# Patient Record
Sex: Female | Born: 1975 | Race: White | Hispanic: No | Marital: Married | Smoking: Former smoker
Health system: Southern US, Community
[De-identification: ages and names within clinical notes are randomized; demographics above are authoritative.]

## PROBLEM LIST (undated history)

## (undated) DIAGNOSIS — M67919 Unspecified disorder of synovium and tendon, unspecified shoulder: Secondary | ICD-10-CM

## (undated) DIAGNOSIS — F1010 Alcohol abuse, uncomplicated: Secondary | ICD-10-CM

## (undated) HISTORY — PX: ANKLE SURGERY: SHX546

## (undated) HISTORY — DX: Unspecified disorder of synovium and tendon, unspecified shoulder: M67.919

## (undated) HISTORY — PX: PARACENTESIS: SHX844

---

## 2019-09-13 ENCOUNTER — Ambulatory Visit: Payer: BLUE CROSS/BLUE SHIELD

## 2019-09-18 ENCOUNTER — Ambulatory Visit: Payer: BLUE CROSS/BLUE SHIELD

## 2020-08-09 DIAGNOSIS — L409 Psoriasis, unspecified: Secondary | ICD-10-CM | POA: Insufficient documentation

## 2020-08-14 DIAGNOSIS — K7460 Unspecified cirrhosis of liver: Secondary | ICD-10-CM

## 2020-08-14 DIAGNOSIS — R188 Other ascites: Secondary | ICD-10-CM | POA: Diagnosis present

## 2020-08-14 DIAGNOSIS — K7031 Alcoholic cirrhosis of liver with ascites: Secondary | ICD-10-CM | POA: Insufficient documentation

## 2020-08-14 DIAGNOSIS — K766 Portal hypertension: Secondary | ICD-10-CM

## 2020-08-14 DIAGNOSIS — K828 Other specified diseases of gallbladder: Secondary | ICD-10-CM | POA: Insufficient documentation

## 2020-08-14 HISTORY — DX: Unspecified cirrhosis of liver: K74.60

## 2020-08-14 HISTORY — DX: Portal hypertension: K76.6

## 2020-08-14 HISTORY — DX: Other ascites: R18.8

## 2020-08-15 DIAGNOSIS — E871 Hypo-osmolality and hyponatremia: Secondary | ICD-10-CM | POA: Diagnosis present

## 2020-08-15 HISTORY — DX: Hypo-osmolality and hyponatremia: E87.1

## 2020-08-15 LAB — HM HEPATITIS C SCREENING LAB: HM Hepatitis Screen: NEGATIVE

## 2020-08-25 DIAGNOSIS — F1020 Alcohol dependence, uncomplicated: Secondary | ICD-10-CM | POA: Insufficient documentation

## 2020-09-20 DIAGNOSIS — D638 Anemia in other chronic diseases classified elsewhere: Secondary | ICD-10-CM | POA: Insufficient documentation

## 2020-10-12 DIAGNOSIS — D696 Thrombocytopenia, unspecified: Secondary | ICD-10-CM | POA: Insufficient documentation

## 2020-10-12 DIAGNOSIS — D72829 Elevated white blood cell count, unspecified: Secondary | ICD-10-CM | POA: Diagnosis present

## 2020-10-29 ENCOUNTER — Observation Stay (HOSPITAL_COMMUNITY)
Admission: EM | Admit: 2020-10-29 | Discharge: 2020-10-30 | Disposition: A | Discharge status: Home / Self Care | Payer: Managed Care, Other (non HMO) | Attending: Internal Medicine | Admitting: Internal Medicine

## 2020-10-29 ENCOUNTER — Emergency Department (HOSPITAL_COMMUNITY): Payer: Managed Care, Other (non HMO)

## 2020-10-29 ENCOUNTER — Encounter (HOSPITAL_COMMUNITY): Payer: Self-pay | Admitting: Emergency Medicine

## 2020-10-29 ENCOUNTER — Other Ambulatory Visit: Payer: Self-pay

## 2020-10-29 DIAGNOSIS — Z9889 Other specified postprocedural states: Secondary | ICD-10-CM

## 2020-10-29 DIAGNOSIS — E876 Hypokalemia: Secondary | ICD-10-CM | POA: Diagnosis not present

## 2020-10-29 DIAGNOSIS — R0789 Other chest pain: Principal | ICD-10-CM | POA: Diagnosis present

## 2020-10-29 DIAGNOSIS — J90 Pleural effusion, not elsewhere classified: Secondary | ICD-10-CM | POA: Diagnosis not present

## 2020-10-29 DIAGNOSIS — K7460 Unspecified cirrhosis of liver: Secondary | ICD-10-CM | POA: Diagnosis not present

## 2020-10-29 DIAGNOSIS — I10 Essential (primary) hypertension: Secondary | ICD-10-CM | POA: Diagnosis not present

## 2020-10-29 DIAGNOSIS — K766 Portal hypertension: Secondary | ICD-10-CM | POA: Diagnosis present

## 2020-10-29 DIAGNOSIS — Z885 Allergy status to narcotic agent status: Secondary | ICD-10-CM | POA: Diagnosis not present

## 2020-10-29 DIAGNOSIS — D7589 Other specified diseases of blood and blood-forming organs: Secondary | ICD-10-CM | POA: Diagnosis present

## 2020-10-29 DIAGNOSIS — Z79899 Other long term (current) drug therapy: Secondary | ICD-10-CM | POA: Diagnosis not present

## 2020-10-29 DIAGNOSIS — R188 Other ascites: Secondary | ICD-10-CM | POA: Diagnosis present

## 2020-10-29 DIAGNOSIS — Z20822 Contact with and (suspected) exposure to covid-19: Secondary | ICD-10-CM | POA: Insufficient documentation

## 2020-10-29 DIAGNOSIS — D72829 Elevated white blood cell count, unspecified: Secondary | ICD-10-CM | POA: Diagnosis present

## 2020-10-29 DIAGNOSIS — E871 Hypo-osmolality and hyponatremia: Secondary | ICD-10-CM | POA: Diagnosis not present

## 2020-10-29 DIAGNOSIS — R0600 Dyspnea, unspecified: Secondary | ICD-10-CM

## 2020-10-29 DIAGNOSIS — R0602 Shortness of breath: Secondary | ICD-10-CM | POA: Diagnosis present

## 2020-10-29 HISTORY — DX: Other specified diseases of blood and blood-forming organs: D75.89

## 2020-10-29 HISTORY — DX: Alcohol abuse, uncomplicated: F10.10

## 2020-10-29 LAB — PHOSPHORUS: Phosphorus: 2.2 mg/dL — ABNORMAL LOW (ref 2.5–4.6)

## 2020-10-29 LAB — PROTIME-INR
INR: 1.5 — ABNORMAL HIGH (ref 0.8–1.2)
Prothrombin Time: 18 seconds — ABNORMAL HIGH (ref 11.4–15.2)

## 2020-10-29 LAB — COMPREHENSIVE METABOLIC PANEL
ALT: 20 U/L (ref 0–44)
AST: 60 U/L — ABNORMAL HIGH (ref 15–41)
Albumin: 3.2 g/dL — ABNORMAL LOW (ref 3.5–5.0)
Alkaline Phosphatase: 104 U/L (ref 38–126)
Anion gap: 12 (ref 5–15)
BUN: 8 mg/dL (ref 6–20)
CO2: 25 mmol/L (ref 22–32)
Calcium: 9.4 mg/dL (ref 8.9–10.3)
Chloride: 92 mmol/L — ABNORMAL LOW (ref 98–111)
Creatinine, Ser: 0.68 mg/dL (ref 0.44–1.00)
GFR, Estimated: >60 mL/min (ref >60)
Glucose, Bld: 126 mg/dL — ABNORMAL HIGH (ref 70–99)
Potassium: 3.3 mmol/L — ABNORMAL LOW (ref 3.5–5.1)
Sodium: 129 mmol/L — ABNORMAL LOW (ref 135–145)
Total Bilirubin: 10.9 mg/dL — ABNORMAL HIGH (ref 0.3–1.2)
Total Protein: 8.1 g/dL (ref 6.5–8.1)

## 2020-10-29 LAB — TROPONIN I (HIGH SENSITIVITY)
Troponin I (High Sensitivity): 2 ng/L (ref <18)
Troponin I (High Sensitivity): <2 ng/L (ref <18)

## 2020-10-29 LAB — CBC
HCT: 37 % (ref 36.0–46.0)
Hemoglobin: 12.8 g/dL (ref 12.0–15.0)
MCH: 37.3 pg — ABNORMAL HIGH (ref 26.0–34.0)
MCHC: 34.6 g/dL (ref 30.0–36.0)
MCV: 107.9 fL — ABNORMAL HIGH (ref 80.0–100.0)
Platelets: 150 10*3/uL (ref 150–400)
RBC: 3.43 MIL/uL — ABNORMAL LOW (ref 3.87–5.11)
RDW: 13.6 % (ref 11.5–15.5)
WBC: 14.6 10*3/uL — ABNORMAL HIGH (ref 4.0–10.5)
nRBC: 0 % (ref 0.0–0.2)

## 2020-10-29 LAB — I-STAT BETA HCG BLOOD, ED (MC, WL, AP ONLY): I-stat hCG, quantitative: <5 m[IU]/mL (ref <5)

## 2020-10-29 LAB — LIPASE, BLOOD: Lipase: 32 U/L (ref 11–51)

## 2020-10-29 LAB — MAGNESIUM: Magnesium: 1.1 mg/dL — ABNORMAL LOW (ref 1.7–2.4)

## 2020-10-29 MED ORDER — POTASSIUM CHLORIDE CRYS ER 10 MEQ PO TBCR: 10.0000 meq | EXTENDED_RELEASE_TABLET | Freq: Every day | ORAL | Status: DC

## 2020-10-29 MED ORDER — FUROSEMIDE 20 MG PO TABS
20.0000 mg | ORAL_TABLET | Freq: Every day | ORAL | Status: DC
  Administered 2020-10-29: 20 mg via ORAL
  Filled 2020-10-29: qty 1

## 2020-10-29 MED ORDER — MAGNESIUM SULFATE 4 GM/100ML IV SOLN
4.0000 g | Freq: Once | INTRAVENOUS | Status: AC
  Administered 2020-10-29: 4 g via INTRAVENOUS
  Filled 2020-10-29: qty 100

## 2020-10-29 MED ORDER — SPIRONOLACTONE 25 MG PO TABS
100.0000 mg | ORAL_TABLET | Freq: Every day | ORAL | Status: DC
  Administered 2020-10-29: 100 mg via ORAL
  Filled 2020-10-29: qty 4

## 2020-10-29 MED ORDER — HYDROXYZINE HCL 25 MG PO TABS: 25.0000 mg | ORAL_TABLET | Freq: Four times a day (QID) | ORAL | Status: DC | PRN

## 2020-10-29 MED ORDER — POTASSIUM CHLORIDE CRYS ER 20 MEQ PO TBCR
40.0000 meq | EXTENDED_RELEASE_TABLET | Freq: Once | ORAL | Status: AC
  Administered 2020-10-29: 40 meq via ORAL
  Filled 2020-10-29: qty 2

## 2020-10-29 MED ORDER — IOHEXOL 350 MG/ML SOLN
100.0000 mL | Freq: Once | INTRAVENOUS | Status: AC | PRN
  Administered 2020-10-29: 100 mL via INTRAVENOUS

## 2020-10-29 MED ORDER — ONDANSETRON HCL 4 MG/2ML IJ SOLN: 4.0000 mg | Freq: Four times a day (QID) | INTRAMUSCULAR | Status: DC | PRN

## 2020-10-29 MED ORDER — FENTANYL CITRATE (PF) 100 MCG/2ML IJ SOLN: 25.0000 ug | INTRAMUSCULAR | Status: DC | PRN

## 2020-10-29 MED ORDER — K PHOS MONO-SOD PHOS DI & MONO 155-852-130 MG PO TABS
500.0000 mg | ORAL_TABLET | Freq: Once | ORAL | Status: AC
  Administered 2020-10-29: 500 mg via ORAL
  Filled 2020-10-29: qty 2

## 2020-10-29 NOTE — H&P (Signed)
History and Physical    Rebecca Bonilla UXN:[id number] DOB: 1976/05/16 DOA: 10/29/2020  PCP: Lilian Coma., MD   Patient coming from: Home.   I have personally briefly reviewed patient's old medical records in [place]  Chief Complaint: Chest pain.  HPI: Rebecca Bonilla is a 45 y.o. female with medical history significant of liver cirrhosis diagnosed 2 months ago due to alcohol abuse, hyponatremia, macrocytosis, portal hypertension who is coming to the emergency department referred from the urgent care center where she went due to pleuritic, no radiated chest pain associated with nonproductive cough and dyspnea on exertion since Wednesday.  The patient denies fever or chills, but states she had night sweats last night (not sure if she was just warm).  Her appetite has not changed and is actually improved from 2 months ago.  She denies palpitations, dizziness, PND, orthopnea or lower extremities pitting edema.  She has chronic RUQ pain, occasional nausea, but denies emesis, diarrhea, constipation, melena or hematochezia.  Her urine is amber looking, but no gross hematuria, dysuria, frequency or oliguria.  Denies polyuria, polydipsia, polyphagia or blurred vision.  Since she was diagnosed with cirrhosis, the patient states that she has had 2 therapeutic paracentesis.  ED Course: Initial vital signs were temperature 98.2 F, pulse 120, respiration 18, BP 115/80 mmHg and O2 sat 99% on room air.  Lab work: CBC showed a white count of 14.6, hemoglobin 12.8 g/dL with a MCV of 107.9 fL and platelets of 150.  PT 1.5 and INR 18.Troponin x2 normal.  Sodium 129, potassium 3.3, chloride 92 and CO2 25 mmol/L.  Glucose 126 mg/dL.  Normal BUN and creatinine total protein is 8.1 and albumin 3.2 g/dL.  AST was 60 units/L.  Normal ALT and alk phos.  Total bilirubin increased from 8.8 to 8.9 mg/dL to 10.9 mg/dL.  Lipase was 32 units/L.  Calcium was 9.4, magnesium was 1.1 and phosphorus 2.2  mg/dL.  Imaging: CTA chest showed no PE, but there was a large left pleural effusion with associated atelectasis.  CT abdomen/pelvis with contrast showed cirrhosis with sequela of portal hypertension including moderate volume ascites.  Please see images and full radiology report for further detail.  Review of Systems: As per HPI otherwise all other systems reviewed and are negative.   Past Medical History:  Diagnosis Date  . Alcohol abuse   . Cirrhosis of liver with ascites ([place]) 08/14/2020  . Hyponatremia 08/15/2020  . Macrocytosis 10/29/2020  . Portal hypertension ([place]) 08/14/2020    Past Surgical History:  Procedure Laterality Date  . ANKLE SURGERY     x 2    Social History  reports that she has never smoked. She has never used smokeless tobacco. She reports previous alcohol use. She reports that she does not use drugs.  Allergies  Allergen Reactions  . Hydrocodone Nausea And Vomiting    Family History  Problem Relation Age of Onset  . Huntington's disease Mother    Prior to Admission medications   Medication Sig Start Date End Date Taking? Authorizing Provider  ergocalciferol (VITAMIN D2) 1.25 MG (50000 UT) capsule Take 50,000 Units by mouth once a week.   Yes [provider]  furosemide (LASIX) 20 MG tablet Take 20 mg by mouth.   Yes [provider]  potassium chloride (KLOR-CON) 10 MEQ tablet Take 10 mEq by mouth daily.   Yes [provider]  spironolactone (ALDACTONE) 100 MG tablet Take 100 mg by mouth daily.   Yes  [provider]   Physical Exam: Vitals:   10/29/20 1730 10/29/20 1745 10/29/20 1800 10/29/20 1815  BP:  121/83 110/71 114/77  Pulse: (!) 110 (!) 112 (!) 108 (!) 107  Resp: 16 (!) 23 (!) 21 20  Temp:      TempSrc:      SpO2: 96% 98% 98% 98%  Weight:      Height:       Constitutional: Looks chronically ill, but in NAD, calm, comfortable. Eyes: PERRL, mildly injected lids and conjunctivae normal.  Icteric  sclerae. ENMT: Mucous membranes are moist. Posterior pharynx clear of any exudate or lesions. Neck: normal, supple, no masses, no thyromegaly Respiratory: Decreased breath sounds on left middle and lower lung fields, no wheezing, no crackles. Normal respiratory effort. No accessory muscle use.  Cardiovascular: Tachycardic with a regular rhythm 104 bpm, no murmurs / rubs / gallops. No extremity edema. 2+ pedal pulses. No carotid bruits.  Abdomen: Moderate ascites.  Bowel sounds positive.  Positive RUQ tenderness, no guarding or rebound, no masses palpated. No hepatosplenomegaly. Musculoskeletal: Mild generalized weakness.  No clubbing / cyanosis. Good ROM, no contractures. Normal muscle tone.  Skin: Positive icterus.  Some excoriations on her neck and extremities. Neurologic: CN 2-12 grossly intact. Sensation intact, DTR normal. Strength 5/5 in all 4.  Psychiatric: Normal judgment and insight. Alert and oriented x 3. Normal mood.   Labs on Admission: I have personally reviewed following labs and imaging studies  CBC: Recent Labs  Lab 10/29/20 1620  WBC 14.6*  HGB 12.8  HCT 37.0  MCV 107.9*  PLT 098    Basic Metabolic Panel: Recent Labs  Lab 10/29/20 1642  NA 129*  K 3.3*  CL 92*  CO2 25  GLUCOSE 126*  BUN 8  CREATININE 0.68  CALCIUM 9.4    GFR: Estimated Creatinine Clearance: 88.6 mL/min (by C-G formula based on SCr of 0.68 mg/dL).  Liver Function Tests: Recent Labs  Lab 10/29/20 1642  AST 60*  ALT 20  ALKPHOS 104  BILITOT 10.9*  PROT 8.1  ALBUMIN 3.2*    Urine analysis: No results found for: COLORURINE, APPEARANCEUR, LABSPEC, PHURINE, GLUCOSEU, HGBUR, BILIRUBINUR, KETONESUR, PROTEINUR, UROBILINOGEN, NITRITE, LEUKOCYTESUR  Radiological Exams on Admission: CT Angio Chest PE W/Cm &/Or Wo Cm  Result Date: 10/29/2020 CLINICAL DATA:  Cough, abdominal distension, jaundice. EXAM: CT ANGIOGRAPHY CHEST CT ABDOMEN AND PELVIS WITH CONTRAST TECHNIQUE: Multidetector CT  imaging of the chest was performed using the standard protocol during bolus administration of intravenous contrast. Multiplanar CT image reconstructions and MIPs were obtained to evaluate the vascular anatomy. Multidetector CT imaging of the abdomen and pelvis was performed using the standard protocol during bolus administration of intravenous contrast. CONTRAST:  162m OMNIPAQUE IOHEXOL 350 MG/ML SOLN COMPARISON:  None. FINDINGS: CTA CHEST FINDINGS Cardiovascular: Satisfactory opacification of the pulmonary arteries to the segmental level. No evidence of pulmonary embolism. Normal heart size. No pericardial effusion. Mediastinum/Nodes: No enlarged mediastinal, hilar, or axillary lymph nodes. Thyroid gland, trachea, and esophagus demonstrate no significant findings. Lungs/Pleura: There is are large left pleural effusion with associated atelectasis. The right lung is clear with no pleural effusion. There is no pneumothorax. Musculoskeletal: No chest wall abnormality. No acute or significant osseous findings. Review of the MIP images confirms the above findings. CT ABDOMEN and PELVIS FINDINGS Hepatobiliary: The liver has a nodular surface contour. No focal liver abnormality is seen. No gallstones, gallbladder wall thickening, or biliary dilatation. Pancreas: Unremarkable. No pancreatic ductal dilatation or surrounding inflammatory changes.  Spleen: Borderline enlarged size without focal abnormality. Adrenals/Urinary Tract: Adrenal glands are unremarkable. Kidneys are normal, without renal calculi, focal lesion, or hydronephrosis. Bladder is empty. Stomach/Bowel: Stomach is within normal limits. No pericecal inflammatory changes are noted to suggest acute appendicitis. No evidence of bowel wall thickening, distention, or inflammatory changes. Vascular/Lymphatic: Portosystemic shunts are noted, including perisplenic and periumbilical collaterals. No enlarged abdominal or pelvic lymph nodes. Reproductive: Uterus and  bilateral adnexa are unremarkable. Other: There is moderate volume ascites. No abdominal wall hernia is identified. Musculoskeletal: No acute or significant osseous findings. Review of the MIP images confirms the above findings. IMPRESSION: 1. No evidence of pulmonary embolism. 2. Large left pleural effusion with associated atelectasis. 3. Cirrhosis and sequela of portal hypertension including moderate volume ascites. Electronically Signed   By: Zerita Boers M.D.   On: 10/29/2020 18:13   CT Abdomen Pelvis W Contrast  Result Date: 10/29/2020 CLINICAL DATA:  Cough, abdominal distension, jaundice. EXAM: CT ANGIOGRAPHY CHEST CT ABDOMEN AND PELVIS WITH CONTRAST TECHNIQUE: Multidetector CT imaging of the chest was performed using the standard protocol during bolus administration of intravenous contrast. Multiplanar CT image reconstructions and MIPs were obtained to evaluate the vascular anatomy. Multidetector CT imaging of the abdomen and pelvis was performed using the standard protocol during bolus administration of intravenous contrast. CONTRAST:  155m OMNIPAQUE IOHEXOL 350 MG/ML SOLN COMPARISON:  None. FINDINGS: CTA CHEST FINDINGS Cardiovascular: Satisfactory opacification of the pulmonary arteries to the segmental level. No evidence of pulmonary embolism. Normal heart size. No pericardial effusion. Mediastinum/Nodes: No enlarged mediastinal, hilar, or axillary lymph nodes. Thyroid gland, trachea, and esophagus demonstrate no significant findings. Lungs/Pleura: There is are large left pleural effusion with associated atelectasis. The right lung is clear with no pleural effusion. There is no pneumothorax. Musculoskeletal: No chest wall abnormality. No acute or significant osseous findings. Review of the MIP images confirms the above findings. CT ABDOMEN and PELVIS FINDINGS Hepatobiliary: The liver has a nodular surface contour. No focal liver abnormality is seen. No gallstones, gallbladder wall thickening, or  biliary dilatation. Pancreas: Unremarkable. No pancreatic ductal dilatation or surrounding inflammatory changes. Spleen: Borderline enlarged size without focal abnormality. Adrenals/Urinary Tract: Adrenal glands are unremarkable. Kidneys are normal, without renal calculi, focal lesion, or hydronephrosis. Bladder is empty. Stomach/Bowel: Stomach is within normal limits. No pericecal inflammatory changes are noted to suggest acute appendicitis. No evidence of bowel wall thickening, distention, or inflammatory changes. Vascular/Lymphatic: Portosystemic shunts are noted, including perisplenic and periumbilical collaterals. No enlarged abdominal or pelvic lymph nodes. Reproductive: Uterus and bilateral adnexa are unremarkable. Other: There is moderate volume ascites. No abdominal wall hernia is identified. Musculoskeletal: No acute or significant osseous findings. Review of the MIP images confirms the above findings. IMPRESSION: 1. No evidence of pulmonary embolism. 2. Large left pleural effusion with associated atelectasis. 3. Cirrhosis and sequela of portal hypertension including moderate volume ascites. Electronically Signed   By: TZerita BoersM.D.   On: 10/29/2020 18:13    EKG: Independently reviewed.  Vent. rate 116 BPM PR interval 125 ms QRS duration 83 ms QT/QTcB 340/473 ms P-R-T axes 65 43 29 Sinus tachycardia Paired ventricular premature complexes Consider right atrial enlargement Low voltage, precordial leads 12 Lead; [place]  Assessment/Plan Principal Problem:   Atypical chest pain In the setting of   Pleural effusion on left Observation/telemetry. Analgesics as needed (fentanyl 25 mcg IVP every 2 hours PRN) Zofran 4 mg IVP every 6 hours PRN. Incentive spirometry as tolerated. Consult IR in a.m. for imaging guided  thoracentesis. May need IR guided paracentesis as well.  Active Problems:   Cirrhosis of liver with ascites ([facility]) Continue furosemide 20 mg p.o. nightly. Continue  regular KCl supplementation. Continue spironolactone 100 mg p.o. nightly. Added hydroxyzine 25 mg PO q6 hr PRN for pruritus.    Portal hypertension ([place]) As above. Follow-up with hepatology.    Hyponatremia Due to liver cirrhosis with ascites/chronic diuretic use.    Hypokalemia  KCl 40 mEq p.o. x1 dose. Continue KCl 10 mEq p.o. nightly tomorrow. Supplement magnesium and potassium.    Hypomagnesemia Magnesium sulfate 2 g IVPB x1 dose.    Hypophosphatemia K-Phos 500 mg p.o. x1 dose. Follow-up potassium level as needed.    Leukocytosis Around baseline. No fever, chills or other active infection signs.    Macrocytosis Had normal R91 and folic acid level a month ago.   DVT prophylaxis: SCDs. Code Status:   Full code. Family Communication:   Disposition Plan:   Patient is from:  Home.  Anticipated DC to:  Home.  Anticipated DC date:  10/30/2020.  Anticipated DC barriers: Clinical status/consultant sign off. Consults called: Admission status:  Observation/telemetry.  Severity of Illness:  High severity due to new left-sided pleural effusion producing atelectasis, pleuritic chest pain, tachycardia and dyspnea on exertion.  The patient will need to remain in the hospital for symptoms management and therapeutic thoracentesis.    Reubin Milan MD [facility]  How to contact the [facility] Attending or Consulting provider [place] or covering provider during after hours [place], for this patient?   1. Check the care team in [facility] and look for a) attending/consulting [facility] provider listed and b) the [facility] team listed 2. Log into [email or web address] and use [facility]'s universal password to access. If you do not have the password, please contact the hospital operator. 3. Locate the [facility] provider you are looking for under [facility] and page to a number that you can be directly reached. 4. If you still have difficulty reaching the provider, please page the [facility] (Director on Call)  for the Hospitalists listed on amion for assistance.  10/29/2020, 7:45 PM   This document was prepared using Dragon voice recognition software and may contain some unintended transcription errors.

## 2020-10-29 NOTE — ED Provider Notes (Signed)
[facility]-EMERGENCY DEPT Provider Note   CSN: [id number] Arrival date & time: 10/29/20  1612     History Chief Complaint  Patient presents with  . Chest Pain    Rebecca Bonilla is a 45 y.o. female.  The history is provided by the patient and medical records.  Chest Pain  Rebecca Bonilla is a 45 y.o. female who presents to the Emergency Department complaining of chest pain. She presents the emergency department complaining of left sided chest pain and coughing that is been present since Wednesday. She went to urgent care earlier today and had a chest x-ray performed that demonstrated a left pleural effusion and she was referred to the emergency department. She does have shortness of breath. She had one episode of night sweats last night. She was diagnosed with cirrhosis in February of this year. She quit drinking alcohol in January. She has no additional medical problems. She takes spironolactone and Lasix. No fevers, nausea, vomiting, diarrhea, leg swelling or pain.    Past Medical History:  Diagnosis Date  . Alcohol abuse   . 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  . Hyponatremia 08/15/2020  . Macrocytosis 10/29/2020  . Portal hypertension (HCC) 08/14/2020    Patient Active Problem List   Diagnosis Date Noted  . Atypical chest pain 10/29/2020  . Pleural effusion on left 10/29/2020  . Macrocytosis 10/29/2020  . Hypokalemia 10/29/2020  . Leukocytosis 10/12/2020  . Hyponatremia 08/15/2020  . 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  . Portal hypertension (HCC) 08/14/2020  . Psoriasis 08/09/2020    History reviewed. No pertinent surgical history.   OB History   No obstetric history on file.     History reviewed. No pertinent family history.  Social History   Tobacco Use  . Smoking status: Never Smoker  . Smokeless tobacco: Never Used  Substance Use Topics  . Alcohol use: Never  . Drug use: Never    Home  Medications Prior to Admission medications   Not on File    Allergies    Patient has no allergy information on record.  Review of Systems   Review of Systems  Cardiovascular: Positive for chest pain.  All other systems reviewed and are negative.   Physical Exam Updated Vital Signs BP 114/77   Pulse (!) 107   Temp 98.2 F (36.8 C) (Oral)   Resp 20   Ht 5\' 8"  (1.727 m)   Wt 69.4 kg   SpO2 98%   BMI 23.26 kg/m   Physical Exam Vitals and nursing note reviewed.  Constitutional:      Appearance: She is well-developed.  HENT:     Head: Normocephalic and atraumatic.  Eyes:     General: Scleral icterus present.  Cardiovascular:     Rate and Rhythm: Regular rhythm. Tachycardia present.     Heart sounds: No murmur heard.   Pulmonary:     Effort: Pulmonary effort is normal. No respiratory distress.     Comments: Decreased air movement in the left lung base Abdominal:     Palpations: Abdomen is soft.     Tenderness: There is no abdominal tenderness. There is no guarding or rebound.  Musculoskeletal:        General: No swelling or tenderness.  Skin:    General: Skin is warm and dry.  Neurological:     Mental Status: She is alert and oriented to person, place, and time.  Psychiatric:  Behavior: Behavior normal.     ED Results / Procedures / Treatments   Labs (all labs ordered are listed, but only abnormal results are displayed) Labs Reviewed  CBC - Abnormal; Notable for the following components:      Result Value   WBC 14.6 (*)    RBC 3.43 (*)    MCV 107.9 (*)    MCH 37.3 (*)    All other components within normal limits  COMPREHENSIVE METABOLIC PANEL - Abnormal; Notable for the following components:   Sodium 129 (*)    Potassium 3.3 (*)    Chloride 92 (*)    Glucose, Bld 126 (*)    Albumin 3.2 (*)    AST 60 (*)    Total Bilirubin 10.9 (*)    All other components within normal limits  PROTIME-INR - Abnormal; Notable for the following components:    Prothrombin Time 18.0 (*)    INR 1.5 (*)    All other components within normal limits  SARS CORONAVIRUS 2 (TAT 6-24 HRS)  LIPASE, BLOOD  HIV ANTIBODY (ROUTINE TESTING W REFLEX)  COMPREHENSIVE METABOLIC PANEL  CBC  MAGNESIUM  PHOSPHORUS  I-STAT BETA HCG BLOOD, ED ([facility], [facility], [facility] ONLY)  TROPONIN I (HIGH SENSITIVITY)  TROPONIN I (HIGH SENSITIVITY)    EKG EKG Interpretation  Date/Time:  Sunday October 29 2020 16:21:02 EDT Ventricular Rate:  116 PR Interval:  125 QRS Duration: 83 QT Interval:  340 QTC Calculation: 473 R Axis:   43 Text Interpretation: Sinus tachycardia Paired ventricular premature complexes Consider right atrial enlargement Low voltage, precordial leads 12 Lead; Mason-Likar Confirmed by Tilden Fossa [phone]) on 10/29/2020 4:25:48 PM   Radiology CT Angio Chest PE W/Cm &/Or Wo Cm  Result Date: 10/29/2020 CLINICAL DATA:  Cough, abdominal distension, jaundice. EXAM: CT ANGIOGRAPHY CHEST CT ABDOMEN AND PELVIS WITH CONTRAST TECHNIQUE: Multidetector CT imaging of the chest was performed using the standard protocol during bolus administration of intravenous contrast. Multiplanar CT image reconstructions and MIPs were obtained to evaluate the vascular anatomy. Multidetector CT imaging of the abdomen and pelvis was performed using the standard protocol during bolus administration of intravenous contrast. CONTRAST:  OMNIPAQUE IOHEXOL 350 MG/ML SOLN COMPARISON:  None. FINDINGS: CTA CHEST FINDINGS Cardiovascular: Satisfactory opacification of the pulmonary arteries to the segmental level. No evidence of pulmonary embolism. Normal heart size. No pericardial effusion. Mediastinum/Nodes: No enlarged mediastinal, hilar, or axillary lymph nodes. Thyroid gland, trachea, and esophagus demonstrate no significant findings. Lungs/Pleura: There is are large left pleural effusion with associated atelectasis. The right lung is clear with no pleural effusion. There is no pneumothorax.  Musculoskeletal: No chest wall abnormality. No acute or significant osseous findings. Review of the MIP images confirms the above findings. CT ABDOMEN and PELVIS FINDINGS Hepatobiliary: The liver has a nodular surface contour. No focal liver abnormality is seen. No gallstones, gallbladder wall thickening, or biliary dilatation. Pancreas: Unremarkable. No pancreatic ductal dilatation or surrounding inflammatory changes. Spleen: Borderline enlarged size without focal abnormality. Adrenals/Urinary Tract: Adrenal glands are unremarkable. Kidneys are normal, without renal calculi, focal lesion, or hydronephrosis. Bladder is empty. Stomach/Bowel: Stomach is within normal limits. No pericecal inflammatory changes are noted to suggest acute appendicitis. No evidence of bowel wall thickening, distention, or inflammatory changes. Vascular/Lymphatic: Portosystemic shunts are noted, including perisplenic and periumbilical collaterals. No enlarged abdominal or pelvic lymph nodes. Reproductive: Uterus and bilateral adnexa are unremarkable. Other: There is moderate volume ascites. No abdominal wall hernia is identified. Musculoskeletal: No acute or significant osseous findings.  Review of the MIP images confirms the above findings. IMPRESSION: 1. No evidence of pulmonary embolism. 2. Large left pleural effusion with associated atelectasis. 3. Cirrhosis and sequela of portal hypertension including moderate volume ascites. Electronically Signed   By: Romona Curlsyler  Litton M.D.   On: 10/29/2020 18:13   CT Abdomen Pelvis W Contrast  Result Date: 10/29/2020 CLINICAL DATA:  Cough, abdominal distension, jaundice. EXAM: CT ANGIOGRAPHY CHEST CT ABDOMEN AND PELVIS WITH CONTRAST TECHNIQUE: Multidetector CT imaging of the chest was performed using the standard protocol during bolus administration of intravenous contrast. Multiplanar CT image reconstructions and MIPs were obtained to evaluate the vascular anatomy. Multidetector CT imaging of the  abdomen and pelvis was performed using the standard protocol during bolus administration of intravenous contrast. CONTRAST:  100mL OMNIPAQUE IOHEXOL 350 MG/ML SOLN COMPARISON:  None. FINDINGS: CTA CHEST FINDINGS Cardiovascular: Satisfactory opacification of the pulmonary arteries to the segmental level. No evidence of pulmonary embolism. Normal heart size. No pericardial effusion. Mediastinum/Nodes: No enlarged mediastinal, hilar, or axillary lymph nodes. Thyroid gland, trachea, and esophagus demonstrate no significant findings. Lungs/Pleura: There is are large left pleural effusion with associated atelectasis. The right lung is clear with no pleural effusion. There is no pneumothorax. Musculoskeletal: No chest wall abnormality. No acute or significant osseous findings. Review of the MIP images confirms the above findings. CT ABDOMEN and PELVIS FINDINGS Hepatobiliary: The liver has a nodular surface contour. No focal liver abnormality is seen. No gallstones, gallbladder wall thickening, or biliary dilatation. Pancreas: Unremarkable. No pancreatic ductal dilatation or surrounding inflammatory changes. Spleen: Borderline enlarged size without focal abnormality. Adrenals/Urinary Tract: Adrenal glands are unremarkable. Kidneys are normal, without renal calculi, focal lesion, or hydronephrosis. Bladder is empty. Stomach/Bowel: Stomach is within normal limits. No pericecal inflammatory changes are noted to suggest acute appendicitis. No evidence of bowel wall thickening, distention, or inflammatory changes. Vascular/Lymphatic: Portosystemic shunts are noted, including perisplenic and periumbilical collaterals. No enlarged abdominal or pelvic lymph nodes. Reproductive: Uterus and bilateral adnexa are unremarkable. Other: There is moderate volume ascites. No abdominal wall hernia is identified. Musculoskeletal: No acute or significant osseous findings. Review of the MIP images confirms the above findings. IMPRESSION: 1. No  evidence of pulmonary embolism. 2. Large left pleural effusion with associated atelectasis. 3. Cirrhosis and sequela of portal hypertension including moderate volume ascites. Electronically Signed   By: Romona Curlsyler  Litton M.D.   On: 10/29/2020 18:13    Procedures Procedures   Medications Ordered in ED Medications  potassium chloride SA (KLOR-CON) CR tablet 40 mEq (has no administration in time range)  iohexol (OMNIPAQUE) 350 MG/ML injection 100 mL (100 mLs Intravenous Contrast Given 10/29/20 1730)    ED Course  I have reviewed the triage vital signs and the nursing notes.  Pertinent labs & imaging results that were available during my care of the patient were reviewed by me and considered in my medical decision making (see chart for details).    MDM Rules/Calculators/A&P                         patient here for evaluation of left sided chest pain, had pleural effusion on outpatient chest x-ray. She is tachycardic on evaluation. Bilirubin is elevated, slightly up trending from prior. CBC with mild leukocytosis. Initial concern for possible PE as well and a CTA was obtained. CT is negative for PE but does demonstrate a large left-sided pleural effusion as well as moderate ascites. Given persistent tachycardia, new onset pleural effusion recommend  observation admission for further workup and management. Hospitalist consulted for admission.  Final Clinical Impression(s) / ED Diagnoses Final diagnoses:  Pleural effusion  Other chest pain    Rx / DC Orders ED Discharge Orders    None       Tilden Fossa, MD 10/29/20 [phone]

## 2020-10-29 NOTE — ED Triage Notes (Signed)
Patient here from home reporting left sided chest pain radiating up into left shoulder since Wednesday. Reports that she was seen at the Urgent Care for chest x-ray and advised to come here for CT.

## 2020-10-30 ENCOUNTER — Other Ambulatory Visit (HOSPITAL_COMMUNITY): Payer: Managed Care, Other (non HMO)

## 2020-10-30 ENCOUNTER — Observation Stay (HOSPITAL_COMMUNITY): Payer: Managed Care, Other (non HMO)

## 2020-10-30 DIAGNOSIS — R0789 Other chest pain: Secondary | ICD-10-CM | POA: Diagnosis not present

## 2020-10-30 DIAGNOSIS — K7031 Alcoholic cirrhosis of liver with ascites: Secondary | ICD-10-CM | POA: Diagnosis not present

## 2020-10-30 DIAGNOSIS — E876 Hypokalemia: Secondary | ICD-10-CM | POA: Diagnosis not present

## 2020-10-30 DIAGNOSIS — E871 Hypo-osmolality and hyponatremia: Secondary | ICD-10-CM

## 2020-10-30 DIAGNOSIS — J90 Pleural effusion, not elsewhere classified: Secondary | ICD-10-CM

## 2020-10-30 DIAGNOSIS — D7589 Other specified diseases of blood and blood-forming organs: Secondary | ICD-10-CM

## 2020-10-30 DIAGNOSIS — D72829 Elevated white blood cell count, unspecified: Secondary | ICD-10-CM

## 2020-10-30 DIAGNOSIS — K766 Portal hypertension: Secondary | ICD-10-CM

## 2020-10-30 DIAGNOSIS — R0602 Shortness of breath: Secondary | ICD-10-CM | POA: Diagnosis present

## 2020-10-30 LAB — COMPREHENSIVE METABOLIC PANEL
ALT: 18 U/L (ref 0–44)
AST: 50 U/L — ABNORMAL HIGH (ref 15–41)
Albumin: 2.7 g/dL — ABNORMAL LOW (ref 3.5–5.0)
Alkaline Phosphatase: 95 U/L (ref 38–126)
Anion gap: 9 (ref 5–15)
BUN: 7 mg/dL (ref 6–20)
CO2: 26 mmol/L (ref 22–32)
Calcium: 9 mg/dL (ref 8.9–10.3)
Chloride: 97 mmol/L — ABNORMAL LOW (ref 98–111)
Creatinine, Ser: 0.76 mg/dL (ref 0.44–1.00)
GFR, Estimated: >60 mL/min (ref >60)
Glucose, Bld: 105 mg/dL — ABNORMAL HIGH (ref 70–99)
Potassium: 4.1 mmol/L (ref 3.5–5.1)
Sodium: 132 mmol/L — ABNORMAL LOW (ref 135–145)
Total Bilirubin: 8.8 mg/dL — ABNORMAL HIGH (ref 0.3–1.2)
Total Protein: 6.6 g/dL (ref 6.5–8.1)

## 2020-10-30 LAB — LACTATE DEHYDROGENASE, PLEURAL OR PERITONEAL FLUID: LD, Fluid: 174 U/L — ABNORMAL HIGH (ref 3–23)

## 2020-10-30 LAB — CBC
HCT: 31.8 % — ABNORMAL LOW (ref 36.0–46.0)
Hemoglobin: 11 g/dL — ABNORMAL LOW (ref 12.0–15.0)
MCH: 37 pg — ABNORMAL HIGH (ref 26.0–34.0)
MCHC: 34.6 g/dL (ref 30.0–36.0)
MCV: 107.1 fL — ABNORMAL HIGH (ref 80.0–100.0)
Platelets: 128 10*3/uL — ABNORMAL LOW (ref 150–400)
RBC: 2.97 MIL/uL — ABNORMAL LOW (ref 3.87–5.11)
RDW: 13.4 % (ref 11.5–15.5)
WBC: 12.8 10*3/uL — ABNORMAL HIGH (ref 4.0–10.5)
nRBC: 0 % (ref 0.0–0.2)

## 2020-10-30 LAB — BODY FLUID CELL COUNT WITH DIFFERENTIAL
Eos, Fluid: 2 %
Lymphs, Fluid: 19 %
Monocyte-Macrophage-Serous Fluid: 42 % — ABNORMAL LOW (ref 50–90)
Neutrophil Count, Fluid: 37 % — ABNORMAL HIGH (ref 0–25)
Total Nucleated Cell Count, Fluid: 990 cu mm (ref 0–1000)

## 2020-10-30 LAB — PROTEIN, PLEURAL OR PERITONEAL FLUID: Total protein, fluid: 4.3 g/dL

## 2020-10-30 LAB — GLUCOSE, PLEURAL OR PERITONEAL FLUID: Glucose, Fluid: 89 mg/dL

## 2020-10-30 LAB — MAGNESIUM: Magnesium: 2.5 mg/dL — ABNORMAL HIGH (ref 1.7–2.4)

## 2020-10-30 LAB — PHOSPHORUS: Phosphorus: 5.3 mg/dL — ABNORMAL HIGH (ref 2.5–4.6)

## 2020-10-30 LAB — SARS CORONAVIRUS 2 (TAT 6-24 HRS): SARS Coronavirus 2: NEGATIVE

## 2020-10-30 MED ORDER — LIDOCAINE HCL 1 % IJ SOLN
INTRAMUSCULAR | Status: AC
  Filled 2020-10-30: qty 20

## 2020-10-30 NOTE — Progress Notes (Incomplete)
PROGRESS NOTE    Rebecca Bonilla  ZOX:[id number] DOB: 02-Jul-1976 DOA: 10/29/2020 PCP: Malka So., MD   Brief Narrative:   Assessment & Plan:   Principal Problem:   Atypical chest pain Active Problems:   Cirrhosis of liver with ascites (HCC)   Hyponatremia   Leukocytosis   Portal hypertension (HCC)   Pleural effusion on left   Macrocytosis   Hypokalemia   Hypophosphatemia   Hypomagnesemia    Atypical chest pain In the setting of   Pleural effusion on left Observation/telemetry. Analgesics as needed (fentanyl 25 mcg IVP every 2 hours PRN) Zofran 4 mg IVP every 6 hours PRN. Incentive spirometry as tolerated. Consult IR in a.m. for imaging guided thoracentesis. May need IR guided paracentesis as well.  Active Problems:   Cirrhosis of liver with ascites (HCC) Continue furosemide 20 mg p.o. nightly. Continue regular KCl supplementation. Continue spironolactone 100 mg p.o. nightly. Added hydroxyzine 25 mg PO q6 hr PRN for pruritus.    Portal hypertension (HCC) As above. Follow-up with hepatology.    Hyponatremia Due to liver cirrhosis with ascites/chronic diuretic use.    Hypokalemia  KCl 40 mEq p.o. x1 dose. Continue KCl 10 mEq p.o. nightly tomorrow. Supplement magnesium and potassium.    Hypomagnesemia Magnesium sulfate 2 g IVPB x1 dose.    Hypophosphatemia K-Phos 500 mg p.o. x1 dose. Follow-up potassium level as needed.    Leukocytosis Around baseline. No fever, chills or other active infection signs.    Macrocytosis Had normal B12 and folic acid level a month ago.  ***   DVT prophylaxis: (Lovenox/Heparin/SCD's/anticoagulated/None (if comfort care) Code Status: (Full/Partial - specify details) Family Communication: (Specify name, relationship & date discussed. NO "discussed with patient") Disposition Plan: (specify when and where you expect patient to be discharged). Include barriers to DC in this tab.  Status is:  Observation  {Observation:23811}  Dispo: The patient is from: {From:23814}              Anticipated d/c is to: {To:23815}              Patient currently {Medically stable:23817}   Difficult to place patient {Yes/No:25151}        Consultants:   ***  Procedures: (Don't include imaging studies which can be auto populated. Include things that cannot be auto populated i.e. Echo, Carotid and venous dopplers, Foley, Bipap, HD, tubes/drains, wound vac, central lines etc)  ***  Antimicrobials: (specify start and planned stop date. Auto populated tables are space occupying and do not give end dates) Anti-infectives (From admission, onward)   None        Subjective: ***  Objective: Vitals:   10/29/20 2015 10/29/20 2059 10/30/20 0202 10/30/20 0544  BP: 113/78 125/87 113/72 104/69  Pulse: (!) 108 (!) 103 96 96  Resp: 20 18 13 18   Temp:  98.3 F (36.8 C) 98.3 F (36.8 C) 98.6 F (37 C)  TempSrc:   Oral   SpO2: 96% 96% 97% 96%  Weight:      Height:        Intake/Output Summary (Last 24 hours) at 10/30/2020 0900 Last data filed at 10/30/2020 0200 Gross per 24 hour  Intake 100 ml  Output -  Net 100 ml   Filed Weights   10/29/20 1621  Weight: 69.4 kg    Examination: Physical Exam:  Constitutional: WN/WD, NAD and appears calm and comfortable Eyes: PERRL, lids and conjunctivae normal, sclerae anicteric  ENMT: External Ears, Nose appear normal. Grossly normal  hearing. Mucous membranes are moist. Posterior pharynx clear of any exudate or lesions. Normal dentition.  Neck: Appears normal, supple, no cervical masses, normal ROM, no appreciable thyromegaly Respiratory: Clear to auscultation bilaterally, no wheezing, rales, rhonchi or crackles. Normal respiratory effort and patient is not tachypenic. No accessory muscle use.  Cardiovascular: RRR, no murmurs / rubs / gallops. S1 and S2 auscultated. No extremity edema. 2+ pedal pulses. No carotid bruits.  Abdomen: Soft,  non-tender, non-distended. No masses palpated. No appreciable hepatosplenomegaly. Bowel sounds positive.  GU: Deferred. Musculoskeletal: No clubbing / cyanosis of digits/nails. No joint deformity upper and lower extremities. Good ROM, no contractures. Normal strength and muscle tone.  Skin: No rashes, lesions, ulcers. No induration; Warm and dry.  Neurologic: CN 2-12 grossly intact with no focal deficits. Sensation intact in all 4 Extremities, DTR normal. Strength 5/5 in all 4. Romberg sign cerebellar reflexes not assessed.  Psychiatric: Normal judgment and insight. Alert and oriented x 3. Normal mood and appropriate affect.   Data Reviewed: I have personally reviewed following labs and imaging studies  CBC: Recent Labs  Lab 10/29/20 1620 10/30/20 0508  WBC 14.6* 12.8*  HGB 12.8 11.0*  HCT 37.0 31.8*  MCV 107.9* 107.1*  PLT 150 128*   Basic Metabolic Panel: Recent Labs  Lab 10/29/20 1642 10/29/20 1848 10/30/20 0508  NA 129*  --  132*  K 3.3*  --  4.1  CL 92*  --  97*  CO2 25  --  26  GLUCOSE 126*  --  105*  BUN 8  --  7  CREATININE 0.68  --  0.76  CALCIUM 9.4  --  9.0  MG  --  1.1* 2.5*  PHOS  --  2.2* 5.3*   GFR: Estimated Creatinine Clearance: 88.6 mL/min (by C-G formula based on SCr of 0.76 mg/dL). Liver Function Tests: Recent Labs  Lab 10/29/20 1642 10/30/20 0508  AST 60* 50*  ALT 20 18  ALKPHOS 104 95  BILITOT 10.9* 8.8*  PROT 8.1 6.6  ALBUMIN 3.2* 2.7*   Recent Labs  Lab 10/29/20 1642  LIPASE 32   No results for input(s): AMMONIA in the last 168 hours. Coagulation Profile: Recent Labs  Lab 10/29/20 1642  INR 1.5*   Cardiac Enzymes: No results for input(s): CKTOTAL, CKMB, CKMBINDEX, TROPONINI in the last 168 hours. BNP (last 3 results) No results for input(s): PROBNP in the last 8760 hours. HbA1C: No results for input(s): HGBA1C in the last 72 hours. CBG: No results for input(s): GLUCAP in the last 168 hours. Lipid Profile: No results for  input(s): CHOL, HDL, LDLCALC, TRIG, CHOLHDL, LDLDIRECT in the last 72 hours. Thyroid Function Tests: No results for input(s): TSH, T4TOTAL, FREET4, T3FREE, THYROIDAB in the last 72 hours. Anemia Panel: No results for input(s): VITAMINB12, FOLATE, FERRITIN, TIBC, IRON, RETICCTPCT in the last 72 hours. Sepsis Labs: No results for input(s): PROCALCITON, LATICACIDVEN in the last 168 hours.  No results found for this or any previous visit (from the past 240 hour(s)).   RN Pressure Injury Documentation:     Estimated body mass index is 23.26 kg/m as calculated from the following:   Height as of this encounter: 5\' 8"  (1.727 m).   Weight as of this encounter: 69.4 kg.  Malnutrition Type:   Malnutrition Characteristics:   Nutrition Interventions:     Radiology Studies: CT Angio Chest PE W/Cm &/Or Wo Cm  Result Date: 10/29/2020 CLINICAL DATA:  Cough, abdominal distension, jaundice. EXAM: CT ANGIOGRAPHY CHEST CT  ABDOMEN AND PELVIS WITH CONTRAST TECHNIQUE: Multidetector CT imaging of the chest was performed using the standard protocol during bolus administration of intravenous contrast. Multiplanar CT image reconstructions and MIPs were obtained to evaluate the vascular anatomy. Multidetector CT imaging of the abdomen and pelvis was performed using the standard protocol during bolus administration of intravenous contrast. CONTRAST:  OMNIPAQUE IOHEXOL 350 MG/ML SOLN COMPARISON:  None. FINDINGS: CTA CHEST FINDINGS Cardiovascular: Satisfactory opacification of the pulmonary arteries to the segmental level. No evidence of pulmonary embolism. Normal heart size. No pericardial effusion. Mediastinum/Nodes: No enlarged mediastinal, hilar, or axillary lymph nodes. Thyroid gland, trachea, and esophagus demonstrate no significant findings. Lungs/Pleura: There is are large left pleural effusion with associated atelectasis. The right lung is clear with no pleural effusion. There is no pneumothorax.  Musculoskeletal: No chest wall abnormality. No acute or significant osseous findings. Review of the MIP images confirms the above findings. CT ABDOMEN and PELVIS FINDINGS Hepatobiliary: The liver has a nodular surface contour. No focal liver abnormality is seen. No gallstones, gallbladder wall thickening, or biliary dilatation. Pancreas: Unremarkable. No pancreatic ductal dilatation or surrounding inflammatory changes. Spleen: Borderline enlarged size without focal abnormality. Adrenals/Urinary Tract: Adrenal glands are unremarkable. Kidneys are normal, without renal calculi, focal lesion, or hydronephrosis. Bladder is empty. Stomach/Bowel: Stomach is within normal limits. No pericecal inflammatory changes are noted to suggest acute appendicitis. No evidence of bowel wall thickening, distention, or inflammatory changes. Vascular/Lymphatic: Portosystemic shunts are noted, including perisplenic and periumbilical collaterals. No enlarged abdominal or pelvic lymph nodes. Reproductive: Uterus and bilateral adnexa are unremarkable. Other: There is moderate volume ascites. No abdominal wall hernia is identified. Musculoskeletal: No acute or significant osseous findings. Review of the MIP images confirms the above findings. IMPRESSION: 1. No evidence of pulmonary embolism. 2. Large left pleural effusion with associated atelectasis. 3. Cirrhosis and sequela of portal hypertension including moderate volume ascites. Electronically Signed   By: Romona Curls M.D.   On: 10/29/2020 18:13   CT Abdomen Pelvis W Contrast  Result Date: 10/29/2020 CLINICAL DATA:  Cough, abdominal distension, jaundice. EXAM: CT ANGIOGRAPHY CHEST CT ABDOMEN AND PELVIS WITH CONTRAST TECHNIQUE: Multidetector CT imaging of the chest was performed using the standard protocol during bolus administration of intravenous contrast. Multiplanar CT image reconstructions and MIPs were obtained to evaluate the vascular anatomy. Multidetector CT imaging of the  abdomen and pelvis was performed using the standard protocol during bolus administration of intravenous contrast. CONTRAST:  OMNIPAQUE IOHEXOL 350 MG/ML SOLN COMPARISON:  None. FINDINGS: CTA CHEST FINDINGS Cardiovascular: Satisfactory opacification of the pulmonary arteries to the segmental level. No evidence of pulmonary embolism. Normal heart size. No pericardial effusion. Mediastinum/Nodes: No enlarged mediastinal, hilar, or axillary lymph nodes. Thyroid gland, trachea, and esophagus demonstrate no significant findings. Lungs/Pleura: There is are large left pleural effusion with associated atelectasis. The right lung is clear with no pleural effusion. There is no pneumothorax. Musculoskeletal: No chest wall abnormality. No acute or significant osseous findings. Review of the MIP images confirms the above findings. CT ABDOMEN and PELVIS FINDINGS Hepatobiliary: The liver has a nodular surface contour. No focal liver abnormality is seen. No gallstones, gallbladder wall thickening, or biliary dilatation. Pancreas: Unremarkable. No pancreatic ductal dilatation or surrounding inflammatory changes. Spleen: Borderline enlarged size without focal abnormality. Adrenals/Urinary Tract: Adrenal glands are unremarkable. Kidneys are normal, without renal calculi, focal lesion, or hydronephrosis. Bladder is empty. Stomach/Bowel: Stomach is within normal limits. No pericecal inflammatory changes are noted to suggest acute appendicitis. No evidence of  bowel wall thickening, distention, or inflammatory changes. Vascular/Lymphatic: Portosystemic shunts are noted, including perisplenic and periumbilical collaterals. No enlarged abdominal or pelvic lymph nodes. Reproductive: Uterus and bilateral adnexa are unremarkable. Other: There is moderate volume ascites. No abdominal wall hernia is identified. Musculoskeletal: No acute or significant osseous findings. Review of the MIP images confirms the above findings. IMPRESSION: 1. No  evidence of pulmonary embolism. 2. Large left pleural effusion with associated atelectasis. 3. Cirrhosis and sequela of portal hypertension including moderate volume ascites. Electronically Signed   By: Romona Curlsyler  Litton M.D.   On: 10/29/2020 18:13   Scheduled Meds: . furosemide  20 mg Oral QHS  . potassium chloride  10 mEq Oral QHS  . spironolactone  100 mg Oral QHS   Continuous Infusions:   LOS: 0 days   Merlene Laughtermair Latif Sheikh, DO [facility] PAGER is on AMION  If 7PM-7AM, please contact night-coverage [email or web address]

## 2020-10-30 NOTE — Progress Notes (Incomplete)
Initial Nutrition Assessment  DOCUMENTATION CODES:      INTERVENTION:  *** strawberry NUTRITION DIAGNOSIS:     related to   as evidenced by  .  ***  GOAL:      ***  MONITOR:      REASON FOR ASSESSMENT:        ASSESSMENT:      Was diubg 1/2 snmoothi now yyou 3/4  Weight history: put on weight during covid 225 then lost a lo0t really quickly dt sick 190-195, now at 150-160 since feb Wt loss occurred nausea vomiting since may-dec 2021 last year at first slowly then rapid beginnning jan more difficult keeping things down Difficult getting in with doctos  faile prep for colonoscopy, will try again Blood in stool outpt colonoscopy   gatorade for electrolye discussion  NUTRITION - FOCUSED PHYSICAL EXAM:  {RD Focused Exam List:21252}  Diet Order:   Diet Order            Diet NPO time specified  Diet effective midnight                 EDUCATION NEEDS:      Skin:     Last BM:     Height:   Ht Readings from Last 1 Encounters:  10/29/20 5\' 8"  (1.727 m)    Weight:   Wt Readings from Last 1 Encounters:  10/29/20 69.4 kg    Ideal Body Weight:     BMI:  Body mass index is 23.26 kg/m.  Estimated Nutritional Needs:   Kcal:     Protein:     Fluid:       ***

## 2020-10-30 NOTE — Discharge Summary (Signed)
Physician Discharge Summary  Anaelle Dunton KVQ:[id number] DOB: [birth year] DOA: 10/29/2020  PCP: Lilian Coma., MD  Admit date: 10/29/2020 Discharge date: 10/30/2020  Admitted From: Home Disposition: Left AMA   Recommendations for Outpatient Follow-up:  1. Follow up with PCP in 1-2 weeks; Have PCP do Ambulatory Home O2 Screen  2. Follow up with Gastroenterology within 1-2 weeks 3. Please obtain CMP/CBC, Mag, Phos in one week 4. Repeat CXR within 1-2 weeks 5. Please follow up on the following pending results: Fluid Studies from Thoracentesis   Home Health: No Equipment/Devices: None    Discharge Condition: Guarded CODE STATUS: FULL CODE Diet recommendation: 2 Gram Sodium Diet  Brief/Interim Summary: HPI per Dr. Tennis Must on 10/29/20 Tangela Dolliver is a 45 y.o. female with medical history significant of liver cirrhosis diagnosed 2 months ago due to alcohol abuse, hyponatremia, macrocytosis, portal hypertension who is coming to the emergency department referred from the urgent care center where she went due to pleuritic, no radiated chest pain associated with nonproductive cough and dyspnea on exertion since Wednesday.  The patient denies fever or chills, but states she had night sweats last night (not sure if she was just warm).  Her appetite has not changed and is actually improved from 2 months ago.  She denies palpitations, dizziness, PND, orthopnea or lower extremities pitting edema.  She has chronic RUQ pain, occasional nausea, but denies emesis, diarrhea, constipation, melena or hematochezia.  Her urine is amber looking, but no gross hematuria, dysuria, frequency or oliguria.  Denies polyuria, polydipsia, polyphagia or blurred vision.  Since she was diagnosed with cirrhosis, the patient states that she has had 2 therapeutic paracentesis.  ED Course: Initial vital signs were temperature 98.2 F, pulse 120, respiration 18, BP 115/80 mmHg and O2 sat 99% on room  air.  Lab work: CBC showed a white count of 14.6, hemoglobin 12.8 g/dL with a MCV of 107.9 fL and platelets of 150.  PT 1.5 and INR 18.Troponin x2 normal.  Sodium 129, potassium 3.3, chloride 92 and CO2 25 mmol/L.  Glucose 126 mg/dL.  Normal BUN and creatinine total protein is 8.1 and albumin 3.2 g/dL.  AST was 60 units/L.  Normal ALT and alk phos.  Total bilirubin increased from 8.8 to 8.9 mg/dL to 10.9 mg/dL.  Lipase was 32 units/L.  Calcium was 9.4, magnesium was 1.1 and phosphorus 2.2 mg/dL.  Imaging: CTA chest showed no PE, but there was a large left pleural effusion with associated atelectasis.  CT abdomen/pelvis with contrast showed cirrhosis with sequela of portal hypertension including moderate volume ascites.  Please see images and full radiology report for further detail.  **Interim History Interventional radiology was consulted for further evaluation and management and she underwent a ultrasound-guided diagnostic and therapeutic left-sided centesis that yielded 760 cc of bloody fluid.  She had no immediate complications and a follow-up chest x-ray still show that she still had a moderate left-sided pleural effusion.  She did experience a brief episode of nausea with lightheadedness during the procedure transient drop in blood pressure which was a vasovagal response.  The procedure was stopped given her vasovagal response and she is given oral fluids and positioned supine on stretcher and a cool compress to her head with resolution of her symptoms.  Post procedure blood pressure is 123/97.  Ultrasound of the abdomen which showed small amount of ascites present still no paracentesis was going to be for performed.  Subsequently after her thoracentesis patient became agitated and demanded  to be discharged.  I did not feel that she is medically stable to be discharged at this time given her recent events and she did not have an ambulatory home O2 screen done but subsequently she is being of sound  mind signed out [place].  She is advised to follow-up with her PCP and call 911 if any emergencies arise.  Discharge Diagnoses:  Principal Problem:   Atypical chest pain Active Problems:   Cirrhosis of liver with ascites (HCC)   Hyponatremia   Leukocytosis   Portal hypertension (HCC)   Pleural effusion on left   Macrocytosis   Hypokalemia   Hypophosphatemia   Hypomagnesemia   SOB (shortness of breath)  Atypical chest pain  Pleural effusion on left -Placed on the observation/telemetry. -Analgesics as needed and had Fentanyl 25 mcg IVP every 2 hours PRN) -Antiemetics with Zofran 4 mg IVP every 6 hours PRN. -Incentive spirometry as tolerated. -Consult IR in a.m. for imaging guided thoracentesis; Interventional radiology was consulted for further evaluation and management and she underwent a ultrasound-guided diagnostic and therapeutic left-sided centesis that yielded 760 cc of bloody fluid.  She had no immediate complications and a follow-up chest x-ray still show that she still had a moderate left-sided pleural effusion.  She did experience a brief episode of nausea with lightheadedness during the procedure transient drop in blood pressure which was a vasovagal response.  The procedure was stopped given her vasovagal response and she is given oral fluids and positioned supine on stretcher and a cool compress to her head with resolution of her symptoms.  Post procedure blood pressure is 123/97.  Ultrasound of the abdomen which showed small amount of ascites present still no paracentesis was going to be for performed. -Subsequently she became agitated and did not want further treatment or her labs repeated and she signed out AGAINST MEDICAL ADVICE    Cirrhosis of liver with ascites ([place]) Continue furosemide 20 mg p.o. nightly and spironolactone 50 mg p.o. daily. Continue regular KCl supplementation. Continue spironolactone 100 mg p.o. nightly. Added hydroxyzine 25 mg PO q6  hr PRN for pruritus. -Unfortunately ultrasound of the abdomen showed only a small amount of abdominal pelvic ascites so paracentesis was not done to be pursued but we are considering giving her a dose of IV Lasix but she signed out [place] -Patient's AST was elevated at 60 and trended down to 50 -Wanted to check PT and INR in the morning again but she signed out [place]    Portal hypertension ([place]) As above. Follow-up with hepatology and was been to consider inpatient gastroenterology consultation but she signed out against medical vice prior to this happening    Hyponatremia Due to liver cirrhosis with ascites/chronic diuretic use. -Mildly improved as patient's sodium went from 120-132 now    Hypokalemia  KCl 40 mEq p.o. x1 dose. Continue KCl 10 mEq p.o. nightly tomorrow. Supplement magnesium and potassium. -Patient's potassium went from 3.3 is now 4.1 Negative monitor and trend in outpatient setting given that she signed out against medical vice    Hypomagnesemia Magnesium sulfate 2 g IVPB x1 dose. -Patient's magnesium level went from 1.1-2.5 -Follow-up in the outpatient setting given that she signed out AGAINST MEDICAL ADVICE    Hypophosphatemia K-Phos 500 mg p.o. x1 dose. -Phosphorus level improved from 2.2 and now 5.3 -Continue monitor and trend in the outpatient setting given that she signed out AGAINST MEDICAL ADVICE    Leukocytosis -Around baseline. -No fever, chills  or other active infection signs currently but was going to check her thoracentesis for culture -WBC trended down from 14.6 and now 12.8 next-continue monitor for signs and symptoms infection but she signed out AGAINST MEDICAL ADVICE    Macrocytosis and now macrocytic anemia -Had normal H96 and folic acid level a month ago. -Patient's MCV today was 107.1 -Patient's hemoglobin/hematocrit went from 12.8/37.0 and is now 11.0/31.8 -Was going to repeat an anemia panel and  continue to monitor for signs and symptoms of bleeding given her recent procedure however she signed out AGAINST MEDICAL ADVICE    Thrombocytopenia -Patient's platelet count went from 150 is now 120 Continue to monitor for signs and symptoms bleeding; currently no overt bleeding noted next-was wanting to repeat CBC in the morning but patient signed out [place]  Discharge Instructions  Allergies as of 10/30/2020      Reactions   Clemastine Rash      Hydrocodone Nausea And Vomiting   Other Other (See Comments)   Albertsons dayhist - d  - unknown   Oxycodone Nausea Only   Nausea to opioids      Medication List    TAKE these medications   ergocalciferol 1.25 MG (50000 UT) capsule Commonly known as: VITAMIN D2 Take 50,000 Units by mouth once a week.   furosemide 20 MG tablet Commonly known as: LASIX Take 20 mg by mouth daily.   potassium chloride 10 MEQ tablet Commonly known as: KLOR-CON Take 10 mEq by mouth daily.   spironolactone 50 MG tablet Commonly known as: ALDACTONE Take 100 mg by mouth daily.       Allergies  Allergen Reactions  . Clemastine Rash        . Hydrocodone Nausea And Vomiting  . Other Other (See Comments)    Albertsons dayhist - d  - unknown  . Oxycodone Nausea Only    Nausea to opioids     Consultations:  IR   Procedures/Studies: DG Chest 1 View  Result Date: 10/30/2020 CLINICAL DATA:  Post left thoracentesis EXAM: CHEST  1 VIEW COMPARISON:  None. FINDINGS: Moderate left pleural effusion with left lower lobe atelectasis. No pneumothorax Right lung clear.  Pulmonary vascularity normal. IMPRESSION: No complication post left thoracentesis. Moderate residual left pleural effusion. Electronically Signed   By: Franchot Gallo M.D.   On: 10/30/2020 15:08   CT Angio Chest PE W/Cm &/Or Wo Cm  Result Date: 10/29/2020 CLINICAL DATA:  Cough, abdominal distension, jaundice. EXAM: CT ANGIOGRAPHY CHEST CT ABDOMEN AND PELVIS WITH CONTRAST  TECHNIQUE: Multidetector CT imaging of the chest was performed using the standard protocol during bolus administration of intravenous contrast. Multiplanar CT image reconstructions and MIPs were obtained to evaluate the vascular anatomy. Multidetector CT imaging of the abdomen and pelvis was performed using the standard protocol during bolus administration of intravenous contrast. CONTRAST:  141m OMNIPAQUE IOHEXOL 350 MG/ML SOLN COMPARISON:  None. FINDINGS: CTA CHEST FINDINGS Cardiovascular: Satisfactory opacification of the pulmonary arteries to the segmental level. No evidence of pulmonary embolism. Normal heart size. No pericardial effusion. Mediastinum/Nodes: No enlarged mediastinal, hilar, or axillary lymph nodes. Thyroid gland, trachea, and esophagus demonstrate no significant findings. Lungs/Pleura: There is are large left pleural effusion with associated atelectasis. The right lung is clear with no pleural effusion. There is no pneumothorax. Musculoskeletal: No chest wall abnormality. No acute or significant osseous findings. Review of the MIP images confirms the above findings. CT ABDOMEN and PELVIS FINDINGS Hepatobiliary: The liver has a nodular surface contour. No  focal liver abnormality is seen. No gallstones, gallbladder wall thickening, or biliary dilatation. Pancreas: Unremarkable. No pancreatic ductal dilatation or surrounding inflammatory changes. Spleen: Borderline enlarged size without focal abnormality. Adrenals/Urinary Tract: Adrenal glands are unremarkable. Kidneys are normal, without renal calculi, focal lesion, or hydronephrosis. Bladder is empty. Stomach/Bowel: Stomach is within normal limits. No pericecal inflammatory changes are noted to suggest acute appendicitis. No evidence of bowel wall thickening, distention, or inflammatory changes. Vascular/Lymphatic: Portosystemic shunts are noted, including perisplenic and periumbilical collaterals. No enlarged abdominal or pelvic lymph nodes.  Reproductive: Uterus and bilateral adnexa are unremarkable. Other: There is moderate volume ascites. No abdominal wall hernia is identified. Musculoskeletal: No acute or significant osseous findings. Review of the MIP images confirms the above findings. IMPRESSION: 1. No evidence of pulmonary embolism. 2. Large left pleural effusion with associated atelectasis. 3. Cirrhosis and sequela of portal hypertension including moderate volume ascites. Electronically Signed   By: Zerita Boers M.D.   On: 10/29/2020 18:13   CT Abdomen Pelvis W Contrast  Result Date: 10/29/2020 CLINICAL DATA:  Cough, abdominal distension, jaundice. EXAM: CT ANGIOGRAPHY CHEST CT ABDOMEN AND PELVIS WITH CONTRAST TECHNIQUE: Multidetector CT imaging of the chest was performed using the standard protocol during bolus administration of intravenous contrast. Multiplanar CT image reconstructions and MIPs were obtained to evaluate the vascular anatomy. Multidetector CT imaging of the abdomen and pelvis was performed using the standard protocol during bolus administration of intravenous contrast. CONTRAST:  157m OMNIPAQUE IOHEXOL 350 MG/ML SOLN COMPARISON:  None. FINDINGS: CTA CHEST FINDINGS Cardiovascular: Satisfactory opacification of the pulmonary arteries to the segmental level. No evidence of pulmonary embolism. Normal heart size. No pericardial effusion. Mediastinum/Nodes: No enlarged mediastinal, hilar, or axillary lymph nodes. Thyroid gland, trachea, and esophagus demonstrate no significant findings. Lungs/Pleura: There is are large left pleural effusion with associated atelectasis. The right lung is clear with no pleural effusion. There is no pneumothorax. Musculoskeletal: No chest wall abnormality. No acute or significant osseous findings. Review of the MIP images confirms the above findings. CT ABDOMEN and PELVIS FINDINGS Hepatobiliary: The liver has a nodular surface contour. No focal liver abnormality is seen. No gallstones, gallbladder  wall thickening, or biliary dilatation. Pancreas: Unremarkable. No pancreatic ductal dilatation or surrounding inflammatory changes. Spleen: Borderline enlarged size without focal abnormality. Adrenals/Urinary Tract: Adrenal glands are unremarkable. Kidneys are normal, without renal calculi, focal lesion, or hydronephrosis. Bladder is empty. Stomach/Bowel: Stomach is within normal limits. No pericecal inflammatory changes are noted to suggest acute appendicitis. No evidence of bowel wall thickening, distention, or inflammatory changes. Vascular/Lymphatic: Portosystemic shunts are noted, including perisplenic and periumbilical collaterals. No enlarged abdominal or pelvic lymph nodes. Reproductive: Uterus and bilateral adnexa are unremarkable. Other: There is moderate volume ascites. No abdominal wall hernia is identified. Musculoskeletal: No acute or significant osseous findings. Review of the MIP images confirms the above findings. IMPRESSION: 1. No evidence of pulmonary embolism. 2. Large left pleural effusion with associated atelectasis. 3. Cirrhosis and sequela of portal hypertension including moderate volume ascites. Electronically Signed   By: TZerita BoersM.D.   On: 10/29/2020 18:13   [place]AS[place]ES (ABDOMEN LIMITED)  Result Date: 10/30/2020 CLINICAL DATA:  Abdominal distension, ascites, assess for paracentesis EXAM: LIMITED ABDOMEN ULTRASOUND FOR ASCITES TECHNIQUE: Limited ultrasound survey for ascites was performed in all four abdominal quadrants. COMPARISON:  08/31/2020 FINDINGS: Survey of the abdominal 4 quadrants demonstrates a trace amount of abdominopelvic ascites. There is not enough to warrant therapeutic paracentesis. Procedure not performed. IMPRESSION: Trace abdominopelvic ascites by  ultrasound Electronically Signed   By: Jerilynn Mages.  Shick M.D.   On: 10/30/2020 15:15   US THORACENTESIS ASP PLEURAL SPACE W/IMG GUIDE  Result Date: 10/30/2020 INDICATION: Patient with history of cirrhosis, portal  hypertension, pleuritic chest pain, cough, dyspnea, left pleural effusion. Request received for diagnostic and therapeutic left thoracentesis. EXAM: ULTRASOUND GUIDED DIAGNOSTIC AND THERAPEUTIC LEFT THORACENTESIS MEDICATIONS: 1% lidocaine to skin and subcutaneous tissue COMPLICATIONS: None immediate. PROCEDURE: An ultrasound guided thoracentesis was thoroughly discussed with the patient and questions answered. The benefits, risks, alternatives and complications were also discussed. The patient understands and wishes to proceed with the procedure. Written consent was obtained. Ultrasound was performed to localize and mark an adequate pocket of fluid in the left chest. The area was then prepped and draped in the normal sterile fashion. 1% Lidocaine was used for local anesthesia. Under ultrasound guidance a 6 Fr Safe-T-Centesis catheter was introduced. Thoracentesis was performed. The catheter was removed and a dressing applied. FINDINGS: A total of approximately 670 cc of bloody fluid was removed. Samples were sent to the laboratory as requested by the clinical team. Patient did experience brief episode of nausea and lightheadedness during procedure with transient drop in blood pressure/vasovagal response. The procedure was stopped and patient given oral fluids, positioned supine on stretcher and given cool compress to forehead with resolution of symptoms. Blood pressure postprocedure was stable. IMPRESSION: Successful ultrasound guided diagnostic and therapeutic left thoracentesis yielding 670 cc of pleural fluid. Ordering MD made aware of above findings. Read by: Rowe Robert, PA-C Electronically Signed   By: Jerilynn Mages.  Shick M.D.   On: 10/30/2020 15:04    Subjective: Seen and examined at bedside earlier this morning and underwent a thoracentesis later in the afternoon.  Subsequently after her thoracentesis she became very agitated and wanted to be discharged.  I do not feel that she is medically stable to be discharged  at this time and subsequently she signed out [place] being of sound mind.  Discharge Exam: Vitals:   10/30/20 1442 10/30/20 1706  BP: (!) 123/97 116/80  Pulse:  (!) 109  Resp:    Temp:  97.8 F (36.6 C)  SpO2:  99%   Vitals:   10/30/20 0544 10/30/20 1239 10/30/20 1442 10/30/20 1706  BP: 104/69 102/68 (!) 123/97 116/80  Pulse: 96 93  (!) 109  Resp: 18 17    Temp: 98.6 F (37 C) 98.6 F (37 C)  97.8 F (36.6 C)  TempSrc:  Oral  Oral  SpO2: 96% 100%  99%  Weight:      Height:       General: Pt is alert, awake, not in acute distress Cardiovascular: RRR, S1/S2 +, no rubs, no gallops Respiratory: Diminished bilaterally worse on the left compared to the right, no wheezing, no rhonchi; unlabored breathing Abdominal: Soft, NT, slightly distended secondary body habitus, bowel sounds + Extremities: Trace edema, no cyanosis  The results of significant diagnostics from this hospitalization (including imaging, microbiology, ancillary and laboratory) are listed below for reference.    Microbiology: Recent Results (from the past 240 hour(s))  SARS CORONAVIRUS 2 (TAT 6-24 HRS) Nasopharyngeal Nasopharyngeal Swab     Status: None   Collection Time: 10/29/20  7:22 PM   Specimen: Nasopharyngeal Swab  Result Value Ref Range Status   SARS Coronavirus 2 NEGATIVE NEGATIVE Final    Comment: (NOTE) SARS-CoV-2 target nucleic acids are NOT DETECTED.  The SARS-CoV-2 RNA is generally detectable in upper and lower respiratory specimens during the acute  phase of infection. Negative results do not preclude SARS-CoV-2 infection, do not rule out co-infections with other pathogens, and should not be used as the sole basis for treatment or other patient management decisions. Negative results must be combined with clinical observations, patient history, and epidemiological information. The expected result is Negative.  Fact Sheet for  Patients: SugarRoll.be  Fact Sheet for Healthcare Providers: https://www.woods-mathews.com/  This test is not yet approved or cleared by the [place] FDA and  has been authorized for detection and/or diagnosis of SARS-CoV-2 by FDA under an Emergency Use Authorization (EUA). This EUA will remain  in effect (meaning this test can be used) for the duration of the COVID-19 declaration under Se ction 564(b)(1) of the Act, 21 U.S.C. section 360bbb-3(b)(1), unless the authorization is terminated or revoked sooner.  Performed at [facility], [place] [address], [place], [place] [address]     Labs: BNP (last 3 results) No results for input(s): BNP in the last 8760 hours. Basic Metabolic Panel: Recent Labs  Lab 10/29/20 1642 10/29/20 1848 10/30/20 0508  NA 129*  --  132*  K 3.3*  --  4.1  CL 92*  --  97*  CO2 25  --  26  GLUCOSE 126*  --  105*  BUN 8  --  7  CREATININE 0.68  --  0.76  CALCIUM 9.4  --  9.0  MG  --  1.1* 2.5*  PHOS  --  2.2* 5.3*   Liver Function Tests: Recent Labs  Lab 10/29/20 1642 10/30/20 0508  AST 60* 50*  ALT 20 18  ALKPHOS 104 95  BILITOT 10.9* 8.8*  PROT 8.1 6.6  ALBUMIN 3.2* 2.7*   Recent Labs  Lab 10/29/20 1642  LIPASE 32   No results for input(s): AMMONIA in the last 168 hours. CBC: Recent Labs  Lab 10/29/20 1620 10/30/20 0508  WBC 14.6* 12.8*  HGB 12.8 11.0*  HCT 37.0 31.8*  MCV 107.9* 107.1*  PLT 150 128*   Cardiac Enzymes: No results for input(s): CKTOTAL, CKMB, CKMBINDEX, TROPONINI in the last 168 hours. BNP: Invalid input(s): POCBNP CBG: No results for input(s): GLUCAP in the last 168 hours. D-Dimer No results for input(s): DDIMER in the last 72 hours. Hgb A1c No results for input(s): HGBA1C in the last 72 hours. Lipid Profile No results for input(s): CHOL, HDL, LDLCALC, TRIG, CHOLHDL, LDLDIRECT in the last 72 hours. Thyroid function studies No results for input(s):  TSH, T4TOTAL, T3FREE, THYROIDAB in the last 72 hours.  Invalid input(s): FREET3 Anemia work up No results for input(s): VITAMINB12, FOLATE, FERRITIN, TIBC, IRON, RETICCTPCT in the last 72 hours. Urinalysis No results found for: COLORURINE, APPEARANCEUR, [place], [place], [place], [place], [place], [place], PROTEINUR, UROBILINOGEN, NITRITE, LEUKOCYTESUR Sepsis Labs Invalid input(s): PROCALCITONIN,  WBC,  LACTICIDVEN Microbiology Recent Results (from the past 240 hour(s))  SARS CORONAVIRUS 2 (TAT 6-24 HRS) Nasopharyngeal Nasopharyngeal Swab     Status: None   Collection Time: 10/29/20  7:22 PM   Specimen: Nasopharyngeal Swab  Result Value Ref Range Status   SARS Coronavirus 2 NEGATIVE NEGATIVE Final    Comment: (NOTE) SARS-CoV-2 target nucleic acids are NOT DETECTED.  The SARS-CoV-2 RNA is generally detectable in upper and lower respiratory specimens during the acute phase of infection. Negative results do not preclude SARS-CoV-2 infection, do not rule out co-infections with other pathogens, and should not be used as the sole basis for treatment or other patient management decisions. Negative results must be combined with clinical  observations, patient history, and epidemiological information. The expected result is Negative.  Fact Sheet for Patients: SugarRoll.be  Fact Sheet for Healthcare Providers: https://www.woods-mathews.com/  This test is not yet approved or cleared by the [place] FDA and  has been authorized for detection and/or diagnosis of SARS-CoV-2 by FDA under an Emergency Use Authorization (EUA). This EUA will remain  in effect (meaning this test can be used) for the duration of the COVID-19 declaration under Se ction 564(b)(1) of the Act, 21 U.S.C. section 360bbb-3(b)(1), unless the authorization is terminated or revoked sooner.  Performed at [facility], [place] [address], [place], [place] [address]     Time coordinating discharge: 35 minutes  SIGNED:  Kerney Elbe, DO [facility] 10/30/2020, 6:12 PM Pager is on [place]  If 7PM-7AM, please contact night-coverage [email or web address]

## 2020-10-30 NOTE — Procedures (Signed)
Ultrasound-guided diagnostic and therapeutic left thoracentesis performed yielding 670 cc of bloody fluid. No immediate complications. Follow-up chest x-ray pending. Pt did experience brief episode of nausea with lightheadedness during procedure with transient drop in BP (vasovagal response). Procedure was stopped, pt given oral fluids,  positioned supine on stretcher and given cool compress to forehead with resolution of symptoms. BP postprocedure 123/97. Pt did not eat or drink for several hours before thoracentesis. NPO status not required for thoracentesis/paracentesis. On limited US abd today only small amount of ascites present.

## 2020-10-31 LAB — CYTOLOGY - NON PAP

## 2020-10-31 LAB — TRIGLYCERIDES, BODY FLUIDS: Triglycerides, Fluid: 57 mg/dL

## 2020-11-02 LAB — ACID FAST SMEAR (AFB, MYCOBACTERIA): Acid Fast Smear: NEGATIVE

## 2020-11-03 LAB — BODY FLUID CULTURE W GRAM STAIN: Culture: NO GROWTH

## 2020-12-19 DIAGNOSIS — I8510 Secondary esophageal varices without bleeding: Secondary | ICD-10-CM | POA: Insufficient documentation

## 2020-12-19 DIAGNOSIS — Z86010 Personal history of colonic polyps: Secondary | ICD-10-CM | POA: Insufficient documentation

## 2020-12-29 LAB — ACID FAST CULTURE WITH REFLEXED SENSITIVITIES (MYCOBACTERIA): Acid Fast Culture: NEGATIVE

## 2021-01-06 ENCOUNTER — Other Ambulatory Visit: Payer: Self-pay

## 2021-01-06 ENCOUNTER — Encounter (HOSPITAL_BASED_OUTPATIENT_CLINIC_OR_DEPARTMENT_OTHER): Payer: Self-pay | Admitting: *Deleted

## 2021-01-06 ENCOUNTER — Emergency Department (HOSPITAL_BASED_OUTPATIENT_CLINIC_OR_DEPARTMENT_OTHER): Payer: Managed Care, Other (non HMO) | Admitting: Radiology

## 2021-01-06 ENCOUNTER — Emergency Department (HOSPITAL_BASED_OUTPATIENT_CLINIC_OR_DEPARTMENT_OTHER)
Admission: EM | Admit: 2021-01-06 | Discharge: 2021-01-06 | Disposition: A | Discharge status: Home / Self Care | Payer: Managed Care, Other (non HMO) | Attending: Emergency Medicine | Admitting: Emergency Medicine

## 2021-01-06 DIAGNOSIS — R519 Headache, unspecified: Secondary | ICD-10-CM | POA: Diagnosis not present

## 2021-01-06 DIAGNOSIS — Z79899 Other long term (current) drug therapy: Secondary | ICD-10-CM | POA: Diagnosis not present

## 2021-01-06 DIAGNOSIS — M25511 Pain in right shoulder: Secondary | ICD-10-CM | POA: Diagnosis not present

## 2021-01-06 DIAGNOSIS — I10 Essential (primary) hypertension: Secondary | ICD-10-CM | POA: Insufficient documentation

## 2021-01-06 DIAGNOSIS — Y92410 Unspecified street and highway as the place of occurrence of the external cause: Secondary | ICD-10-CM | POA: Insufficient documentation

## 2021-01-06 DIAGNOSIS — Z87891 Personal history of nicotine dependence: Secondary | ICD-10-CM | POA: Diagnosis not present

## 2021-01-06 DIAGNOSIS — R0789 Other chest pain: Secondary | ICD-10-CM

## 2021-01-06 NOTE — ED Notes (Signed)
Ice pack applied.
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
## 2021-04-02 DIAGNOSIS — R188 Other ascites: Secondary | ICD-10-CM | POA: Insufficient documentation

## 2021-04-03 DIAGNOSIS — Z01818 Encounter for other preprocedural examination: Secondary | ICD-10-CM | POA: Insufficient documentation

## 2021-06-09 ENCOUNTER — Encounter (HOSPITAL_BASED_OUTPATIENT_CLINIC_OR_DEPARTMENT_OTHER): Payer: Self-pay | Admitting: *Deleted

## 2021-06-09 ENCOUNTER — Emergency Department (HOSPITAL_BASED_OUTPATIENT_CLINIC_OR_DEPARTMENT_OTHER): Payer: Managed Care, Other (non HMO)

## 2021-06-09 ENCOUNTER — Observation Stay (HOSPITAL_BASED_OUTPATIENT_CLINIC_OR_DEPARTMENT_OTHER)
Admission: EM | Admit: 2021-06-09 | Discharge: 2021-06-11 | Disposition: A | Discharge status: Home / Self Care | Payer: Managed Care, Other (non HMO) | Attending: Internal Medicine | Admitting: Internal Medicine

## 2021-06-09 ENCOUNTER — Other Ambulatory Visit: Payer: Self-pay

## 2021-06-09 DIAGNOSIS — J948 Other specified pleural conditions: Secondary | ICD-10-CM

## 2021-06-09 DIAGNOSIS — Z7682 Awaiting organ transplant status: Secondary | ICD-10-CM | POA: Diagnosis not present

## 2021-06-09 DIAGNOSIS — R109 Unspecified abdominal pain: Secondary | ICD-10-CM | POA: Diagnosis present

## 2021-06-09 DIAGNOSIS — Z20822 Contact with and (suspected) exposure to covid-19: Secondary | ICD-10-CM | POA: Diagnosis not present

## 2021-06-09 DIAGNOSIS — R188 Other ascites: Secondary | ICD-10-CM

## 2021-06-09 DIAGNOSIS — K766 Portal hypertension: Secondary | ICD-10-CM | POA: Diagnosis not present

## 2021-06-09 DIAGNOSIS — R0602 Shortness of breath: Secondary | ICD-10-CM | POA: Diagnosis not present

## 2021-06-09 DIAGNOSIS — K7031 Alcoholic cirrhosis of liver with ascites: Secondary | ICD-10-CM | POA: Diagnosis not present

## 2021-06-09 DIAGNOSIS — E871 Hypo-osmolality and hyponatremia: Secondary | ICD-10-CM | POA: Insufficient documentation

## 2021-06-09 DIAGNOSIS — J90 Pleural effusion, not elsewhere classified: Principal | ICD-10-CM | POA: Insufficient documentation

## 2021-06-09 DIAGNOSIS — K8020 Calculus of gallbladder without cholecystitis without obstruction: Secondary | ICD-10-CM | POA: Insufficient documentation

## 2021-06-09 DIAGNOSIS — R17 Unspecified jaundice: Secondary | ICD-10-CM

## 2021-06-09 DIAGNOSIS — K7210 Chronic hepatic failure without coma: Secondary | ICD-10-CM | POA: Insufficient documentation

## 2021-06-09 DIAGNOSIS — Z9889 Other specified postprocedural states: Secondary | ICD-10-CM

## 2021-06-09 DIAGNOSIS — K7460 Unspecified cirrhosis of liver: Secondary | ICD-10-CM | POA: Diagnosis present

## 2021-06-09 DIAGNOSIS — R319 Hematuria, unspecified: Secondary | ICD-10-CM

## 2021-06-09 DIAGNOSIS — N200 Calculus of kidney: Secondary | ICD-10-CM | POA: Insufficient documentation

## 2021-06-09 LAB — BASIC METABOLIC PANEL
Anion gap: 11 (ref 5–15)
BUN: 15 mg/dL (ref 6–20)
CO2: 23 mmol/L (ref 22–32)
Calcium: 10.1 mg/dL (ref 8.9–10.3)
Chloride: 99 mmol/L (ref 98–111)
Creatinine, Ser: 0.79 mg/dL (ref 0.44–1.00)
GFR, Estimated: >60 mL/min (ref >60)
Glucose, Bld: 103 mg/dL — ABNORMAL HIGH (ref 70–99)
Potassium: 4.2 mmol/L (ref 3.5–5.1)
Sodium: 133 mmol/L — ABNORMAL LOW (ref 135–145)

## 2021-06-09 LAB — HCG, SERUM, QUALITATIVE: Preg, Serum: NEGATIVE

## 2021-06-09 LAB — CBC
HCT: 36 % (ref 36.0–46.0)
Hemoglobin: 12.3 g/dL (ref 12.0–15.0)
MCH: 34.6 pg — ABNORMAL HIGH (ref 26.0–34.0)
MCHC: 34.2 g/dL (ref 30.0–36.0)
MCV: 101.1 fL — ABNORMAL HIGH (ref 80.0–100.0)
Platelets: 151 10*3/uL (ref 150–400)
RBC: 3.56 MIL/uL — ABNORMAL LOW (ref 3.87–5.11)
RDW: 12.5 % (ref 11.5–15.5)
WBC: 9.8 10*3/uL (ref 4.0–10.5)
nRBC: 0 % (ref 0.0–0.2)

## 2021-06-09 LAB — HEPATIC FUNCTION PANEL
ALT: 22 U/L (ref 0–44)
AST: 43 U/L — ABNORMAL HIGH (ref 15–41)
Albumin: 3.5 g/dL (ref 3.5–5.0)
Alkaline Phosphatase: 127 U/L — ABNORMAL HIGH (ref 38–126)
Bilirubin, Direct: 2.3 mg/dL — ABNORMAL HIGH (ref 0.0–0.2)
Indirect Bilirubin: 7.2 mg/dL — ABNORMAL HIGH (ref 0.3–0.9)
Total Bilirubin: 9.5 mg/dL — ABNORMAL HIGH (ref 0.3–1.2)
Total Protein: 6.8 g/dL (ref 6.5–8.1)

## 2021-06-09 LAB — URINALYSIS, ROUTINE W REFLEX MICROSCOPIC
Bilirubin Urine: NEGATIVE
Glucose, UA: NEGATIVE mg/dL
Ketones, ur: NEGATIVE mg/dL
Leukocytes,Ua: NEGATIVE
Nitrite: NEGATIVE
Protein, ur: NEGATIVE mg/dL
Specific Gravity, Urine: 1.01 (ref 1.005–1.030)
pH: 5 (ref 5.0–8.0)

## 2021-06-09 LAB — PROTIME-INR
INR: 1.4 — ABNORMAL HIGH (ref 0.8–1.2)
Prothrombin Time: 17.3 seconds — ABNORMAL HIGH (ref 11.4–15.2)

## 2021-06-09 LAB — RESP PANEL BY RT-PCR (FLU A&B, COVID) ARPGX2
Influenza A by PCR: NEGATIVE
Influenza B by PCR: NEGATIVE
SARS Coronavirus 2 by RT PCR: NEGATIVE

## 2021-06-09 LAB — MAGNESIUM: Magnesium: 1.8 mg/dL (ref 1.7–2.4)

## 2021-06-09 MED ORDER — FUROSEMIDE 10 MG/ML IJ SOLN
40.0000 mg | Freq: Every day | INTRAMUSCULAR | Status: DC
  Administered 2021-06-09: 40 mg via INTRAVENOUS
  Filled 2021-06-09 (×2): qty 4

## 2021-06-09 MED ORDER — SPIRONOLACTONE 25 MG PO TABS
100.0000 mg | ORAL_TABLET | Freq: Every day | ORAL | Status: DC
  Filled 2021-06-09: qty 4

## 2021-06-09 MED ORDER — ACETAMINOPHEN 650 MG RE SUPP: 650.0000 mg | Freq: Four times a day (QID) | RECTAL | Status: DC | PRN

## 2021-06-09 MED ORDER — VITAMIN D (ERGOCALCIFEROL) 1.25 MG (50000 UNIT) PO CAPS
50000.0000 [IU] | ORAL_CAPSULE | ORAL | Status: DC
  Administered 2021-06-09: 50000 [IU] via ORAL
  Filled 2021-06-09: qty 1

## 2021-06-09 MED ORDER — ACETAMINOPHEN 325 MG PO TABS: 650.0000 mg | ORAL_TABLET | Freq: Four times a day (QID) | ORAL | Status: DC | PRN

## 2021-06-09 MED ORDER — CEPHALEXIN 500 MG PO CAPS
500.0000 mg | ORAL_CAPSULE | Freq: Two times a day (BID) | ORAL | Status: AC
  Administered 2021-06-09 – 2021-06-10 (×2): 500 mg via ORAL
  Filled 2021-06-09 (×2): qty 1

## 2021-06-09 MED ORDER — ONDANSETRON HCL 4 MG PO TABS: 4.0000 mg | ORAL_TABLET | Freq: Four times a day (QID) | ORAL | Status: DC | PRN

## 2021-06-09 MED ORDER — ONDANSETRON HCL 4 MG/2ML IJ SOLN: 4.0000 mg | Freq: Four times a day (QID) | INTRAMUSCULAR | Status: DC | PRN

## 2021-06-09 NOTE — ED Notes (Signed)
Attempt to call report [facility], nurse unavailable. Will return call.

## 2021-06-09 NOTE — ED Notes (Signed)
Report called to Rebecca Bonilla unit [facility] east. Pt is pov to [place].

## 2021-06-09 NOTE — ED Provider Notes (Signed)
[facility] EMERGENCY DEPT Provider Note   CSN: [id number] Arrival date & time: 06/09/21  [id number]     History Chief Complaint  Patient presents with   Flank Pain    Rebecca Bonilla is a 45 y.o. female.  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  She was seen a week ago today with some left flank pain and noted to have hematuria.  The working diagnosis, as reported by the patient, was kidney stone.  She was placed on Keflex.  She states she is completing this.  She has continued to have intermittent flank pain through the week with the worst pain being 8 out of 10.  Pain is worsened by moving around and is improved by laying still.  She has not noted any frequency of urination or pain with urination.  She has not had fever, chills, nausea, vomiting, or diarrhea.  She feels that she is perimenopausal and has not had a menstrual cycle for several months.  She states that it is not possible that she is pregnant.  She is followed by GI specialist in [facility] for her liver and is on the [facility] transplant list.  She endorses that she has had some shortness of breath over the past few days that she associates with inhaling some hot peppers.  She has some mild cough with this.  She  no longer drinks alcohol since last January.     Past Medical History:  Diagnosis Date   Alcohol abuse    Cirrhosis of liver with ascites ([place]) 08/14/2020   Hyponatremia 08/15/2020   Macrocytosis 10/29/2020   Portal hypertension ([place]) 08/14/2020    Patient Active Problem List   Diagnosis Date Noted   SOB (shortness of breath) 10/30/2020   Atypical chest pain 10/29/2020   Pleural effusion on left 10/29/2020   Macrocytosis 10/29/2020   Hypokalemia 10/29/2020   Hypophosphatemia 10/29/2020   Hypomagnesemia 10/29/2020   Leukocytosis 10/12/2020   Hyponatremia 08/15/2020   Cirrhosis of liver with ascites ([place]) 08/14/2020   Portal  hypertension ([place]) 08/14/2020   Psoriasis 08/09/2020    Past Surgical History:  Procedure Laterality Date   ANKLE SURGERY     x 2     OB History     Gravida  1   Para      Term      Preterm      AB  1   Living         SAB      IAB      Ectopic      Multiple      Live Births              Family History  Problem Relation Age of Onset   Huntington's disease Mother     Social History   Tobacco Use   Smoking status: Former    Types: Cigarettes   Smokeless tobacco: Never  Vaping Use   Vaping Use: Former  Substance Use Topics   Alcohol use: Not Currently   Drug use: Never    Home Medications Prior to Admission medications   Medication Sig Start Date End Date Taking? Authorizing Provider  ergocalciferol (VITAMIN D2) 1.25 MG (50000 UT) capsule Take 50,000 Units by mouth once a week.    [provider]  furosemide (LASIX) 20 MG tablet Take 20 mg by mouth daily.    [provider]  potassium chloride (KLOR-CON) 10 MEQ tablet Take  10 mEq by mouth daily.    [provider]  spironolactone (ALDACTONE) 50 MG tablet Take 100 mg by mouth daily.    [provider]    Allergies    Clemastine, Hydrocodone, Other, and Oxycodone  Review of Systems   Review of Systems  Constitutional: Negative.   HENT: Negative.    Eyes: Negative.   Respiratory:  Positive for cough.   Cardiovascular: Negative.   Gastrointestinal: Negative.   Endocrine: Negative.   Genitourinary: Negative.   Musculoskeletal: Negative.   Allergic/Immunologic: Negative.   Neurological: Negative.   Hematological: Negative.   Psychiatric/Behavioral: Negative.    All other systems reviewed and are negative.  Physical Exam Updated Vital Signs BP 114/68 (BP Location: Right Arm)   Pulse (!) 102   Temp 98.7 F (37.1 C) (Oral)   Resp 16   Ht 1.702 m (5\' 7" )   Wt 76.2 kg   SpO2 98%   BMI 26.31 kg/m   Physical Exam Vitals and nursing note reviewed.   Constitutional:      General: She is not in acute distress.    Appearance: She is well-developed.  HENT:     Head: Normocephalic and atraumatic.     Right Ear: External ear normal.     Left Ear: External ear normal.     Nose: Nose normal.  Eyes:     Conjunctiva/sclera: Conjunctivae normal.     Pupils: Pupils are equal, round, and reactive to light.  Cardiovascular:     Rate and Rhythm: Normal rate and regular rhythm.  Pulmonary:     Effort: Pulmonary effort is normal.     Comments: Decreased breath sounds left base Abdominal:     General: Abdomen is flat.     Palpations: Abdomen is soft.  Musculoskeletal:        General: Normal range of motion.     Cervical back: Normal range of motion and neck supple.  Skin:    General: Skin is warm and dry.     Coloration: Skin is jaundiced.  Neurological:     Mental Status: She is alert and oriented to person, place, and time.     Motor: No abnormal muscle tone.     Coordination: Coordination normal.  Psychiatric:        Behavior: Behavior normal.        Thought Content: Thought content normal.    ED Results / Procedures / Treatments   Labs (all labs ordered are listed, but only abnormal results are displayed) Labs Reviewed  CBC - Abnormal; Notable for the following components:      Result Value   RBC 3.56 (*)    MCV 101.1 (*)    MCH 34.6 (*)    All other components within normal limits  HEPATIC FUNCTION PANEL - Abnormal; Notable for the following components:   AST 43 (*)    Alkaline Phosphatase 127 (*)    Total Bilirubin 9.5 (*)    Bilirubin, Direct 2.3 (*)    Indirect Bilirubin 7.2 (*)    All other components within normal limits  URINALYSIS, ROUTINE W REFLEX MICROSCOPIC - Abnormal; Notable for the following components:   Hgb urine dipstick MODERATE (*)    All other components within normal limits  HCG, SERUM, QUALITATIVE  PROTIME-INR  BASIC METABOLIC PANEL    EKG None  Radiology DG Chest Port 1 View  Result  Date: 06/09/2021 CLINICAL DATA:  Shortness of breath. EXAM: PORTABLE CHEST 1 VIEW COMPARISON:  01/06/2021 FINDINGS:  There is a massive left pleural effusion with extensive volume loss throughout the left lung. Only a small portion of the left lung is aerated in the left upper chest. Right lung is clear. There is some mediastinal shift towards the right due to large left pleural effusion. IMPRESSION: Very large left pleural effusion with extensive volume loss in the left lung. Electronically Signed   By: Markus Daft M.D.   On: 06/09/2021 12:26   CT Renal Stone Study  Result Date: 06/09/2021 CLINICAL DATA:  Hematuria, unknown cause.  Left flank pain. EXAM: CT ABDOMEN AND PELVIS WITHOUT CONTRAST TECHNIQUE: Multidetector CT imaging of the abdomen and pelvis was performed following the standard protocol without IV contrast. COMPARISON:  10/29/2020 FINDINGS: Lower chest: There is a very large left pleural effusion with collapse of the left lower lobe. There is also volume loss in the left upper lobe but the entire chest is not imaged. Negative for right pleural effusion. There is some mediastinal shift towards the right due to the large left pleural effusion. Hepatobiliary: Again noted is nodular contour and compatible with cirrhosis. Small amount of perihepatic ascites. Gallstones without gallbladder distension. Pancreas: Unremarkable. No pancreatic ductal dilatation or surrounding inflammatory changes. Spleen: Spleen is prominent for size and similar to the previous examination. Adrenals/Urinary Tract: Normal appearance of the adrenal glands. Negative for kidney stones or hydronephrosis. Fluid in the urinary bladder. Stomach/Bowel: Normal appearance of the stomach. No evidence for bowel distention or acute bowel inflammation. Vascular/Lymphatic: Again noted are diffusely enlarged venous structures throughout the abdomen particularly in the left upper quadrant around the left kidney and spleen. Patient also has a  prominent umbilical vein. These findings are compatible with portal hypertension. Reproductive: Uterus and bilateral adnexa are unremarkable. Other: Small amount of ascites in the pelvis and a small amount of ascites in the upper quadrants. Diffuse stranding throughout the abdominal mesentery similar to the previous examination and probably related to venous congestion from the portal hypertension. Musculoskeletal: No acute bone abnormality. IMPRESSION: 1. Large left pleural effusion with extensive volume loss in the left lung and mediastinal shift towards the right. Left pleural effusion etiology could be associated with a hepatic hydrothorax. 2. Cirrhosis with evidence of portal hypertension demonstrated by splenomegaly and enlarged venous structures throughout the abdomen and small amount of ascites. 3. Negative for kidney stones or hydronephrosis. 4. Cholelithiasis. Electronically Signed   By: Markus Daft M.D.   On: 06/09/2021 12:24    Procedures Procedures   Medications Ordered in ED Medications - No data to display  ED Course  I have reviewed the triage vital signs and the nursing notes.  Pertinent labs & imaging results that were available during my care of the patient were reviewed by me and considered in my medical decision making (see chart for details).  Clinical Course as of 06/09/21 1312  Sat Jun 09, 2021  1301 Large left-sided effusion noted.  Patient oxygenating well here.  Vital signs are stable. [DR]    Clinical Course User Index [DR] Pattricia Boss, MD   MDM Rules/Calculators/A&P                         45 year old female with known cirrhosis secondary to alcohol use who has been present from alcohol for almost a year now.  He presents today with some left flank pain and recent hematuria.  She was treated as an outpatient with antibiotics.  She was seen by her primary care doctor yesterday  and told to have a CT scan and some labs performed.  She presented here today for this.   She did endorse some dyspnea over the past 3 days.  Work-up here is negative for definitive urinary tract infection or stone or acute pathology of the renal system.  However, she has a large left-sided effusion that is almost obliterating the left side of her lungs.  This is likely causing some of her symptoms.  Given the fact that she has had increasing dyspnea over the past 3 days, feel the patient should have thoracentesis this weekend.  Her labs including CBC appear stable from before with platelets 150,000.  INR has been ordered. Bilirubin is stable at 9.5 from first prior. Gust with Dr. Robb Matar.  Plan transfer to [facility]. Final Clinical Impression(s) / ED Diagnoses Final diagnoses:  Recurrent left pleural effusion  Chronic liver failure without hepatic coma (HCC)  Elevated bilirubin    Rx / DC Orders ED Discharge Orders     None        Margarita Grizzle, MD 06/09/21 1313

## 2021-06-09 NOTE — H&P (Signed)
History and Physical    Rebecca Bonilla [id number] DOB: 05-17-1976 DOA: 06/09/2021  I have briefly reviewed the patient's prior medical records in [place]  PCP: Lilian Coma., MD  Patient coming from: home vis [facility]  Chief Complaint: flank pain/SOB  HPI: Rebecca Bonilla is a 45 y.o. 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  1 week ago she had left flank pain went to an urgent care and was diagnosed with kidney stone and started on abx.  She continued to have discomfort.  Also she developed worsening SOB that she attributed to irritant exposure while making hot sauce.  In the ER she had imaging that showed no renal stone but did show large pleural effusion on the left.  Of note, patient was hospitalized prior with pleural effusion in 4/22 and underwent a thoracentesis.  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  She has continued to have varying amounts of fluid since then: 7/2: large, 8/30: Small left pleural effusion , 9/27: Moderate-sized left pleural effusion with associated left basilar opacity consistent with atelectasis When she was diagnosed with the kidney stone she increased her PO fluid intake.   Review of Systems: As per HPI otherwise 10 point review of systems negative.   Past Medical History:  Diagnosis Date   Alcohol abuse    Cirrhosis of liver with ascites ([place]) 08/14/2020   Hyponatremia 08/15/2020   Macrocytosis 10/29/2020   Portal hypertension ([place]) 08/14/2020    Past Surgical History:  Procedure Laterality Date   ANKLE SURGERY     x 2     reports that she has quit smoking. Her smoking use included cigarettes. She has never used smokeless tobacco. She reports that she does not currently use alcohol. She reports that she does not use drugs.  Allergies   Allergen Reactions   Clemastine Rash         Hydrocodone Nausea And Vomiting   Other Other (See Comments)    [employer]ber[employer]ons dayhist - d  - unknown   Oxycodone Nausea Only    Nausea to opioids      Family History  Problem Relation Age of Onset   Huntington's disease Mother     Prior to Admission medications   Medication Sig Start Date End Date Taking? Authorizing Provider  ergocalciferol (VITAMIN D2) 1.25 MG (50000 UT) capsule Take 50,000 Units by mouth once a week.    [provider]  furosemide (LASIX) 20 MG tablet Take 20 mg by mouth daily.    [provider]  potassium chloride (KLOR-CON) 10 MEQ tablet Take 10 mEq by mouth daily.    [provider]  spironolactone (ALDACTONE) 50 MG tablet Take 100 mg by mouth daily.    [provider]    Physical Exam: Vitals:   06/09/21 1256 06/09/21 1300 06/09/21 1442 06/09/21 1633  BP: 114/68 (!) 109/94 (!) 126/91 128/69  Pulse: (!) 102 (!) 101 (!) 105 (!) 107  Resp: 16  18 20   Temp:    99.4 F (37.4 C)  TempSrc:    Oral  SpO2: 98% 100% 97%   Weight:    75.5 kg  Height:    5\' 7"  (1.702 m)      Constitutional: NAD, calm, comfortable Eyes: PERRL, lids and conjunctivae normal ENMT: Mucous membranes are moist. Posterior  pharynx clear of any exudate or lesions.Normal dentition.  Neck: normal, supple, no masses, no thyromegaly Respiratory: left sided lung dullness. Normal respiratory effort. No accessory muscle use.  Cardiovascular: Regular rate and rhythm, no murmurs / rubs / gallops. No extremity edema. 2+ pedal pulses.  Abdomen: no tenderness, no masses palpated. Bowel sounds positive.  Musculoskeletal: no clubbing / cyanosis. Normal muscle tone.  Skin: no rashes, lesions, ulcers. No induration Neurologic: CN 2-12 grossly intact. Strength 5/5 in all 4.  Psychiatric: Normal judgment and insight. Alert and oriented x 3. Normal mood.   Labs on Admission: I have personally reviewed following labs  and imaging studies  CBC: Recent Labs  Lab 06/09/21 1035  WBC 9.8  HGB 12.3  HCT 36.0  MCV 101.1*  PLT [id number]   Basic Metabolic Panel: Recent Labs  Lab 06/09/21 1035 06/09/21 1314  NA 133*  --   K 4.2  --   CL 99  --   CO2 23  --   GLUCOSE 103*  --   BUN 15  --   CREATININE 0.79  --   CALCIUM 10.1  --   MG  --  1.8   GFR: Estimated Creatinine Clearance: 93.2 mL/min (by C-G formula based on SCr of 0.79 mg/dL). Liver Function Tests: Recent Labs  Lab 06/09/21 1035  AST 43*  ALT 22  ALKPHOS 127*  BILITOT 9.5*  PROT 6.8  ALBUMIN 3.5   No results for input(s): LIPASE, AMYLASE in the last 168 hours. No results for input(s): AMMONIA in the last 168 hours. Coagulation Profile: Recent Labs  Lab 06/09/21 1305  INR 1.4*   Cardiac Enzymes: No results for input(s): CKTOTAL, CKMB, CKMBINDEX, TROPONINI in the last 168 hours. BNP (last 3 results) No results for input(s): PROBNP in the last 8760 hours. HbA1C: No results for input(s): HGBA1C in the last 72 hours. CBG: No results for input(s): GLUCAP in the last 168 hours. Lipid Profile: No results for input(s): CHOL, HDL, LDLCALC, TRIG, CHOLHDL, LDLDIRECT in the last 72 hours. Thyroid Function Tests: No results for input(s): TSH, T4TOTAL, FREET4, T3FREE, THYROIDAB in the last 72 hours. Anemia Panel: No results for input(s): VITAMINB12, FOLATE, FERRITIN, TIBC, IRON, RETICCTPCT in the last 72 hours. Urine analysis:    Component Value Date/Time   COLORURINE YELLOW 06/09/2021 1003   APPEARANCEUR CLEAR 06/09/2021 1003   LABSPEC 1.010 06/09/2021 1003   PHURINE 5.0 06/09/2021 1003   GLUCOSEU NEGATIVE 06/09/2021 1003   HGBUR MODERATE (A) 06/09/2021 1003   BILIRUBINUR NEGATIVE 06/09/2021 1003   KETONESUR NEGATIVE 06/09/2021 1003   PROTEINUR NEGATIVE 06/09/2021 1003   NITRITE NEGATIVE 06/09/2021 1003   LEUKOCYTESUR NEGATIVE 06/09/2021 1003     Radiological Exams on Admission: DG Chest Port 1 View  Result Date:  06/09/2021 CLINICAL DATA:  Shortness of breath. EXAM: PORTABLE CHEST 1 VIEW COMPARISON:  01/06/2021 FINDINGS: There is a massive left pleural effusion with extensive volume loss throughout the left lung. Only a small portion of the left lung is aerated in the left upper chest. Right lung is clear. There is some mediastinal shift towards the right due to large left pleural effusion. IMPRESSION: Very large left pleural effusion with extensive volume loss in the left lung. Electronically Signed   By: Markus Daft M.D.   On: 06/09/2021 12:26   CT Renal Stone Study  Result Date: 06/09/2021 CLINICAL DATA:  Hematuria, unknown cause.  Left flank pain. EXAM: CT ABDOMEN AND PELVIS WITHOUT CONTRAST TECHNIQUE: Multidetector CT imaging of the  abdomen and pelvis was performed following the standard protocol without IV contrast. COMPARISON:  10/29/2020 FINDINGS: Lower chest: There is a very large left pleural effusion with collapse of the left lower lobe. There is also volume loss in the left upper lobe but the entire chest is not imaged. Negative for right pleural effusion. There is some mediastinal shift towards the right due to the large left pleural effusion. Hepatobiliary: Again noted is nodular contour and compatible with cirrhosis. Small amount of perihepatic ascites. Gallstones without gallbladder distension. Pancreas: Unremarkable. No pancreatic ductal dilatation or surrounding inflammatory changes. Spleen: Spleen is prominent for size and similar to the previous examination. Adrenals/Urinary Tract: Normal appearance of the adrenal glands. Negative for kidney stones or hydronephrosis. Fluid in the urinary bladder. Stomach/Bowel: Normal appearance of the stomach. No evidence for bowel distention or acute bowel inflammation. Vascular/Lymphatic: Again noted are diffusely enlarged venous structures throughout the abdomen particularly in the left upper quadrant around the left kidney and spleen. Patient also has a prominent  umbilical vein. These findings are compatible with portal hypertension. Reproductive: Uterus and bilateral adnexa are unremarkable. Other: Small amount of ascites in the pelvis and a small amount of ascites in the upper quadrants. Diffuse stranding throughout the abdominal mesentery similar to the previous examination and probably related to venous congestion from the portal hypertension. Musculoskeletal: No acute bone abnormality. IMPRESSION: 1. Large left pleural effusion with extensive volume loss in the left lung and mediastinal shift towards the right. Left pleural effusion etiology could be associated with a hepatic hydrothorax. 2. Cirrhosis with evidence of portal hypertension demonstrated by splenomegaly and enlarged venous structures throughout the abdomen and small amount of ascites. 3. Negative for kidney stones or hydronephrosis. 4. Cholelithiasis. Electronically Signed   By: Richarda Overlie M.D.   On: 06/09/2021 12:24      Assessment/Plan Principal Problem:   Pleural effusion on left Active Problems:   Cirrhosis of liver with ascites (HCC)   Hyponatremia   Portal hypertension (HCC)   SOB (shortness of breath)   Flank pain   Hematuria   SOB due to large lefts sided pleural effusion -suspect will need thoracentesis but with prior bad experience will need to be pre-medicated with ativan if needed -discussed with patient that overnight we can try a dose of IV lasix and repeat x ray in AM to see if better prior to getting IR involved -appears that in July her pleural effusion got worse when her lasix was held -she did increase her fluid intake when she was told in November she had a kidney stone -last thoracentesis was 4/22: 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  -check CK  Mild hyponatremia -monitor   DVT prophylaxis: scd  Code Status: full  Family Communication: at bedside Disposition Plan: home after improvement in pleural effusion Consults called: none    Admission status: obs for now  At the point of initial evaluation, it is my clinical opinion that admission for OBSERVATION is reasonable and necessary because the patient's presenting complaints in the context of their chronic conditions represent sufficient risk of deterioration or significant morbidity to constitute reasonable grounds for close observation in the hospital  setting, but that the patient may be medically stable for discharge from the hospital within 24 to 48 hours.   Geradine Girt [facility]   How to contact the [facility] Attending or Consulting provider [place] or covering provider during after hours [place], for this patient?  Check the care team in [facility] and look for a) attending/consulting TR[facility] provider listed and b) the [facility] team listed Log into [email or web address] and use [place]'s universal password to access. If you do not have the password, please contact the hospital operator. Locate the [facility] provider you are looking for under [facility] and page to a number that you can be directly reached. If you still have difficulty reaching the provider, please page the [facility] (Director on Call) for the Hospitalists listed on amion for assistance.  06/09/2021, 5:25 PM

## 2021-06-09 NOTE — ED Triage Notes (Signed)
Pt developed left flank pain on Nov 25th, went to UC on 11/26 hematuria noted and started on Keflex. Saw PCP yesterday who order CT and other test.

## 2021-06-09 NOTE — ED Notes (Signed)
Patient transported to X-ray and CT 

## 2021-06-10 ENCOUNTER — Observation Stay (HOSPITAL_COMMUNITY): Payer: Managed Care, Other (non HMO)

## 2021-06-10 DIAGNOSIS — J90 Pleural effusion, not elsewhere classified: Principal | ICD-10-CM

## 2021-06-10 LAB — BODY FLUID CELL COUNT WITH DIFFERENTIAL
Eos, Fluid: 1 %
Lymphs, Fluid: 49 %
Monocyte-Macrophage-Serous Fluid: 37 % — ABNORMAL LOW (ref 50–90)
Neutrophil Count, Fluid: 13 % (ref 0–25)
Total Nucleated Cell Count, Fluid: 425 cu mm (ref 0–1000)

## 2021-06-10 LAB — CBC
HCT: 32.1 % — ABNORMAL LOW (ref 36.0–46.0)
Hemoglobin: 11.1 g/dL — ABNORMAL LOW (ref 12.0–15.0)
MCH: 34.6 pg — ABNORMAL HIGH (ref 26.0–34.0)
MCHC: 34.6 g/dL (ref 30.0–36.0)
MCV: 100 fL (ref 80.0–100.0)
Platelets: 148 10*3/uL — ABNORMAL LOW (ref 150–400)
RBC: 3.21 MIL/uL — ABNORMAL LOW (ref 3.87–5.11)
RDW: 12.3 % (ref 11.5–15.5)
WBC: 10.2 10*3/uL (ref 4.0–10.5)
nRBC: 0 % (ref 0.0–0.2)

## 2021-06-10 LAB — COMPREHENSIVE METABOLIC PANEL
ALT: 23 U/L (ref 0–44)
AST: 42 U/L — ABNORMAL HIGH (ref 15–41)
Albumin: 2.6 g/dL — ABNORMAL LOW (ref 3.5–5.0)
Alkaline Phosphatase: 125 U/L (ref 38–126)
Anion gap: 10 (ref 5–15)
BUN: 12 mg/dL (ref 6–20)
CO2: 23 mmol/L (ref 22–32)
Calcium: 8.7 mg/dL — ABNORMAL LOW (ref 8.9–10.3)
Chloride: 95 mmol/L — ABNORMAL LOW (ref 98–111)
Creatinine, Ser: 0.89 mg/dL (ref 0.44–1.00)
GFR, Estimated: >60 mL/min (ref >60)
Glucose, Bld: 109 mg/dL — ABNORMAL HIGH (ref 70–99)
Potassium: 3.8 mmol/L (ref 3.5–5.1)
Sodium: 128 mmol/L — ABNORMAL LOW (ref 135–145)
Total Bilirubin: 7.2 mg/dL — ABNORMAL HIGH (ref 0.3–1.2)
Total Protein: 6.2 g/dL — ABNORMAL LOW (ref 6.5–8.1)

## 2021-06-10 LAB — PROTIME-INR
INR: 1.6 — ABNORMAL HIGH (ref 0.8–1.2)
Prothrombin Time: 19.1 seconds — ABNORMAL HIGH (ref 11.4–15.2)

## 2021-06-10 LAB — CK: Total CK: 34 U/L — ABNORMAL LOW (ref 38–234)

## 2021-06-10 LAB — LACTATE DEHYDROGENASE: LDH: 157 U/L (ref 98–192)

## 2021-06-10 MED ORDER — FUROSEMIDE 20 MG PO TABS
20.0000 mg | ORAL_TABLET | Freq: Every morning | ORAL | Status: DC
  Administered 2021-06-10 – 2021-06-11 (×2): 20 mg via ORAL
  Filled 2021-06-10 (×2): qty 1

## 2021-06-10 MED ORDER — LIDOCAINE HCL (PF) 1 % IJ SOLN
INTRAMUSCULAR | Status: AC
  Filled 2021-06-10: qty 30

## 2021-06-10 MED ORDER — LORAZEPAM 2 MG/ML IJ SOLN
0.2500 mg | Freq: Once | INTRAMUSCULAR | Status: AC
  Administered 2021-06-10: 10:00:00 0.25 mg via INTRAVENOUS
  Filled 2021-06-10: qty 1

## 2021-06-10 MED ORDER — POTASSIUM CHLORIDE CRYS ER 20 MEQ PO TBCR
20.0000 meq | EXTENDED_RELEASE_TABLET | Freq: Every morning | ORAL | Status: DC
  Administered 2021-06-10 – 2021-06-11 (×2): 20 meq via ORAL
  Filled 2021-06-10: qty 2
  Filled 2021-06-10: qty 1
  Filled 2021-06-10 (×2): qty 2

## 2021-06-10 MED ORDER — SPIRONOLACTONE 25 MG PO TABS
50.0000 mg | ORAL_TABLET | Freq: Every day | ORAL | Status: DC
  Administered 2021-06-10 – 2021-06-11 (×2): 50 mg via ORAL
  Filled 2021-06-10: qty 2

## 2021-06-10 NOTE — Procedures (Signed)
PROCEDURE SUMMARY:  Successful US guided left thoracentesis. Yielded 1.8L of dark bloody pleural fluid. Pt tolerated procedure well. No immediate complications.  Specimen was sent for labs. CXR ordered.  EBL < 5 mL  Charles Andringa PA-C 06/10/2021 10:40 AM

## 2021-06-10 NOTE — Consult Note (Signed)
NAME:  Rebecca Bonilla, MRN:  [id number], DOB:  July 24, 1975, LOS: 0 ADMISSION DATE:  06/09/2021, CONSULTATION DATE:  06/10/2021 REFERRING MD:  Dr. Eliseo Squires, CHIEF COMPLAINT:  Pleural effusion    History of Present Illness:  Rebecca Bonilla is a 45 y.o. female with a PMH significant for cirrhosis secondary to ETOH use (on transplant list at [facility]), portal hypertension, former smoker, and hyponatremia who presented to the ED for complaints of left flank pain and SOB. Patient was seen at urgent care 12/2 for complaints of left flank pain and was seen with hematuria and diagnosed with kidney stone and discharged on Keflex. When pain worsened and SOB began she presented to this ED for further evaluation.   Of note patient was seen at this facility 10/29/20-10/30/2020 where she was treated for large left pleural effusion. IR preformed left thoracentesis and removed 622ml of bloody fluid was removed. Fluids was exudative by Lights. She was seen by outpatient pulmonary with [facility] who felt effusion was secondary to hepato hydrothorax.  On admission patient was seen hemodynamically stable on RA. CT renal study obtained and revealed large left pleural effusion with extensive volume loss and mediastinal shift, cirrhosis with evidence of portal hypertension. IR was consulted and patient underwent thoracentesis with 1.8L dark bloody fluid removed. Given prior evidence of exudative effusion by lights and reocumulation of fluid effusion pulmonary was consulted.   Pertinent  Medical History  Cirrhosis secondary to ETOH use (on transplant list at [facility]), portal hypertension, former smoker, and hyponatremia  Significant Hospital Events: Including procedures, antibiotic start and stop dates in addition to other pertinent events   12/4 Admitted for left flank pain and SOB, Seen with large left pleural effusion. Underwent thora by IR with 1.8L bloody fluid removed   Interim History / Subjective:  Seen lying  in bed with no acute complaints   Objective   Blood pressure 113/69, pulse 94, temperature 98.5 F (36.9 C), temperature source Oral, resp. rate 18, height 5\' 7"  (1.702 m), weight 74.6 kg, SpO2 96 %.        Intake/Output Summary (Last 24 hours) at 06/10/2021 1258 Last data filed at 06/10/2021 0700 Gross per 24 hour  Intake 600 ml  Output 1500 ml  Net -900 ml   Filed Weights   06/09/21 1002 06/09/21 1633 06/10/21 0551  Weight: 76.2 kg 75.5 kg 74.6 kg    Examination: General: Very pleasant adult female sitting up in bed in NAD HEENT: [place]/AT, MM pink/moist, PERRL,  Neuro: Alert and oriented x3 CV: s1s2 regular rate and rhythm, no murmur, rubs, or gallops,  PULM:  Diminished left side, no increased work of breathing, on RA GI: soft, bowel sounds active in all 4 quadrants, non-tender, non-distended, tolerating oral diet  Extremities: warm/dry, no edema  Skin: no rashes or lesions  Resolved Hospital Problem list     Assessment & Plan:  Large left pleural effusion -Patient presented with left flank pain and SOB. CT kidney stone done on ED arrival and revealed large left pleural effusion  -IR preformed thoracentesis 12/4 with 1.8L of bloody fluid removed  -Patient underwent last thoracentesis at this facility 4/25 with 631ml of bloody fludi removed. Exudative by [facility] criteria   P: Follow pleural fluid studies  Repeat CXR in am  Post thora CXR continues to reveal significant pleural effusion, will likely need repeat thora tomorrow  Could consider possible pleural biopsy if effusion is again see as exudative  If effusion continues to reoccumulate can consider  possible PleurX cath   Best Practice (right click and "Reselect all SmartList Selections" daily)  Per primary   Labs   CBC: Recent Labs  Lab 06/09/21 1035 06/10/21 0402  WBC 9.8 10.2  HGB 12.3 11.1*  HCT 36.0 32.1*  MCV 101.1* 100.0  PLT 151 148*    Basic Metabolic Panel: Recent Labs  Lab 06/09/21 1035  06/09/21 1314 06/10/21 0402  NA 133*  --  128*  K 4.2  --  3.8  CL 99  --  95*  CO2 23  --  23  GLUCOSE 103*  --  109*  BUN 15  --  12  CREATININE 0.79  --  0.89  CALCIUM 10.1  --  8.7*  MG  --  1.8  --    GFR: Estimated Creatinine Clearance: 83.3 mL/min (by C-G formula based on SCr of 0.89 mg/dL). Recent Labs  Lab 06/09/21 1035 06/10/21 0402  WBC 9.8 10.2    Liver Function Tests: Recent Labs  Lab 06/09/21 1035 06/10/21 0402  AST 43* 42*  ALT 22 23  ALKPHOS 127* 125  BILITOT 9.5* 7.2*  PROT 6.8 6.2*  ALBUMIN 3.5 2.6*   No results for input(s): LIPASE, AMYLASE in the last 168 hours. No results for input(s): AMMONIA in the last 168 hours.  ABG No results found for: PHART, PCO2ART, PO2ART, HCO3, TCO2, ACIDBASEDEF, O2SAT   Coagulation Profile: Recent Labs  Lab 06/09/21 1305 06/10/21 0402  INR 1.4* 1.6*    Cardiac Enzymes: Recent Labs  Lab 06/10/21 0402  CKTOTAL 34*    HbA1C: No results found for: HGBA1C  CBG: No results for input(s): GLUCAP in the last 168 hours.  Review of Systems:   Please see the history of present illness. All other systems reviewed and are negative   Past Medical History:  She,  has a past medical history of Alcohol abuse, Cirrhosis of liver with ascites ([place]) (08/14/2020), Hyponatremia (08/15/2020), Macrocytosis (10/29/2020), and Portal hypertension ([place]) (08/14/2020).   Surgical History:   Past Surgical History:  Procedure Laterality Date   ANKLE SURGERY     x 2     Social History:   reports that she has quit smoking. Her smoking use included cigarettes. She has never used smokeless tobacco. She reports that she does not currently use alcohol. She reports that she does not use drugs.   Family History:  Her family history includes Huntington's disease in her mother.   Allergies Allergies  Allergen Reactions   Clemastine Rash         Hydrocodone Nausea And Vomiting   Other Other (See Comments)    Alberts[employer] dayhist -  d  - unknown reaction (reaction to antihistamine that is no longer on the market)   Oxycodone Nausea Only    Nausea to opioids       Home Medications  Prior to Admission medications   Medication Sig Start Date End Date Taking? Authorizing Provider  acetaminophen (TYLENOL) 160 MG chewable tablet Chew 160 mg by mouth daily as needed for pain.   Yes [provider]  cephALEXin (KEFLEX) 500 MG capsule Take 500 mg by mouth 2 (two) times daily. 06/03/21  Yes [provider]  clobetasol ointment (TEMOVATE) [id number] % Apply 1 application topically 2 (two) times daily as needed (psoriasis). 03/06/21  Yes [provider]  furosemide (LASIX) 20 MG tablet Take 20 mg by mouth every morning.   Yes [provider]  lidocaine (XYLOCAINE) 2 % solution Use as directed  5 mLs in the mouth or throat every 3 (three) hours as needed for mouth pain (tooth pain). 06/05/21  Yes [provider]  Potassium Chloride ER 20 MEQ TBCR Take 20 mEq by mouth every morning. 05/05/21  Yes [provider]  spironolactone (ALDACTONE) 50 MG tablet Take 50 mg by mouth every morning.   Yes [provider]  triamcinolone cream (KENALOG) 0.1 % Apply 1 application topically daily as needed (psoriasis). 04/19/21  Yes [provider]  ergocalciferol (VITAMIN D2) 1.25 MG (50000 UT) capsule Take 50,000 Units by mouth once a week. Patient not taking: Reported on 06/09/2021    [provider]     Critical care time: NA   Mekenzie Modeste D. Tiburcio Pea, NP-C [facility] Personal contact information can be found on Amion  06/10/2021, 1:35 PM

## 2021-06-10 NOTE — Progress Notes (Signed)
Progress Note    Rebecca Bonilla  [id number] DOB: 1976-01-22  DOA: 06/09/2021 PCP: Lilian Coma., MD    Brief Narrative:    Medical records reviewed and are as summarized below:  Rebecca Bonilla is an 45 y.o. 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  1 week ago she had left flank pain went to an urgent care and was diagnosed with kidney stone and started on abx.  She continued to have discomfort.  Also she developed worsening SOB that she attributed to irritant exposure while making hot sauce.  In the ER she had imaging that showed no renal stone but did show large pleural effusion on the left.  Of note, patient was hospitalized prior with pleural effusion in 4/22 and underwent a thoracentesis.  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  Based on protein (do not have LDH) her effusion was exudative.  She has continued to have varying amounts of fluid since then: 7/2: large, 8/30: Small left pleural effusion , 9/27: Moderate-sized left pleural effusion with associated left basilar opacity consistent with atelectasis When she was diagnosed with the kidney stone she increased her PO fluid intake.  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, 700cc removed- review of fluid shows it to be bloody and exudative -continued to have varying amounts of fluid since then: 7/2: large, 8/30: Small left pleural effusion , 9/27: Moderate-sized left pleural effusion with associated left basilar opacity  consistent with atelectasis -though by outpatient pulm to be hepatic hydrothorax -repeat thoracentesis done 12/4 with 1.8L removed with minimal improvement on x ray -initially did not order labs to be done as 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 but since was bloody and exudative last time, I have added on studies including cytology, LDH, protein so determination of trans/exudate can be made -pulm consult as unclear etiology -no large volume abdominal ascites on exam- minimal per CT scan   Cirrhosis of liver with protal HTN -on transplant list at [facility]    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  -CK negative   Mild hyponatremia -monitor    Family Communication/Anticipated D/C date and plan/Code Status   DVT prophylaxis:  scd Code Status: Full Code.  Family Communication: at bedside Disposition Plan: Status is: Observation  The patient will require care spanning > 2 midnights and should be moved to inpatient because: may need repeat thoracentesis        Medical Consultants:   pulm  Subjective:   Tolerated procedure well  Objective:    Vitals:   06/10/21 1005 06/10/21 1010 06/10/21 1015 06/10/21 1020  BP: 113/68 120/80 109/65 113/69  Pulse:      Resp:      Temp:      TempSrc:      SpO2:  Weight:      Height:        Intake/Output Summary (Last 24 hours) at 06/10/2021 1551 Last data filed at 06/10/2021 0800 Gross per 24 hour  Intake 960 ml  Output 1500 ml  Net -540 ml   Filed Weights   06/09/21 1002 06/09/21 1633 06/10/21 0551  Weight: 76.2 kg 75.5 kg 74.6 kg    Exam:  General: Appearance:     Overweight female in no acute distress     Lungs:      respirations unlabored, + dry cough, slight improved aeration in left lung   Heart:    Normal heart rate.     MS:   All extremities are intact.    Neurologic:   Awake, alert, oriented x 3. No apparent focal neurological           defect.      Data Reviewed:   I have personally reviewed following labs and imaging studies:  Labs: Labs show the following:   Basic Metabolic Panel: Recent Labs  Lab 06/09/21 1035 06/09/21 1314 06/10/21 0402  NA 133*  --  128*  K 4.2  --  3.8  CL 99  --  95*  CO2 23  --  23  GLUCOSE 103*  --  109*  BUN 15  --  12  CREATININE 0.79  --  0.89  CALCIUM 10.1  --  8.7*  MG  --  1.8  --    GFR Estimated Creatinine Clearance: 83.3 mL/min (by C-G formula based on SCr of 0.89 mg/dL). Liver Function Tests: Recent Labs  Lab 06/09/21 1035 06/10/21 0402  AST 43* 42*  ALT 22 23  ALKPHOS 127* 125  BILITOT 9.5* 7.2*  PROT 6.8 6.2*  ALBUMIN 3.5 2.6*   No results for input(s): LIPASE, AMYLASE in the last 168 hours. No results for input(s): AMMONIA in the last 168 hours. Coagulation profile Recent Labs  Lab 06/09/21 1305 06/10/21 0402  INR 1.4* 1.6*    CBC: Recent Labs  Lab 06/09/21 1035 06/10/21 0402  WBC 9.8 10.2  HGB 12.3 11.1*  HCT 36.0 32.1*  MCV 101.1* 100.0  PLT 151 148*   Cardiac Enzymes: Recent Labs  Lab 06/10/21 0402  CKTOTAL 34*   BNP (last 3 results) No results for input(s): PROBNP in the last 8760 hours. CBG: No results for input(s): GLUCAP in the last 168 hours. D-Dimer: No results for input(s): DDIMER in the last 72 hours. Hgb A1c: No results for input(s): HGBA1C in the last 72 hours. Lipid Profile: No results for input(s): CHOL, HDL, LDLCALC, TRIG, CHOLHDL, LDLDIRECT in the last 72 hours. Thyroid function studies: No results for input(s): TSH, T4TOTAL, T3FREE, THYROIDAB in the last 72 hours.  Invalid input(s): FREET3 Anemia work up: No results for input(s): VITAMINB12, FOLATE, FERRITIN, TIBC, IRON, RETICCTPCT in the last 72 hours. Sepsis Labs: Recent Labs  Lab 06/09/21 1035  06/10/21 0402  WBC 9.8 10.2    Microbiology Recent Results (from the past 240 hour(s))  Resp Panel by RT-PCR (Flu A&B, Covid) Nasopharyngeal Swab     Status: None   Collection Time: 06/09/21  1:14 PM   Specimen: Nasopharyngeal Swab; Nasopharyngeal(NP) swabs in vial transport medium  Result Value Ref Range Status   SARS Coronavirus 2 by RT PCR NEGATIVE NEGATIVE Final    Comment: (NOTE) SARS-CoV-2 target nucleic acids are NOT DETECTED.  The SARS-CoV-2 RNA is generally detectable in upper respiratory specimens during the acute phase of infection. The lowest  concentration of SARS-CoV-2 viral copies this assay can detect is 138 copies/mL. A negative result does not preclude SARS-Cov-2 infection and should not be used as the sole basis for treatment or other patient management decisions. A negative result may occur with  improper specimen collection/handling, submission of specimen other than nasopharyngeal swab, presence of viral mutation(s) within the areas targeted by this assay, and inadequate number of viral copies(<138 copies/mL). A negative result must be combined with clinical observations, patient history, and epidemiological information. The expected result is Negative.  Fact Sheet for Patients:  BloggerCourse.com  Fact Sheet for Healthcare Providers:  SeriousBroker.it  This test is no t yet approved or cleared by the [place] FDA and  has been authorized for detection and/or diagnosis of SARS-CoV-2 by FDA under an Emergency Use Authorization (EUA). This EUA will remain  in effect (meaning this test can be used) for the duration of the COVID-19 declaration under Section 564(b)(1) of the Act, 21 U.S.C.section 360bbb-3(b)(1), unless the authorization is terminated  or revoked sooner.       Influenza A by PCR NEGATIVE NEGATIVE Final   Influenza B by PCR NEGATIVE NEGATIVE Final    Comment: (NOTE) The Xpert Xpress  SARS-CoV-2/FLU/RSV plus assay is intended as an aid in the diagnosis of influenza from Nasopharyngeal swab specimens and should not be used as a sole basis for treatment. Nasal washings and aspirates are unacceptable for Xpert Xpress SARS-CoV-2/FLU/RSV testing.  Fact Sheet for Patients: BloggerCourse.com  Fact Sheet for Healthcare Providers: SeriousBroker.it  This test is not yet approved or cleared by the [place] FDA and has been authorized for detection and/or diagnosis of SARS-CoV-2 by FDA under an Emergency Use Authorization (EUA). This EUA will remain in effect (meaning this test can be used) for the duration of the COVID-19 declaration under Section 564(b)(1) of the Act, 21 U.S.C. section 360bbb-3(b)(1), unless the authorization is terminated or revoked.  Performed at [facility], [address], [place], [place] [address]     Procedures and diagnostic studies:  DG Chest 1 View  Result Date: 06/10/2021 CLINICAL DATA:  Status post left thoracentesis EXAM: CHEST  1 VIEW COMPARISON:  06/10/2021 FINDINGS: Minimal decrease in volume of a large left pleural effusion with slight improvement in aeration of the left upper lobe. The right lung is normally aerated. The cardiopericardial silhouette is predominantly obscured. IMPRESSION: Minimal decrease in volume of a large left pleural effusion with slight improvement in aeration of the left upper lobe. No pneumothorax. Electronically Signed   By: Jearld Lesch M.D.   On: 06/10/2021 11:30   DG Chest 2 View  Result Date: 06/10/2021 CLINICAL DATA:  Shortness of breath. EXAM: CHEST - 2 VIEW COMPARISON:  06/09/2021 FINDINGS: Large left pleural effusion again noted. Right lung clear. Stable heart size. The visualized bony structures of the thorax show no acute abnormality. Telemetry leads overlie the chest. IMPRESSION: Large left pleural effusion. Electronically Signed    By: Kennith Center M.D.   On: 06/10/2021 08:33   DG Chest Port 1 View  Result Date: 06/09/2021 CLINICAL DATA:  Shortness of breath. EXAM: PORTABLE CHEST 1 VIEW COMPARISON:  01/06/2021 FINDINGS: There is a massive left pleural effusion with extensive volume loss throughout the left lung. Only a small portion of the left lung is aerated in the left upper chest. Right lung is clear. There is some mediastinal shift towards the right due to large left pleural effusion. IMPRESSION: Very large left pleural effusion with extensive volume loss  in the left lung. Electronically Signed   By: Markus Daft M.D.   On: 06/09/2021 12:26   CT Renal Stone Study  Result Date: 06/09/2021 CLINICAL DATA:  Hematuria, unknown cause.  Left flank pain. EXAM: CT ABDOMEN AND PELVIS WITHOUT CONTRAST TECHNIQUE: Multidetector CT imaging of the abdomen and pelvis was performed following the standard protocol without IV contrast. COMPARISON:  10/29/2020 FINDINGS: Lower chest: There is a very large left pleural effusion with collapse of the left lower lobe. There is also volume loss in the left upper lobe but the entire chest is not imaged. Negative for right pleural effusion. There is some mediastinal shift towards the right due to the large left pleural effusion. Hepatobiliary: Again noted is nodular contour and compatible with cirrhosis. Small amount of perihepatic ascites. Gallstones without gallbladder distension. Pancreas: Unremarkable. No pancreatic ductal dilatation or surrounding inflammatory changes. Spleen: Spleen is prominent for size and similar to the previous examination. Adrenals/Urinary Tract: Normal appearance of the adrenal glands. Negative for kidney stones or hydronephrosis. Fluid in the urinary bladder. Stomach/Bowel: Normal appearance of the stomach. No evidence for bowel distention or acute bowel inflammation. Vascular/Lymphatic: Again noted are diffusely enlarged venous structures throughout the abdomen particularly in  the left upper quadrant around the left kidney and spleen. Patient also has a prominent umbilical vein. These findings are compatible with portal hypertension. Reproductive: Uterus and bilateral adnexa are unremarkable. Other: Small amount of ascites in the pelvis and a small amount of ascites in the upper quadrants. Diffuse stranding throughout the abdominal mesentery similar to the previous examination and probably related to venous congestion from the portal hypertension. Musculoskeletal: No acute bone abnormality. IMPRESSION: 1. Large left pleural effusion with extensive volume loss in the left lung and mediastinal shift towards the right. Left pleural effusion etiology could be associated with a hepatic hydrothorax. 2. Cirrhosis with evidence of portal hypertension demonstrated by splenomegaly and enlarged venous structures throughout the abdomen and small amount of ascites. 3. Negative for kidney stones or hydronephrosis. 4. Cholelithiasis. Electronically Signed   By: Markus Daft M.D.   On: 06/09/2021 12:24   US THORACENTESIS ASP PLEURAL SPACE W/IMG GUIDE  Result Date: 06/10/2021 INDICATION: History of cirrhosis, history of hepatic hydrothorax, recurrent pleural effusion EXAM: ULTRASOUND GUIDED LEFT THORACENTESIS MEDICATIONS: None. COMPLICATIONS: None immediate. PROCEDURE: An ultrasound guided thoracentesis was thoroughly discussed with the patient and questions answered. The benefits, risks, alternatives and complications were also discussed. The patient understands and wishes to proceed with the procedure. Written consent was obtained. Ultrasound was performed to localize and mark an adequate pocket of fluid in the left chest. The area was then prepped and draped in the normal sterile fashion. 1% Lidocaine was used for local anesthesia. Under ultrasound guidance a 6 Fr Safe-T-Centesis catheter was introduced. Thoracentesis was performed. The catheter was removed and a dressing applied. FINDINGS: A total  of approximately 1.8 of dark bloody pleural fluid was removed. Samples were sent to the laboratory as requested by the clinical team. IMPRESSION: Successful ultrasound guided left thoracentesis yielding 1.8L of pleural fluid. Electronically Signed   By: Jacqulynn Cadet M.D.   On: 06/10/2021 11:18    Medications:    furosemide  20 mg Oral q morning   lidocaine (PF)       potassium chloride  20 mEq Oral q morning   spironolactone  50 mg Oral Daily   Vitamin D (Ergocalciferol)  50,000 Units Oral Weekly   Continuous Infusions:   LOS: 0 days  Joseph Art  [facility]   How to contact the [facility] Attending or Consulting provider 7A - 7P or covering provider during after hours 7P -7A, for this patient?  Check the care team in [facility] and look for a) attending/consulting [facility] provider listed and b) the [facility] team listed Log into [email or web address] and use [facility]'s universal password to access. If you do not have the password, please contact the hospital operator. Locate the [facility] provider you are looking for under [facility] and page to a number that you can be directly reached. If you still have difficulty reaching the provider, please page the [facility] (Director on Call) for the Hospitalists listed on amion for assistance.  06/10/2021, 3:51 PM

## 2021-06-11 ENCOUNTER — Observation Stay (HOSPITAL_COMMUNITY): Payer: Managed Care, Other (non HMO)

## 2021-06-11 DIAGNOSIS — J90 Pleural effusion, not elsewhere classified: Secondary | ICD-10-CM | POA: Diagnosis not present

## 2021-06-11 LAB — PROTEIN, PLEURAL OR PERITONEAL FLUID: Total protein, fluid: 4.2 g/dL

## 2021-06-11 LAB — BASIC METABOLIC PANEL
Anion gap: 7 (ref 5–15)
BUN: 8 mg/dL (ref 6–20)
CO2: 26 mmol/L (ref 22–32)
Calcium: 8.9 mg/dL (ref 8.9–10.3)
Chloride: 98 mmol/L (ref 98–111)
Creatinine, Ser: 0.87 mg/dL (ref 0.44–1.00)
GFR, Estimated: >60 mL/min (ref >60)
Glucose, Bld: 98 mg/dL (ref 70–99)
Potassium: 4.7 mmol/L (ref 3.5–5.1)
Sodium: 131 mmol/L — ABNORMAL LOW (ref 135–145)

## 2021-06-11 LAB — PATHOLOGIST SMEAR REVIEW

## 2021-06-11 NOTE — Discharge Summary (Signed)
Physician Discharge Summary  Rebecca Bonilla [id number] DOB: 1976/05/27 DOA: 06/09/2021  PCP: Lilian Coma., MD  Admit date: 06/09/2021 Discharge date: 06/11/2021  Admitted From: home Discharge disposition: home   Recommendations for Outpatient Follow-Up:   PCCM: will need repeat CXR and consideration for outpatient thora > may be able to set up on Thurs after office visit if needed in hospital  -may need pleural biospy Cytology and labs pending   Discharge Diagnosis:   Principal Problem:   Pleural effusion on left Active Problems:   Cirrhosis of liver with ascites (HCC)   Hyponatremia   Portal hypertension (HCC)   SOB (shortness of breath)   Flank pain   Hematuria    Discharge Condition: Improved.  Diet recommendation: Low sodium, heart healthy  Wound care: None.  Code status: Full.   History of Present Illness:   Lilliauna Mcalpin is a 45 y.o. 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  1 week ago she had left flank pain went to an urgent care and was diagnosed with kidney stone and started on abx.  She continued to have discomfort.  Also she developed worsening SOB that she attributed to irritant exposure while making hot sauce.  In the ER she had imaging that showed no renal stone but did show large pleural effusion on the left.  Of note, patient was hospitalized prior with pleural effusion in 4/22 and underwent a thoracentesis.  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  She has continued to have varying amounts of fluid since then: 7/2: large, 8/30: Small left pleural effusion , 9/27: Moderate-sized left pleural effusion with associated left basilar opacity consistent with atelectasis When she was diagnosed with the kidney stone she increased her PO fluid  intake   Hospital Course by Problem:   Patient presented with left flank pain and SOB. CT kidney stone done on ED arrival and revealed large left pleural effusion.  IR preformed thoracentesis 12/4 with 1.8L of bloody fluid removed. Prior thoracentesis at this facility 4/25 with 695ml of bloody fludi removed. Exudative by Lights criteria, cytology negative.       -follow up pleural studies > protein in process. 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  -CK negative   Mild hyponatremia -monitor    Medical Consultants:   PCCM   Discharge Exam:   Vitals:   06/11/21 0459 06/11/21 0809  BP: 111/70 126/79  Pulse: 99 (!) 103  Resp: 19 19  Temp: 98.6 F (37 C) 98.6 F (37 C)  SpO2: 97% 97%   Vitals:  06/10/21 1020 06/10/21 2030 06/11/21 0459 06/11/21 0809  BP: 113/69 117/71 111/70 126/79  Pulse:  99 99 (!) 103  Resp:  18 19 19   Temp:  98.8 F (37.1 C) 98.6 F (37 C) 98.6 F (37 C)  TempSrc:  Oral Oral Oral  SpO2:  97% 97% 97%  Weight:   72.7 kg   Height:        General exam: Appears calm and comfortable.   The results of significant diagnostics from this hospitalization (including imaging, microbiology, ancillary and laboratory) are listed below for reference.     Procedures  and Diagnostic Studies:   DG Chest 1 View  Result Date: 06/10/2021 CLINICAL DATA:  Status post left thoracentesis EXAM: CHEST  1 VIEW COMPARISON:  06/10/2021 FINDINGS: Minimal decrease in volume of a large left pleural effusion with slight improvement in aeration of the left upper lobe. The right lung is normally aerated. The cardiopericardial silhouette is predominantly obscured. IMPRESSION: Minimal decrease in volume of a large left pleural effusion with slight improvement in aeration of the left upper lobe. No pneumothorax. Electronically Signed   By: Delanna Ahmadi M.D.   On: 06/10/2021 11:30   DG Chest 2 View  Result Date: 06/10/2021 CLINICAL DATA:  Shortness of breath. EXAM: CHEST - 2 VIEW COMPARISON:  06/09/2021 FINDINGS: Large left pleural effusion again noted. Right lung clear. Stable heart size. The visualized bony structures of the thorax show no acute abnormality. Telemetry leads overlie the chest. IMPRESSION: Large left pleural effusion. Electronically Signed   By: Misty Stanley M.D.   On: 06/10/2021 08:33   DG Chest Port 1 View  Result Date: 06/09/2021 CLINICAL DATA:  Shortness of breath. EXAM: PORTABLE CHEST 1 VIEW COMPARISON:  01/06/2021 FINDINGS: There is a massive left pleural effusion with extensive volume loss throughout the left lung. Only a small portion of the left lung is aerated in the left upper chest. Right lung is clear. There is some mediastinal shift towards the right due to large left pleural effusion. IMPRESSION: Very large left pleural effusion with extensive volume loss in the left lung. Electronically Signed   By: Markus Daft M.D.   On: 06/09/2021 12:26   CT Renal Stone Study  Result Date: 06/09/2021 CLINICAL DATA:  Hematuria, unknown cause.  Left flank pain. EXAM: CT ABDOMEN AND PELVIS WITHOUT CONTRAST TECHNIQUE: Multidetector CT imaging of the abdomen and pelvis was performed following the standard protocol without IV contrast. COMPARISON:  10/29/2020 FINDINGS:  Lower chest: There is a very large left pleural effusion with collapse of the left lower lobe. There is also volume loss in the left upper lobe but the entire chest is not imaged. Negative for right pleural effusion. There is some mediastinal shift towards the right due to the large left pleural effusion. Hepatobiliary: Again noted is nodular contour and compatible with cirrhosis. Small amount of perihepatic ascites. Gallstones without gallbladder distension. Pancreas: Unremarkable. No pancreatic ductal dilatation or surrounding inflammatory changes. Spleen: Spleen is prominent for size and similar to the previous examination. Adrenals/Urinary Tract: Normal appearance of the adrenal glands. Negative for kidney stones or hydronephrosis. Fluid in the urinary bladder. Stomach/Bowel: Normal appearance of the stomach. No evidence for bowel distention or acute bowel inflammation. Vascular/Lymphatic: Again noted are diffusely enlarged venous structures throughout the abdomen particularly in the left upper quadrant around the left kidney and spleen. Patient also has a prominent umbilical vein. These findings are compatible with portal hypertension. Reproductive: Uterus and bilateral adnexa are unremarkable. Other: Small  amount of ascites in the pelvis and a small amount of ascites in the upper quadrants. Diffuse stranding throughout the abdominal mesentery similar to the previous examination and probably related to venous congestion from the portal hypertension. Musculoskeletal: No acute bone abnormality. IMPRESSION: 1. Large left pleural effusion with extensive volume loss in the left lung and mediastinal shift towards the right. Left pleural effusion etiology could be associated with a hepatic hydrothorax. 2. Cirrhosis with evidence of portal hypertension demonstrated by splenomegaly and enlarged venous structures throughout the abdomen and small amount of ascites. 3. Negative for kidney stones or hydronephrosis. 4.  Cholelithiasis. Electronically Signed   By: Markus Daft M.D.   On: 06/09/2021 12:24   US THORACENTESIS ASP PLEURAL SPACE W/IMG GUIDE  Result Date: 06/10/2021 INDICATION: History of cirrhosis, history of hepatic hydrothorax, recurrent pleural effusion EXAM: ULTRASOUND GUIDED LEFT THORACENTESIS MEDICATIONS: None. COMPLICATIONS: None immediate. PROCEDURE: An ultrasound guided thoracentesis was thoroughly discussed with the patient and questions answered. The benefits, risks, alternatives and complications were also discussed. The patient understands and wishes to proceed with the procedure. Written consent was obtained. Ultrasound was performed to localize and mark an adequate pocket of fluid in the left chest. The area was then prepped and draped in the normal sterile fashion. 1% Lidocaine was used for local anesthesia. Under ultrasound guidance a 6 Fr Safe-T-Centesis catheter was introduced. Thoracentesis was performed. The catheter was removed and a dressing applied. FINDINGS: A total of approximately 1.8 of dark bloody pleural fluid was removed. Samples were sent to the laboratory as requested by the clinical team. IMPRESSION: Successful ultrasound guided left thoracentesis yielding 1.8L of pleural fluid. Electronically Signed   By: Jacqulynn Cadet M.D.   On: 06/10/2021 11:18     Labs:   Basic Metabolic Panel: Recent Labs  Lab 06/09/21 1035 06/09/21 1314 06/10/21 0402 06/11/21 0258  NA 133*  --  128* 131*  K 4.2  --  3.8 4.7  CL 99  --  95* 98  CO2 23  --  23 26  GLUCOSE 103*  --  109* 98  BUN 15  --  12 8  CREATININE 0.79  --  0.89 0.87  CALCIUM 10.1  --  8.7* 8.9  MG  --  1.8  --   --    GFR Estimated Creatinine Clearance: 78.6 mL/min (by C-G formula based on SCr of 0.87 mg/dL). Liver Function Tests: Recent Labs  Lab 06/09/21 1035 06/10/21 0402  AST 43* 42*  ALT 22 23  ALKPHOS 127* 125  BILITOT 9.5* 7.2*  PROT 6.8 6.2*  ALBUMIN 3.5 2.6*   No results for input(s): LIPASE,  AMYLASE in the last 168 hours. No results for input(s): AMMONIA in the last 168 hours. Coagulation profile Recent Labs  Lab 06/09/21 1305 06/10/21 0402  INR 1.4* 1.6*    CBC: Recent Labs  Lab 06/09/21 1035 06/10/21 0402  WBC 9.8 10.2  HGB 12.3 11.1*  HCT 36.0 32.1*  MCV 101.1* 100.0  PLT 151 148*   Cardiac Enzymes: Recent Labs  Lab 06/10/21 0402  CKTOTAL 34*   BNP: Invalid input(s): POCBNP CBG: No results for input(s): GLUCAP in the last 168 hours. D-Dimer No results for input(s): DDIMER in the last 72 hours. Hgb A1c No results for input(s): HGBA1C in the last 72 hours. Lipid Profile No results for input(s): CHOL, HDL, LDLCALC, TRIG, CHOLHDL, LDLDIRECT in the last 72 hours. Thyroid function studies No results for input(s): TSH, T4TOTAL, T3FREE, THYROIDAB in the last  72 hours.  Invalid input(s): FREET3 Anemia work up No results for input(s): VITAMINB12, FOLATE, FERRITIN, TIBC, IRON, RETICCTPCT in the last 72 hours. Microbiology Recent Results (from the past 240 hour(s))  Resp Panel by RT-PCR (Flu A&B, Covid) Nasopharyngeal Swab     Status: None   Collection Time: 06/09/21  1:14 PM   Specimen: Nasopharyngeal Swab; Nasopharyngeal(NP) swabs in vial transport medium  Result Value Ref Range Status   SARS Coronavirus 2 by RT PCR NEGATIVE NEGATIVE Final    Comment: (NOTE) SARS-CoV-2 target nucleic acids are NOT DETECTED.  The SARS-CoV-2 RNA is generally detectable in upper respiratory specimens during the acute phase of infection. The lowest concentration of SARS-CoV-2 viral copies this assay can detect is 138 copies/mL. A negative result does not preclude SARS-Cov-2 infection and should not be used as the sole basis for treatment or other patient management decisions. A negative result may occur with  improper specimen collection/handling, submission of specimen other than nasopharyngeal swab, presence of viral mutation(s) within the areas targeted by this assay,  and inadequate number of viral copies(<138 copies/mL). A negative result must be combined with clinical observations, patient history, and epidemiological information. The expected result is Negative.  Fact Sheet for Patients:  EntrepreneurPulse.com.au  Fact Sheet for Healthcare Providers:  IncredibleEmployment.be  This test is no t yet approved or cleared by the [place] FDA and  has been authorized for detection and/or diagnosis of SARS-CoV-2 by FDA under an Emergency Use Authorization (EUA). This EUA will remain  in effect (meaning this test can be used) for the duration of the COVID-19 declaration under Section 564(b)(1) of the Act, 21 U.S.C.section 360bbb-3(b)(1), unless the authorization is terminated  or revoked sooner.       Influenza A by PCR NEGATIVE NEGATIVE Final   Influenza B by PCR NEGATIVE NEGATIVE Final    Comment: (NOTE) The Xpert Xpress SARS-CoV-2/FLU/RSV plus assay is intended as an aid in the diagnosis of influenza from Nasopharyngeal swab specimens and should not be used as a sole basis for treatment. Nasal washings and aspirates are unacceptable for Xpert Xpress SARS-CoV-2/FLU/RSV testing.  Fact Sheet for Patients: EntrepreneurPulse.com.au  Fact Sheet for Healthcare Providers: IncredibleEmployment.be  This test is not yet approved or cleared by the [place] FDA and has been authorized for detection and/or diagnosis of SARS-CoV-2 by FDA under an Emergency Use Authorization (EUA). This EUA will remain in effect (meaning this test can be used) for the duration of the COVID-19 declaration under Section 564(b)(1) of the Act, 21 U.S.C. section 360bbb-3(b)(1), unless the authorization is terminated or revoked.  Performed at [facility], [address], [place], [place] [address]   Body fluid culture w Gram Stain     Status: None (Preliminary result)    Collection Time: 06/10/21 10:42 AM   Specimen: Lung, Left; Pleural Fluid  Result Value Ref Range Status   Specimen Description LUNG  Final   Special Requests LEFT  Final   Gram Stain   Final    NO SQUAMOUS EPITHELIAL CELLS SEEN NO WBC SEEN NO ORGANISMS SEEN    Culture   Final    NO GROWTH < 24 HOURS Performed at [facility], [place] [address], [place], [place] [address]    Report Status PENDING  Incomplete     Discharge Instructions:   Discharge Instructions     Diet - low sodium heart healthy   Complete by: As directed    Increase activity slowly   Complete by:  As directed       Allergies as of 06/11/2021       Reactions   Clemastine Rash      Hydrocodone Nausea And Vomiting   Other Other (See Comments)   [employer] dayhist - d  - unknown reaction (reaction to antihistamine that is no longer on the market)   Oxycodone Nausea Only   Nausea to opioids        Medication List     STOP taking these medications    cephALEXin 500 MG capsule Commonly known as: KEFLEX       TAKE these medications    acetaminophen 160 MG chewable tablet Commonly known as: TYLENOL Chew 160 mg by mouth daily as needed for pain.   clobetasol ointment 0.05 % Commonly known as: TEMOVATE Apply 1 application topically 2 (two) times daily as needed (psoriasis).   ergocalciferol 1.25 MG (50000 UT) capsule Commonly known as: VITAMIN D2 Take 50,000 Units by mouth once a week.   furosemide 20 MG tablet Commonly known as: LASIX Take 20 mg by mouth every morning.   lidocaine 2 % solution Commonly known as: XYLOCAINE Use as directed 5 mLs in the mouth or throat every 3 (three) hours as needed for mouth pain (tooth pain).   Potassium Chloride ER 20 MEQ Tbcr Take 20 mEq by mouth every morning.   spironolactone 50 MG tablet Commonly known as: ALDACTONE Take 50 mg by mouth every morning.   triamcinolone cream 0.1 % Commonly known as: KENALOG Apply 1 application topically  daily as needed (psoriasis).        Follow-up Information     Malka So., MD Follow up in 1 week(s).   Specialty: Internal Medicine Contact information: [address] [place] [address] [phone]                  Time coordinating discharge: 35 min  Signed:  Joseph Art DO  [facility] 06/11/2021, 2:04 PM

## 2021-06-11 NOTE — Progress Notes (Addendum)
NAME:  Rebecca Bonilla, MRN:  [id number], DOB:  March 01, 1976, LOS: 0 ADMISSION DATE:  06/09/2021, CONSULTATION DATE:  06/10/2021 REFERRING MD:  Dr. 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 Patient was seen at urgent care 12/2 for complaints of left flank pain and was seen with hematuria and diagnosed with kidney stone and discharged on Keflex. When pain worsened and SOB began she presented to this ED 12/3 for further evaluation.   Of note patient was seen at this facility 10/29/20-10/30/2020 where she was treated for large left pleural effusion. IR preformed left thoracentesis and removed of bloody fluid was removed. Fluids was exudative by Lights. Cytology negative. She was seen by outpatient pulmonary with [facility] who felt effusion was secondary to hepato hydrothorax.  On admission patient was seen hemodynamically stable on RA. CT renal study obtained and revealed large left pleural effusion with extensive volume loss and mediastinal shift, cirrhosis with evidence of portal hypertension. IR was consulted and patient underwent thoracentesis with 1.8L dark bloody fluid removed. Given prior evidence of exudative effusion by lights and reocumulation of fluid effusion pulmonary was consulted.   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 Underwent thora by IR with 1.8L bloody fluid removed   Interim History / Subjective:  Afebrile  On RA    Objective   Blood pressure 126/79, pulse (!) 103, temperature 98.6 F (37 C), temperature source Oral, resp. rate 19, height 5\' 7"  (1.702 m), weight 72.7 kg, SpO2 97 %.        Intake/Output Summary (Last 24 hours) at 06/11/2021 1335 Last data filed at 06/11/2021 14/11/2020 Gross per 24 hour  Intake 480 ml  Output 600 ml  Net -120 ml   Filed Weights   06/09/21 1633 06/10/21 0551 06/11/21 0459  Weight: 75.5 kg 74.6 kg 72.7 kg    Examination: General: adult female lying in bed in NAD, jaundice noted  HEENT: MM pink/moist, wearing glasses, mild icterus Neuro: AAOx4, speech clear, MAE  CV: s1s2 RRR, no m/r/g PULM: non-labored at rest, diminished LLL, clear otherwise  GI: soft, bsx4 active  Extremities: warm/dry  Skin: no rashes or lesions  Resolved Hospital Problem list     Assessment & Plan:   Large Left pleural effusion Patient presented with left flank pain and SOB. CT kidney stone done on ED arrival and revealed large left pleural effusion.  IR preformed thoracentesis 12/4 with 1.8L of bloody fluid removed. Prior thoracentesis at this facility 4/25 with 5/25 of bloody fludi removed. Exudative by Lights criteria, cytology negative.      -follow up pleural studies > protein in process. Confirmed with lab, LDH will not be able to be run as was refrigerated  -follow up cytology -pleurX not indicated in hepatic hydrothorax -may need pleural biospy  -office follow up arranged > 12/8 at 11am with 14/8, NP -will need repeat CXR and consideration for outpatient thora > may be able to set up on Thurs after office  visit if needed in hospital (call hospital pager coverage to arrange if needed)  Best Practice (right click and "Reselect all SmartList Selections" daily)  Per primary   Critical care time: NA    Noe Gens, MSN, APRN, NP-C, AGACNP-BC [facility] 06/11/2021, 1:36 PM   Please see [email or web address] for pager details.   From 7A-7P if no response,  please call [phone] After hours, please call ELink [phone]

## 2021-06-12 LAB — CYTOLOGY - NON PAP

## 2021-06-13 LAB — BODY FLUID CULTURE W GRAM STAIN
Culture: NO GROWTH
Gram Stain: NONE SEEN

## 2021-06-14 ENCOUNTER — Other Ambulatory Visit: Payer: Self-pay | Admitting: Nurse Practitioner

## 2021-06-14 ENCOUNTER — Telehealth: Payer: Self-pay | Admitting: Nurse Practitioner

## 2021-06-14 ENCOUNTER — Ambulatory Visit (INDEPENDENT_AMBULATORY_CARE_PROVIDER_SITE_OTHER): Payer: Managed Care, Other (non HMO)

## 2021-06-14 ENCOUNTER — Ambulatory Visit: Payer: Managed Care, Other (non HMO) | Admitting: Nurse Practitioner

## 2021-06-14 ENCOUNTER — Encounter: Payer: Self-pay | Admitting: Pulmonary Disease

## 2021-06-14 ENCOUNTER — Encounter: Payer: Self-pay | Admitting: Nurse Practitioner

## 2021-06-14 ENCOUNTER — Other Ambulatory Visit: Payer: Self-pay

## 2021-06-14 VITALS — BP 132/70 | HR 98 | Temp 98.2°F | Ht 67.0 in | Wt 165.0 lb

## 2021-06-14 DIAGNOSIS — J90 Pleural effusion, not elsewhere classified: Secondary | ICD-10-CM

## 2021-06-14 DIAGNOSIS — F419 Anxiety disorder, unspecified: Secondary | ICD-10-CM

## 2021-06-14 DIAGNOSIS — Z09 Encounter for follow-up examination after completed treatment for conditions other than malignant neoplasm: Secondary | ICD-10-CM | POA: Diagnosis not present

## 2021-06-14 DIAGNOSIS — K7031 Alcoholic cirrhosis of liver with ascites: Secondary | ICD-10-CM

## 2021-06-14 DIAGNOSIS — R0602 Shortness of breath: Secondary | ICD-10-CM

## 2021-06-14 MED ORDER — ALPRAZOLAM 0.5 MG PO TABS: ORAL_TABLET | ORAL | 0 refills | Status: DC

## 2021-06-14 NOTE — Assessment & Plan Note (Addendum)
Symptoms stable with improved SOB compared with ED visit. Repeat CXR today showed persistent, unchanged large left pleural effusion with lower lobe collapse. No new acute findings. Will scheduled outpatient thoracentesis tomorrow. Previously experienced severe anxiety during procedure and required xanax treatment. Rx change to lorazepam 0.25 mg 30 minutes prior to procedure; if no relief can repeat with another 0.25 mg. Needs to have ride to and from procedure. CT chest with and without contrast ordered after thoracentesis for further evaluation of possible underlying etiologies. Follow up with Dr. Isaiah Serge next week.  Patient Instructions  Repeat thoracentesis. Scheduling will contact you.   Chest x ray today showed unchanged left sided pleural effusion.   If you develop worsening shortness of breath, increased chest pain, difficulties breathing, seek emergency care.   Follow up in office with Dr. Isaiah Serge after thoracentesis. If symptoms do not improve or worsen, please contact office for sooner follow up or seek emergency care.

## 2021-06-14 NOTE — Assessment & Plan Note (Addendum)
Small amount noted on CT renal - no need for paracentesis noted. Hospital note questioned if recurrent pleural effusion is hepato hydrothorax due to bloody appearance of fluid. Need further workup and eval. INR 1.6. See above plan.

## 2021-06-14 NOTE — Patient Instructions (Addendum)
Repeat thoracentesis. Scheduling will contact you.   Chest x ray today showed unchanged left sided pleural effusion.   If you develop worsening shortness of breath, increased chest pain, difficulties breathing, seek emergency care.   Follow up in office with Dr. Isaiah Serge after thoracentesis. If symptoms do not improve or worsen, please contact office for sooner follow up or seek emergency care.

## 2021-06-14 NOTE — H&P (View-Only) (Signed)
@Patient  ID: Rebecca Bonilla, female    DOB: 10-21-1975, 45 y.o.   MRN: [id number]  Chief Complaint  Patient presents with   Hospitalization Follow-up    Still SOB     Referring provider: Lilian Coma., MD  HPI: 45 year old female, former smoker (5 pack years) followed for recurrent left-sided pleural effusion. She is new to the pulmonary clinic and seen in the hospital most recently by Dr. Vaughan Browner on 06/11/2021. She was admitted from 12/3-12/5 and had a thoracentesis on 12/4. 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  TEST/EVENTS:  10/29/2020 CTA chest: Normal heart size.  No PE noted.  No lymphadenopathy.  Large left pleural effusion with associated atelectasis.  Right lung is clear with no pleural effusion.  Liver with nodular surface contour.  Moderate volume ascites. 10/30/2020 ultrasound abdomen: Trace abdominopelvic ascites by ultrasound.  Not enough to warrant therapeutic paracentesis. 10/30/2020: Thoracentesis with IR. 670 mL bloody fluid - exudative by Lights, cytology negative. 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  Left base atelectasis.  Right lung appears clear. 06/09/2021 CT renal stone: Large left pleural effusion with collapse of the left lower lobe.  Volume loss in the left upper lobe, entire chest not visualized.  Some mediastinal shift towards the right due to the large left pleural effusion.  Cirrhosis.  Small amount of perihepatic ascites.  Gallstones without gallbladder distention.  Negative for kidney stones or hydronephrosis.  Diffusely enlarged venous structures throughout the abdomen particularly in the left upper quadrant around the left kidney and spleen.  Prominent umbilical vein.  Compatible with portal hypertension 06/10/2021 CXR 2 view: Large left pleural effusion.  Right lung clear. 06/11/2021 CXR 1 view: Large left pleural  effusion, stable to slightly decreased post thoracentesis  06/09/2021-06/11/2021: Hospitalization for large left pleural effusion. Presented to ED with left flank pain. Previously evaluated at urgent care and diagnosed with kidney stone, placed on Keflex. She also reported SOB. ED workup did not reveal a stone; however, did reveal a large left sided pleural effusion. No ascites noted on CT renal. PCCM consulted. Thoracentesis 12/4 with 1.8L of bloody fluid removed. Exudative by Lights criteria, cytology negative. LDH was unable to be run as it was refrigerated. 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 Vaughan Browner noted unusual to have bloody appearance with this etiology. Follow up outpatient with repeat CXR and possible repeat thoracentesis.   06/14/2021: Today - hospital follow up Patient presents today for hospital follow up after admission and thoracentesis for large left sided pleural effusion. She reports her shortness of breath is significantly improved. She continues to have a predominately non-productive cough, but at times, is coughing up white, small amount of sputum. She denies any orthopnea, PND, chest pain/discomfort, or wheezing. She is not experiencing any pain to her left side. She denies any fevers/chills, bleeding or drainage from the site. Overall, she feels better but is still experiencing mild shortness of breath upon exertion.   Allergies  Allergen Reactions   Clemastine Rash         Hydrocodone Nausea And Vomiting   Other Other (See Comments)    [employer] dayhist - d  - unknown reaction (reaction to antihistamine that is no longer on the market)   Oxycodone Nausea Only    Nausea to opioids      Immunization History  Administered Date(s) Administered   Hepatitis B, adult 02/23/2021,  03/26/2021   Influenza-Unspecified 03/21/2021   Moderna Sars-Covid-2 Vaccination 02/17/2020   Tdap 07/08/2001    Past Medical History:  Diagnosis Date   Alcohol  abuse    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   Hyponatremia 08/15/2020   Macrocytosis 10/29/2020   Portal hypertension (HCC) 08/14/2020    Tobacco History: Social History   Tobacco Use  Smoking Status Former   Packs/day: 0.50   Years: 10.00   Pack years: 5.00   Types: Cigarettes  Smokeless Tobacco Never   Counseling given: Not Answered   Outpatient Medications Prior to Visit  Medication Sig Dispense Refill   acetaminophen (TYLENOL) 160 MG chewable tablet Chew 160 mg by mouth daily as needed for pain.     clobetasol ointment (TEMOVATE) 0.05 % Apply 1 application topically 2 (two) times daily as needed (psoriasis).     furosemide (LASIX) 20 MG tablet Take 20 mg by mouth every morning.     lidocaine (XYLOCAINE) 2 % solution Use as directed 5 mLs in the mouth or throat every 3 (three) hours as needed for mouth pain (tooth pain).     Potassium Chloride ER 20 MEQ TBCR Take 20 mEq by mouth every morning.     spironolactone (ALDACTONE) 50 MG tablet Take 50 mg by mouth every morning.     triamcinolone cream (KENALOG) 0.1 % Apply 1 application topically daily as needed (psoriasis).     ergocalciferol (VITAMIN D2) 1.25 MG (50000 UT) capsule Take 50,000 Units by mouth once a week. (Patient not taking: Reported on 06/14/2021)     No facility-administered medications prior to visit.     Review of Systems:   Constitutional: No weight loss or gain, night sweats, fevers, chills, fatigue, or lassitude. HEENT: No headaches, difficulty swallowing, tooth/dental problems, or sore throat. No sneezing, itching, ear ache, nasal congestion. +intermittent clear rhinorrhea CV:  No chest pain, orthopnea, PND, swelling in lower extremities, anasarca, dizziness, palpitations, syncope Resp: +shortness of breath with exertion (significantly improved); predominately non-productive cough, rarely productive with small amount of white sputum. No hemoptysis. No wheezing.  No chest wall deformity GI:  No  heartburn, indigestion, abdominal pain, nausea, vomiting, diarrhea, change in bowel habits, loss of appetite, bloody stools.  GU: No dysuria, change in color of urine, urgency or frequency.  No flank pain, no hematuria  Skin: No rash, lesions, ulcerations MSK:  No joint pain or swelling.  No decreased range of motion.  No back pain. Neuro: No dizziness or lightheadedness.  Psych: No depression or anxiety. Mood stable.     Physical Exam:  BP 132/70 (BP Location: Right Arm, Patient Position: Sitting, Cuff Size: Normal)   Pulse 98   Temp 98.2 F (36.8 C) (Oral)   Ht 5\' 7"  (1.702 m)   Wt 165 lb (74.8 kg)   SpO2 99%   BMI 25.84 kg/m   GEN: Pleasant, interactive, well-nourished; in no acute distress. HEENT:  Normocephalic and atraumatic. EACs patent bilaterally. TM pearly gray with present light reflex bilaterally. PERRLA. Sclera white. Nasal turbinates pink, moist and patent bilaterally. No rhinorrhea present. Oropharynx pink and moist, without exudate or edema. No lesions, ulcerations, or postnasal drip.  NECK:  Supple w/ fair ROM. No JVD present. Normal carotid impulses w/o bruits. Thyroid symmetrical with no goiter or nodules palpated. No lymphadenopathy.   CV: RRR, no m/r/g, no peripheral edema. Pulses intact, +2 bilaterally. No cyanosis, pallor or clubbing. PULMONARY:  Unlabored, regular breathing. Severely diminished left posterior breath sounds, diminished left anterior,  clear on the right A&P w/o wheezes/rales/rhonchi. No accessory muscle use. No dullness to percussion. GI: BS present and normoactive. Soft, non-tender to palpation. No organomegaly or masses detected. No CVA tenderness. MSK: No erythema, warmth or tenderness. Cap refil <2 sec all extrem. No deformities or joint swelling noted.  Neuro: A/Ox3. No focal deficits noted.   Skin: Warm, no lesions or rashes. Well-healing, approximated puncture site left lateral chest Psych: Normal affect and behavior. Judgement and thought  content appropriate.     Lab Results:  CBC    Component Value Date/Time   WBC 10.2 06/10/2021 0402   RBC 3.21 (L) 06/10/2021 0402   HGB 11.1 (L) 06/10/2021 0402   HCT 32.1 (L) 06/10/2021 0402   PLT 148 (L) 06/10/2021 0402   MCV 100.0 06/10/2021 0402   MCH 34.6 (H) 06/10/2021 0402   MCHC 34.6 06/10/2021 0402   RDW 12.3 06/10/2021 0402    BMET    Component Value Date/Time   NA 131 (L) 06/11/2021 0258   K 4.7 06/11/2021 0258   CL 98 06/11/2021 0258   CO2 26 06/11/2021 0258   GLUCOSE 98 06/11/2021 0258   BUN 8 06/11/2021 0258   CREATININE 0.87 06/11/2021 0258   CALCIUM 8.9 06/11/2021 0258   GFRNONAA >60 06/11/2021 0258    BNP No results found for: BNP   Imaging:  06/14/2021: CXR today reviewed by me. Relatively unchanged large left pleural effusion with complete collapse of left lower lobe and partial collapse of the left upper lobe.   DG Chest 1 View  Result Date: 06/10/2021 CLINICAL DATA:  Status post left thoracentesis EXAM: CHEST  1 VIEW COMPARISON:  06/10/2021 FINDINGS: Minimal decrease in volume of a large left pleural effusion with slight improvement in aeration of the left upper lobe. The right lung is normally aerated. The cardiopericardial silhouette is predominantly obscured. IMPRESSION: Minimal decrease in volume of a large left pleural effusion with slight improvement in aeration of the left upper lobe. No pneumothorax. Electronically Signed   By: Delanna Ahmadi M.D.   On: 06/10/2021 11:30   DG Chest 2 View  Result Date: 06/14/2021 CLINICAL DATA:  Left pleural effusion. EXAM: CHEST - 2 VIEW COMPARISON:  Chest x-ray dated June 11, 2021. FINDINGS: Normal heart size. Unchanged large left pleural effusion with complete collapse of the left lower lobe and partial collapse of the left upper lobe. The right lung is clear. No pneumothorax. No acute osseous abnormality. IMPRESSION: 1. Unchanged large left pleural effusion. Electronically Signed   By: Titus Dubin  M.D.   On: 06/14/2021 11:20   DG Chest 2 View  Result Date: 06/10/2021 CLINICAL DATA:  Shortness of breath. EXAM: CHEST - 2 VIEW COMPARISON:  06/09/2021 FINDINGS: Large left pleural effusion again noted. Right lung clear. Stable heart size. The visualized bony structures of the thorax show no acute abnormality. Telemetry leads overlie the chest. IMPRESSION: Large left pleural effusion. Electronically Signed   By: Misty Stanley M.D.   On: 06/10/2021 08:33   DG CHEST PORT 1 VIEW  Result Date: 06/11/2021 CLINICAL DATA:  Pleural effusion EXAM: PORTABLE CHEST 1 VIEW COMPARISON:  Chest radiograph 06/10/2021 FINDINGS: Cardiomediastinal silhouette is not well assessed. There is a large left pleural effusion, stable to slightly decreased in size since 06/10/2021. Aeration of the left upper lobe has slightly improved. There is no pneumothorax. The right lung is clear. There is no right pleural effusion or pneumothorax. There is no acute osseous abnormality. IMPRESSION: Stable to slight interval  decrease in size of the large left pleural effusion with slightly improved aeration of the left upper lobe. No pneumothorax. Electronically Signed   By: Peter  Noone M.D.   On: 06/11/2021 08:10   DG Chest Port 1 View  Result Date: 06/09/2021 CLINICAL DATA:  Shortness of breath. EXAM: PORTABLE CHEST 1 VIEW COMPARISON:  01/06/2021 FINDINGS: There is a massive left pleural effusion with extensive volume loss throughout the left lung. Only a small portion of the left lung is aerated in the left upper chest. Right lung is clear. There is some mediastinal shift towards the right due to large left pleural effusion. IMPRESSION: Very large left pleural effusion with extensive volume loss in the left lung. Electronically Signed   By: Adam  Henn M.D.   On: 06/09/2021 12:26   CT Renal Stone Study  Result Date: 06/09/2021 CLINICAL DATA:  Hematuria, unknown cause.  Left flank pain. EXAM: CT ABDOMEN AND PELVIS WITHOUT CONTRAST  TECHNIQUE: Multidetector CT imaging of the abdomen and pelvis was performed following the standard protocol without IV contrast. COMPARISON:  10/29/2020 FINDINGS: Lower chest: There is a very large left pleural effusion with collapse of the left lower lobe. There is also volume loss in the left upper lobe but the entire chest is not imaged. Negative for right pleural effusion. There is some mediastinal shift towards the right due to the large left pleural effusion. Hepatobiliary: Again noted is nodular contour and compatible with cirrhosis. Small amount of perihepatic ascites. Gallstones without gallbladder distension. Pancreas: Unremarkable. No pancreatic ductal dilatation or surrounding inflammatory changes. Spleen: Spleen is prominent for size and similar to the previous examination. Adrenals/Urinary Tract: Normal appearance of the adrenal glands. Negative for kidney stones or hydronephrosis. Fluid in the urinary bladder. Stomach/Bowel: Normal appearance of the stomach. No evidence for bowel distention or acute bowel inflammation. Vascular/Lymphatic: Again noted are diffusely enlarged venous structures throughout the abdomen particularly in the left upper quadrant around the left kidney and spleen. Patient also has a prominent umbilical vein. These findings are compatible with portal hypertension. Reproductive: Uterus and bilateral adnexa are unremarkable. Other: Small amount of ascites in the pelvis and a small amount of ascites in the upper quadrants. Diffuse stranding throughout the abdominal mesentery similar to the previous examination and probably related to venous congestion from the portal hypertension. Musculoskeletal: No acute bone abnormality. IMPRESSION: 1. Large left pleural effusion with extensive volume loss in the left lung and mediastinal shift towards the right. Left pleural effusion etiology could be associated with a hepatic hydrothorax. 2. Cirrhosis with evidence of portal hypertension  demonstrated by splenomegaly and enlarged venous structures throughout the abdomen and small amount of ascites. 3. Negative for kidney stones or hydronephrosis. 4. Cholelithiasis. Electronically Signed   By: Adam  Henn M.D.   On: 06/09/2021 12:24   US THORACENTESIS ASP PLEURAL SPACE W/IMG GUIDE  Result Date: 06/10/2021 INDICATION: History of cirrhosis, history of hepatic hydrothorax, recurrent pleural effusion EXAM: ULTRASOUND GUIDED LEFT THORACENTESIS MEDICATIONS: None. COMPLICATIONS: None immediate. PROCEDURE: An ultrasound guided thoracentesis was thoroughly discussed with the patient and questions answered. The benefits, risks, alternatives and complications were also discussed. The patient understands and wishes to proceed with the procedure. Written consent was obtained. Ultrasound was performed to localize and mark an adequate pocket of fluid in the left chest. The area was then prepped and draped in the normal sterile fashion. 1% Lidocaine was used for local anesthesia. Under ultrasound guidance a 6 Fr Safe-T-Centesis catheter was introduced. Thoracentesis was performed. The   catheter was removed and a dressing applied. FINDINGS: A total of approximately 1.8 of dark bloody pleural fluid was removed. Samples were sent to the laboratory as requested by the clinical team. IMPRESSION: Successful ultrasound guided left thoracentesis yielding 1.8L of pleural fluid. Electronically Signed   By: Jacqulynn Cadet M.D.   On: 06/10/2021 11:18      No flowsheet data found.  No results found for: NITRICOXIDE      Assessment & Plan:   Pleural effusion on left Symptoms stable with improved SOB compared with ED visit. Repeat CXR today showed persistent, unchanged large left pleural effusion with lower lobe collapse. No new acute findings. Will scheduled outpatient thoracentesis tomorrow. Previously experienced severe anxiety during procedure and required xanax treatment. Rx change to lorazepam 0.25 mg 30  minutes prior to procedure; if no relief can repeat with another 0.25 mg. Needs to have ride to and from procedure. CT chest with and without contrast ordered after thoracentesis for further evaluation of possible underlying etiologies. Follow up with Dr. Vaughan Browner next week.  Patient Instructions  Repeat thoracentesis. Scheduling will contact you.   Chest x ray today showed unchanged left sided pleural effusion.   If you develop worsening shortness of breath, increased chest pain, difficulties breathing, seek emergency care.   Follow up in office with Dr. Vaughan Browner after thoracentesis. If symptoms do not improve or worsen, please contact office for sooner follow up or seek emergency care.    Cirrhosis of liver with ascites (HCC) Small amount noted on CT renal - no need for paracentesis noted. Hospital note questioned if recurrent pleural effusion is hepato hydrothorax due to bloody appearance of fluid. Need further workup and eval. INR 1.6. See above plan.  Hospital discharge follow-up Improved symptoms. Pt stable. Left pleural effusion relatively unchanged. See above plan.     Clayton Bibles, NP 06/15/2021  Pt aware and understands NP's role.

## 2021-06-14 NOTE — Progress Notes (Addendum)
@Patient  ID: Rebecca Bonilla, female    DOB: 25-Oct-1975, 45 y.o.   MRN: [id number]  Chief Complaint  Patient presents with   Hospitalization Follow-up    Still SOB     Referring provider: Lilian Coma., MD  HPI: 45 year old female, former smoker (5 pack years) followed for recurrent left-sided pleural effusion. She is new to the pulmonary clinic and seen in the hospital most recently by Dr. Vaughan Browner on 06/11/2021. She was admitted from 12/3-12/5 and had a thoracentesis on 12/4. 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  TEST/EVENTS:  10/29/2020 CTA chest: Normal heart size.  No PE noted.  No lymphadenopathy.  Large left pleural effusion with associated atelectasis.  Right lung is clear with no pleural effusion.  Liver with nodular surface contour.  Moderate volume ascites. 10/30/2020 ultrasound abdomen: Trace abdominopelvic ascites by ultrasound.  Not enough to warrant therapeutic paracentesis. 10/30/2020: Thoracentesis with IR. 670 mL bloody fluid - exudative by Lights, cytology negative. 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  Left base atelectasis.  Right lung appears clear. 06/09/2021 CT renal stone: Large left pleural effusion with collapse of the left lower lobe.  Volume loss in the left upper lobe, entire chest not visualized.  Some mediastinal shift towards the right due to the large left pleural effusion.  Cirrhosis.  Small amount of perihepatic ascites.  Gallstones without gallbladder distention.  Negative for kidney stones or hydronephrosis.  Diffusely enlarged venous structures throughout the abdomen particularly in the left upper quadrant around the left kidney and spleen.  Prominent umbilical vein.  Compatible with portal hypertension 06/10/2021 CXR 2 view: Large left pleural effusion.  Right lung clear. 06/11/2021 CXR 1 view: Large left pleural  effusion, stable to slightly decreased post thoracentesis  06/09/2021-06/11/2021: Hospitalization for large left pleural effusion. Presented to ED with left flank pain. Previously evaluated at urgent care and diagnosed with kidney stone, placed on Keflex. She also reported SOB. ED workup did not reveal a stone; however, did reveal a large left sided pleural effusion. No ascites noted on CT renal. PCCM consulted. Thoracentesis 12/4 with 1.8L of bloody fluid removed. Exudative by Lights criteria, cytology negative. LDH was unable to be run as it was refrigerated. 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 Vaughan Browner noted unusual to have bloody appearance with this etiology. Follow up outpatient with repeat CXR and possible repeat thoracentesis.   06/14/2021: Today - hospital follow up Patient presents today for hospital follow up after admission and thoracentesis for large left sided pleural effusion. She reports her shortness of breath is significantly improved. She continues to have a predominately non-productive cough, but at times, is coughing up white, small amount of sputum. She denies any orthopnea, PND, chest pain/discomfort, or wheezing. She is not experiencing any pain to her left side. She denies any fevers/chills, bleeding or drainage from the site. Overall, she feels better but is still experiencing mild shortness of breath upon exertion.   Allergies  Allergen Reactions   Clemastine Rash         Hydrocodone Nausea And Vomiting   Other Other (See Comments)    [employer] dayhist - d  - unknown reaction (reaction to antihistamine that is no longer on the market)   Oxycodone Nausea Only    Nausea to opioids      Immunization History  Administered Date(s) Administered   Hepatitis B, adult 02/23/2021,  03/26/2021   Influenza-Unspecified 03/21/2021   Moderna Sars-Covid-2 Vaccination 02/17/2020   Tdap 07/08/2001    Past Medical History:  Diagnosis Date   Alcohol  abuse    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   Hyponatremia 08/15/2020   Macrocytosis 10/29/2020   Portal hypertension (HCC) 08/14/2020    Tobacco History: Social History   Tobacco Use  Smoking Status Former   Packs/day: 0.50   Years: 10.00   Pack years: 5.00   Types: Cigarettes  Smokeless Tobacco Never   Counseling given: Not Answered   Outpatient Medications Prior to Visit  Medication Sig Dispense Refill   acetaminophen (TYLENOL) 160 MG chewable tablet Chew 160 mg by mouth daily as needed for pain.     clobetasol ointment (TEMOVATE) 0.05 % Apply 1 application topically 2 (two) times daily as needed (psoriasis).     furosemide (LASIX) 20 MG tablet Take 20 mg by mouth every morning.     lidocaine (XYLOCAINE) 2 % solution Use as directed 5 mLs in the mouth or throat every 3 (three) hours as needed for mouth pain (tooth pain).     Potassium Chloride ER 20 MEQ TBCR Take 20 mEq by mouth every morning.     spironolactone (ALDACTONE) 50 MG tablet Take 50 mg by mouth every morning.     triamcinolone cream (KENALOG) 0.1 % Apply 1 application topically daily as needed (psoriasis).     ergocalciferol (VITAMIN D2) 1.25 MG (50000 UT) capsule Take 50,000 Units by mouth once a week. (Patient not taking: Reported on 06/14/2021)     No facility-administered medications prior to visit.     Review of Systems:   Constitutional: No weight loss or gain, night sweats, fevers, chills, fatigue, or lassitude. HEENT: No headaches, difficulty swallowing, tooth/dental problems, or sore throat. No sneezing, itching, ear ache, nasal congestion. +intermittent clear rhinorrhea CV:  No chest pain, orthopnea, PND, swelling in lower extremities, anasarca, dizziness, palpitations, syncope Resp: +shortness of breath with exertion (significantly improved); predominately non-productive cough, rarely productive with small amount of white sputum. No hemoptysis. No wheezing.  No chest wall deformity GI:  No  heartburn, indigestion, abdominal pain, nausea, vomiting, diarrhea, change in bowel habits, loss of appetite, bloody stools.  GU: No dysuria, change in color of urine, urgency or frequency.  No flank pain, no hematuria  Skin: No rash, lesions, ulcerations MSK:  No joint pain or swelling.  No decreased range of motion.  No back pain. Neuro: No dizziness or lightheadedness.  Psych: No depression or anxiety. Mood stable.     Physical Exam:  BP 132/70 (BP Location: Right Arm, Patient Position: Sitting, Cuff Size: Normal)   Pulse 98   Temp 98.2 F (36.8 C) (Oral)   Ht 5\' 7"  (1.702 m)   Wt 165 lb (74.8 kg)   SpO2 99%   BMI 25.84 kg/m   GEN: Pleasant, interactive, well-nourished; in no acute distress. HEENT:  Normocephalic and atraumatic. EACs patent bilaterally. TM pearly gray with present light reflex bilaterally. PERRLA. Sclera white. Nasal turbinates pink, moist and patent bilaterally. No rhinorrhea present. Oropharynx pink and moist, without exudate or edema. No lesions, ulcerations, or postnasal drip.  NECK:  Supple w/ fair ROM. No JVD present. Normal carotid impulses w/o bruits. Thyroid symmetrical with no goiter or nodules palpated. No lymphadenopathy.   CV: RRR, no m/r/g, no peripheral edema. Pulses intact, +2 bilaterally. No cyanosis, pallor or clubbing. PULMONARY:  Unlabored, regular breathing. Severely diminished left posterior breath sounds, diminished left anterior,  clear on the right A&P w/o wheezes/rales/rhonchi. No accessory muscle use. No dullness to percussion. GI: BS present and normoactive. Soft, non-tender to palpation. No organomegaly or masses detected. No CVA tenderness. MSK: No erythema, warmth or tenderness. Cap refil <2 sec all extrem. No deformities or joint swelling noted.  Neuro: A/Ox3. No focal deficits noted.   Skin: Warm, no lesions or rashes. Well-healing, approximated puncture site left lateral chest Psych: Normal affect and behavior. Judgement and thought  content appropriate.     Lab Results:  CBC    Component Value Date/Time   WBC 10.2 06/10/2021 0402   RBC 3.21 (L) 06/10/2021 0402   HGB 11.1 (L) 06/10/2021 0402   HCT 32.1 (L) 06/10/2021 0402   PLT 148 (L) 06/10/2021 0402   MCV 100.0 06/10/2021 0402   MCH 34.6 (H) 06/10/2021 0402   MCHC 34.6 06/10/2021 0402   RDW 12.3 06/10/2021 0402    BMET    Component Value Date/Time   NA 131 (L) 06/11/2021 0258   K 4.7 06/11/2021 0258   CL 98 06/11/2021 0258   CO2 26 06/11/2021 0258   GLUCOSE 98 06/11/2021 0258   BUN 8 06/11/2021 0258   CREATININE 0.87 06/11/2021 0258   CALCIUM 8.9 06/11/2021 0258   GFRNONAA >60 06/11/2021 0258    BNP No results found for: BNP   Imaging:  06/14/2021: CXR today reviewed by me. Relatively unchanged large left pleural effusion with complete collapse of left lower lobe and partial collapse of the left upper lobe.   DG Chest 1 View  Result Date: 06/10/2021 CLINICAL DATA:  Status post left thoracentesis EXAM: CHEST  1 VIEW COMPARISON:  06/10/2021 FINDINGS: Minimal decrease in volume of a large left pleural effusion with slight improvement in aeration of the left upper lobe. The right lung is normally aerated. The cardiopericardial silhouette is predominantly obscured. IMPRESSION: Minimal decrease in volume of a large left pleural effusion with slight improvement in aeration of the left upper lobe. No pneumothorax. Electronically Signed   By: Delanna Ahmadi M.D.   On: 06/10/2021 11:30   DG Chest 2 View  Result Date: 06/14/2021 CLINICAL DATA:  Left pleural effusion. EXAM: CHEST - 2 VIEW COMPARISON:  Chest x-ray dated June 11, 2021. FINDINGS: Normal heart size. Unchanged large left pleural effusion with complete collapse of the left lower lobe and partial collapse of the left upper lobe. The right lung is clear. No pneumothorax. No acute osseous abnormality. IMPRESSION: 1. Unchanged large left pleural effusion. Electronically Signed   By: Titus Dubin  M.D.   On: 06/14/2021 11:20   DG Chest 2 View  Result Date: 06/10/2021 CLINICAL DATA:  Shortness of breath. EXAM: CHEST - 2 VIEW COMPARISON:  06/09/2021 FINDINGS: Large left pleural effusion again noted. Right lung clear. Stable heart size. The visualized bony structures of the thorax show no acute abnormality. Telemetry leads overlie the chest. IMPRESSION: Large left pleural effusion. Electronically Signed   By: Misty Stanley M.D.   On: 06/10/2021 08:33   DG CHEST PORT 1 VIEW  Result Date: 06/11/2021 CLINICAL DATA:  Pleural effusion EXAM: PORTABLE CHEST 1 VIEW COMPARISON:  Chest radiograph 06/10/2021 FINDINGS: Cardiomediastinal silhouette is not well assessed. There is a large left pleural effusion, stable to slightly decreased in size since 06/10/2021. Aeration of the left upper lobe has slightly improved. There is no pneumothorax. The right lung is clear. There is no right pleural effusion or pneumothorax. There is no acute osseous abnormality. IMPRESSION: Stable to slight interval  decrease in size of the large left pleural effusion with slightly improved aeration of the left upper lobe. No pneumothorax. Electronically Signed   By: Valetta Mole M.D.   On: 06/11/2021 08:10   DG Chest Port 1 View  Result Date: 06/09/2021 CLINICAL DATA:  Shortness of breath. EXAM: PORTABLE CHEST 1 VIEW COMPARISON:  01/06/2021 FINDINGS: There is a massive left pleural effusion with extensive volume loss throughout the left lung. Only a small portion of the left lung is aerated in the left upper chest. Right lung is clear. There is some mediastinal shift towards the right due to large left pleural effusion. IMPRESSION: Very large left pleural effusion with extensive volume loss in the left lung. Electronically Signed   By: Markus Daft M.D.   On: 06/09/2021 12:26   CT Renal Stone Study  Result Date: 06/09/2021 CLINICAL DATA:  Hematuria, unknown cause.  Left flank pain. EXAM: CT ABDOMEN AND PELVIS WITHOUT CONTRAST  TECHNIQUE: Multidetector CT imaging of the abdomen and pelvis was performed following the standard protocol without IV contrast. COMPARISON:  10/29/2020 FINDINGS: Lower chest: There is a very large left pleural effusion with collapse of the left lower lobe. There is also volume loss in the left upper lobe but the entire chest is not imaged. Negative for right pleural effusion. There is some mediastinal shift towards the right due to the large left pleural effusion. Hepatobiliary: Again noted is nodular contour and compatible with cirrhosis. Small amount of perihepatic ascites. Gallstones without gallbladder distension. Pancreas: Unremarkable. No pancreatic ductal dilatation or surrounding inflammatory changes. Spleen: Spleen is prominent for size and similar to the previous examination. Adrenals/Urinary Tract: Normal appearance of the adrenal glands. Negative for kidney stones or hydronephrosis. Fluid in the urinary bladder. Stomach/Bowel: Normal appearance of the stomach. No evidence for bowel distention or acute bowel inflammation. Vascular/Lymphatic: Again noted are diffusely enlarged venous structures throughout the abdomen particularly in the left upper quadrant around the left kidney and spleen. Patient also has a prominent umbilical vein. These findings are compatible with portal hypertension. Reproductive: Uterus and bilateral adnexa are unremarkable. Other: Small amount of ascites in the pelvis and a small amount of ascites in the upper quadrants. Diffuse stranding throughout the abdominal mesentery similar to the previous examination and probably related to venous congestion from the portal hypertension. Musculoskeletal: No acute bone abnormality. IMPRESSION: 1. Large left pleural effusion with extensive volume loss in the left lung and mediastinal shift towards the right. Left pleural effusion etiology could be associated with a hepatic hydrothorax. 2. Cirrhosis with evidence of portal hypertension  demonstrated by splenomegaly and enlarged venous structures throughout the abdomen and small amount of ascites. 3. Negative for kidney stones or hydronephrosis. 4. Cholelithiasis. Electronically Signed   By: Markus Daft M.D.   On: 06/09/2021 12:24   US THORACENTESIS ASP PLEURAL SPACE W/IMG GUIDE  Result Date: 06/10/2021 INDICATION: History of cirrhosis, history of hepatic hydrothorax, recurrent pleural effusion EXAM: ULTRASOUND GUIDED LEFT THORACENTESIS MEDICATIONS: None. COMPLICATIONS: None immediate. PROCEDURE: An ultrasound guided thoracentesis was thoroughly discussed with the patient and questions answered. The benefits, risks, alternatives and complications were also discussed. The patient understands and wishes to proceed with the procedure. Written consent was obtained. Ultrasound was performed to localize and mark an adequate pocket of fluid in the left chest. The area was then prepped and draped in the normal sterile fashion. 1% Lidocaine was used for local anesthesia. Under ultrasound guidance a 6 Fr Safe-T-Centesis catheter was introduced. Thoracentesis was performed. The  catheter was removed and a dressing applied. FINDINGS: A total of approximately 1.8 of dark bloody pleural fluid was removed. Samples were sent to the laboratory as requested by the clinical team. IMPRESSION: Successful ultrasound guided left thoracentesis yielding 1.8L of pleural fluid. Electronically Signed   By: Jacqulynn Cadet M.D.   On: 06/10/2021 11:18      No flowsheet data found.  No results found for: NITRICOXIDE      Assessment & Plan:   Pleural effusion on left Symptoms stable with improved SOB compared with ED visit. Repeat CXR today showed persistent, unchanged large left pleural effusion with lower lobe collapse. No new acute findings. Will scheduled outpatient thoracentesis tomorrow. Previously experienced severe anxiety during procedure and required xanax treatment. Rx change to lorazepam 0.25 mg 30  minutes prior to procedure; if no relief can repeat with another 0.25 mg. Needs to have ride to and from procedure. CT chest with and without contrast ordered after thoracentesis for further evaluation of possible underlying etiologies. Follow up with Dr. Vaughan Browner next week.  Patient Instructions  Repeat thoracentesis. Scheduling will contact you.   Chest x ray today showed unchanged left sided pleural effusion.   If you develop worsening shortness of breath, increased chest pain, difficulties breathing, seek emergency care.   Follow up in office with Dr. Vaughan Browner after thoracentesis. If symptoms do not improve or worsen, please contact office for sooner follow up or seek emergency care.    Cirrhosis of liver with ascites (HCC) Small amount noted on CT renal - no need for paracentesis noted. Hospital note questioned if recurrent pleural effusion is hepato hydrothorax due to bloody appearance of fluid. Need further workup and eval. INR 1.6. See above plan.  Hospital discharge follow-up Improved symptoms. Pt stable. Left pleural effusion relatively unchanged. See above plan.     Clayton Bibles, NP 06/15/2021  Pt aware and understands NP's role.

## 2021-06-14 NOTE — Telephone Encounter (Signed)
Pt admitted 12/3-12/5 for large left sided pleural effusion. Thoracentesis on 12/4 with 1.8L dark bloody fluid. Unable to obtain LDH as sample was refrigerator. Cytologies were neg. Discharged 12/5 with follow up in office today. Repeat CXR showed unchanged large pleural effusion (left). Needs repeat thoracentesis this week with pleural fluid (LDH, triglycerides, protein, cytology, culture, pH, and glucose) and serum LDH and CMET. Hx of cirrhosis - recent PT/INR on 12/4 was PT 19.1 and INR 1.6. Thanks!

## 2021-06-14 NOTE — Assessment & Plan Note (Signed)
Improved symptoms. Pt stable. Left pleural effusion relatively unchanged. See above plan.

## 2021-06-14 NOTE — Telephone Encounter (Signed)
Called and spoke with pt letting her know that she is able to eat prior to coming to the office for the thora and pt said it was in regards to the CT scan with contrast. Read pt the instructions for the CT that it is 4 hours, not 2 and she verbalized understanding. Nothing further needed.

## 2021-06-14 NOTE — Addendum Note (Signed)
Addended by: Noemi Chapel on: 06/14/2021 01:18 PM   Modules accepted: Orders

## 2021-06-15 ENCOUNTER — Inpatient Hospital Stay: Admission: RE | Admit: 2021-06-15 | Payer: Managed Care, Other (non HMO) | Source: Ambulatory Visit

## 2021-06-15 ENCOUNTER — Ambulatory Visit: Payer: Managed Care, Other (non HMO) | Admitting: Pulmonary Disease

## 2021-06-15 ENCOUNTER — Telehealth: Payer: Self-pay | Admitting: Pulmonary Disease

## 2021-06-15 MED ORDER — LORAZEPAM 0.5 MG PO TABS: ORAL_TABLET | ORAL | 0 refills | Status: DC

## 2021-06-15 NOTE — Telephone Encounter (Signed)
I called her number but got the voice mail and I left a message We will have to cancel the CT as well if the thoracentesis is not being done  Will discuss with her in person as there is a follow up appointment with me on 12/13

## 2021-06-15 NOTE — Telephone Encounter (Signed)
Called and spoke with patient to let her know that I am working on get her procedure scheduled for Monday and that I just heard from the provider Dr. Francine Graven that he is able to do it Monday afternoon in the hospital. Advised her that it is after 5 and they are closed so that I will call them first thing Monday morning to try and get it scheduled. She expressed understanding.  Routing to Procedure pool.

## 2021-06-15 NOTE — Telephone Encounter (Signed)
Discussed with Micheline Maze, NP and Dr. Francine Graven. Prior fluid exudative, lymphocyte predominant. Plan for CT scan but evaluation limited by large effusion. Reasonable to try to drain as dry as possible and obtain follow up CT scan. Dr. Francine Graven available to perform at [facility] at 1 pm Monday, 12/12. Would schedule CT chest at [facility] at 3 pm so can obtain images shortly after drainage.

## 2021-06-15 NOTE — Telephone Encounter (Signed)
Will route to PM to advise. Thanks

## 2021-06-15 NOTE — Telephone Encounter (Signed)
Spoke with patient via phone and discussed her concerns regarding the thoracentesis and CT scan. Questions/concerns addressed. Pt feels comfortable moving forward with rescheduling the procedure and CT scan. Would prefer Ativan vs Xanax for preprocedure anxiety tx as this is what she has taken before. Will cancel rx for xanax and send in lorazepam to her pharmacy. Procedural pool contacted for scheduling. Thanks!

## 2021-06-15 NOTE — Telephone Encounter (Signed)
Will route back to [facility]

## 2021-06-15 NOTE — Telephone Encounter (Signed)
Called and spoke with patient to let her know that I am working on get her procedure scheduled for Monday and that I just heard from the provider Dr. Francine Graven that he is able to do it Monday afternoon in the hospital. Advised her that it is after 5 and they are closed so that I will call them first thing Monday morning to try and get it scheduled. She expressed understanding.  Routing to Procedure pool. Please see telephone encounter for further updates.

## 2021-06-15 NOTE — Addendum Note (Signed)
Addended by: Noemi Chapel on: 06/15/2021 01:33 PM   Modules accepted: Orders

## 2021-06-18 ENCOUNTER — Encounter (HOSPITAL_COMMUNITY): Payer: Self-pay | Admitting: Pulmonary Disease

## 2021-06-18 ENCOUNTER — Ambulatory Visit (HOSPITAL_COMMUNITY)
Admission: RE | Admit: 2021-06-18 | Discharge: 2021-06-18 | Disposition: A | Discharge status: Home / Self Care | Payer: Managed Care, Other (non HMO) | Source: Other Acute Inpatient Hospital | Attending: Pulmonary Disease | Admitting: Pulmonary Disease

## 2021-06-18 ENCOUNTER — Ambulatory Visit (INDEPENDENT_AMBULATORY_CARE_PROVIDER_SITE_OTHER)
Admission: RE | Admit: 2021-06-18 | Discharge: 2021-06-18 | Disposition: A | Discharge status: Home / Self Care | Payer: Managed Care, Other (non HMO) | Source: Ambulatory Visit | Attending: Nurse Practitioner | Admitting: Nurse Practitioner

## 2021-06-18 ENCOUNTER — Other Ambulatory Visit: Payer: Self-pay

## 2021-06-18 ENCOUNTER — Encounter (HOSPITAL_COMMUNITY): Admission: RE | Disposition: A | Discharge status: Home / Self Care | Payer: Self-pay | Attending: Pulmonary Disease

## 2021-06-18 DIAGNOSIS — J90 Pleural effusion, not elsewhere classified: Secondary | ICD-10-CM | POA: Diagnosis not present

## 2021-06-18 DIAGNOSIS — R0602 Shortness of breath: Secondary | ICD-10-CM

## 2021-06-18 DIAGNOSIS — R188 Other ascites: Secondary | ICD-10-CM | POA: Insufficient documentation

## 2021-06-18 DIAGNOSIS — K7460 Unspecified cirrhosis of liver: Secondary | ICD-10-CM | POA: Insufficient documentation

## 2021-06-18 HISTORY — PX: THORACENTESIS: SHX235

## 2021-06-18 LAB — AMYLASE, PLEURAL OR PERITONEAL FLUID: Amylase, Fluid: 42 U/L

## 2021-06-18 LAB — ALBUMIN, PLEURAL OR PERITONEAL FLUID: Albumin, Fluid: 1.8 g/dL

## 2021-06-18 LAB — GLUCOSE, PLEURAL OR PERITONEAL FLUID: Glucose, Fluid: 84 mg/dL

## 2021-06-18 LAB — BODY FLUID CELL COUNT WITH DIFFERENTIAL
Eos, Fluid: 2 %
Lymphs, Fluid: 82 %
Monocyte-Macrophage-Serous Fluid: 8 % — ABNORMAL LOW (ref 50–90)
Neutrophil Count, Fluid: 6 % (ref 0–25)
Other Cells, Fluid: 2 %
Total Nucleated Cell Count, Fluid: 1161 cu mm — ABNORMAL HIGH (ref 0–1000)

## 2021-06-18 LAB — PROTEIN, PLEURAL OR PERITONEAL FLUID: Total protein, fluid: 3.8 g/dL

## 2021-06-18 LAB — LACTATE DEHYDROGENASE, PLEURAL OR PERITONEAL FLUID: LD, Fluid: 298 U/L — ABNORMAL HIGH (ref 3–23)

## 2021-06-18 SURGERY — THORACENTESIS

## 2021-06-18 MED ORDER — IOHEXOL 300 MG/ML  SOLN
80.0000 mL | Freq: Once | INTRAMUSCULAR | Status: AC | PRN
  Administered 2021-06-18: 80 mL via INTRAVENOUS

## 2021-06-18 NOTE — Interval H&P Note (Signed)
History and Physical Interval Note:  06/18/2021 2:28 PM  Rebecca Bonilla  has presented today for surgery, with the diagnosis of pleural effusion.  The various methods of treatment have been discussed with the patient and family. After consideration of risks, benefits and other options for treatment, the patient has consented to  Procedure(s): THORACENTESIS (N/A) as a surgical intervention.  The patient's history has been reviewed, patient examined, no change in status, stable for surgery.  I have reviewed the patient's chart and labs.  Questions were answered to the patient's satisfaction.     [name]

## 2021-06-18 NOTE — Progress Notes (Signed)
Pt tolerated thoracentesis well, vital signs stable post procedure- see flowsheet. No chest xray before discharge per MD Dewald as pt has further imaging appointment immediately post procedure.

## 2021-06-18 NOTE — Telephone Encounter (Signed)
Called and spoke with patient. Let them know their Rebecca Bonilla is scheduled for 06/18/2021 with Dr. Francine Graven at 2:30.  Patient was instructed to arrive at hospital at 2:00. Patient instructed not to have anything to eat or drink.  Patient voiced understanding, nothing further needed  Routing to Dr. Francine Graven as [name]

## 2021-06-18 NOTE — Op Note (Signed)
Thoracentesis  Procedure Note  Rebecca Bonilla  [id number]  16-Sep-1975  Date:06/18/21  Time:3:22 PM   Provider Performing:Natesha Hassey B Nakaya Mishkin   Procedure: Thoracentesis with imaging guidance (82993)  Indication(s) Pleural Effusion  Consent Risks of the procedure as well as the alternatives and risks of each were explained to the patient and/or caregiver.  Consent for the procedure was obtained and is signed in the bedside chart  Anesthesia Topical only with 1% lidocaine    Time Out Verified patient identification, verified procedure, site/side was marked, verified correct patient position, special equipment/implants available, medications/allergies/relevant history reviewed, required imaging and test results available.   Sterile Technique Maximal sterile technique including full sterile barrier drape, hand hygiene, sterile gown, sterile gloves, mask, hair covering, sterile ultrasound probe cover (if used).  Procedure Description Ultrasound was used to identify appropriate pleural anatomy for placement and overlying skin marked.  Area of drainage cleaned and draped in sterile fashion. Lidocaine was used to anesthetize the skin and subcutaneous tissue.  1100 cc's of bloody appearing fluid was drained from the left pleural space. Catheter then removed and bandaid applied to site.   Complications/Tolerance None; patient tolerated the procedure well. Chest CT is ordered to confirm no post-procedural complication.   EBL Minimal   Specimen(s) Pleural fluid

## 2021-06-19 ENCOUNTER — Ambulatory Visit (INDEPENDENT_AMBULATORY_CARE_PROVIDER_SITE_OTHER): Payer: Managed Care, Other (non HMO) | Admitting: Pulmonary Disease

## 2021-06-19 ENCOUNTER — Encounter: Payer: Self-pay | Admitting: Pulmonary Disease

## 2021-06-19 VITALS — BP 120/68 | HR 107 | Temp 98.2°F | Ht 67.0 in | Wt 162.4 lb

## 2021-06-19 DIAGNOSIS — J90 Pleural effusion, not elsewhere classified: Secondary | ICD-10-CM | POA: Diagnosis not present

## 2021-06-19 NOTE — Patient Instructions (Signed)
As per our discussion I would recommend that he get admitted to get a chest tube placed for complete drainage of the fluid Since you will not be able to do those until Tuesday we will hold off on admission though we have a risk of forming fibrous tissue around the lung to prevent full reexpansion  Please give [place] a call on Tuesday once you have completed your obligations so we can call for a bed and get you admitted Follow-up in 1 month

## 2021-06-19 NOTE — Progress Notes (Signed)
Rebecca Bonilla    [id number]    Dec 11, 1975  Primary Care Physician:Jobe, Alexis Goodell., MD  Referring Physician: Lilian Coma., MD [address] [place] [address] [place],  [place] [id number]  Chief complaint: Follow-up for pleural effusion  HPI: 45 year old with EtOH cirrhosis, portal hypertension, recurrent left pleural effusion with thoracentesis in April 2022.  She was readmitted on 12/3 with worsening dyspnea, left effusion and had a repeat thoracentesis on 06/10/2021 with 1.8 L of bloody fluid removed.  She then had third thoracentesis on 12/12 with removal of 1.1 L of bloody appearing fluid  Pleural fluid studies show exudative fluid with lymphocyte predominance.  Cytology and microbiology are negative to date.  Overall she has some bruising and tenderness at the site of recent thoracentesis but denies any dyspnea, cough  Outpatient Encounter Medications as of 06/19/2021  Medication Sig   acetaminophen (TYLENOL) 160 MG chewable tablet Chew 160 mg by mouth daily as needed for pain.   clobetasol ointment (TEMOVATE) [id number] % Apply 1 application topically 2 (two) times daily as needed (psoriasis).   furosemide (LASIX) 20 MG tablet Take 20 mg by mouth every morning.   lidocaine (XYLOCAINE) 2 % solution Use as directed 5 mLs in the mouth or throat every 3 (three) hours as needed for mouth pain (tooth pain).   LORazepam (ATIVAN) 0.5 MG tablet Take 1/2 tablet (0.25 mg) by mouth 30 minutes prior to thoracentesis procedure. If no relief, can take other 1/2 tab (0.25mg ) by mouth. Do not drive after taking.   Potassium Chloride ER 20 MEQ TBCR Take 20 mEq by mouth every morning.   spironolactone (ALDACTONE) 50 MG tablet Take 50 mg by mouth every morning.   triamcinolone cream (KENALOG) 0.1 % Apply 1 application topically daily as needed (psoriasis).   [DISCONTINUED] ergocalciferol (VITAMIN D2) 1.25 MG (50000 UT) capsule Take 50,000 Units by mouth once a week. (Patient not taking:  Reported on 06/14/2021)   No facility-administered encounter medications on file as of 06/19/2021.    Allergies as of 06/19/2021 - Review Complete 06/19/2021  Allergen Reaction Noted   Clemastine Rash 05/25/2012   Hydrocodone Nausea And Vomiting 10/29/2020   Other Other (See Comments) 10/29/2020   Oxycodone Nausea Only 09/08/2020    Past Medical History:  Diagnosis Date   Alcohol abuse    Cirrhosis of liver with ascites ([place]) 08/14/2020   Hyponatremia 08/15/2020   Macrocytosis 10/29/2020   Portal hypertension ([place]) 08/14/2020    Past Surgical History:  Procedure Laterality Date   ANKLE SURGERY     x 2    Family History  Problem Relation Age of Onset   Huntington's disease Mother     Social History   Socioeconomic History   Marital status: Married    Spouse name: Not on file   Number of children: Not on file   Years of education: Not on file   Highest education level: Not on file  Occupational History   Not on file  Tobacco Use   Smoking status: Former    Packs/day: 0.50    Years: 10.00    Pack years: 5.00    Types: Cigarettes   Smokeless tobacco: Never  Vaping Use   Vaping Use: Former  Substance and Sexual Activity   Alcohol use: Not Currently   Drug use: Never   Sexual activity: Not on file  Other Topics Concern   Not on file  Social History Narrative   Not on  file   Social Determinants of Health   Financial Resource Strain: Not on file  Food Insecurity: Not on file  Transportation Needs: Not on file  Physical Activity: Not on file  Stress: Not on file  Social Connections: Not on file  Intimate Partner Violence: Not on file    Review of systems: Review of Systems  Constitutional: Negative for fever and chills.  HENT: Negative.   Eyes: Negative for blurred vision.  Respiratory: as per HPI  Cardiovascular: Negative for chest pain and palpitations.  Gastrointestinal: Negative for vomiting, diarrhea, blood per rectum. Genitourinary: Negative for  dysuria, urgency, frequency and hematuria.  Musculoskeletal: Negative for myalgias, back pain and joint pain.  Skin: Negative for itching and rash.  Neurological: Negative for dizziness, tremors, focal weakness, seizures and loss of consciousness.  Endo/Heme/Allergies: Negative for environmental allergies.  Psychiatric/Behavioral: Negative for depression, suicidal ideas and hallucinations.  All other systems reviewed and are negative.  Physical Exam: There were no vitals taken for this visit. Gen:      No acute distress HEENT:  EOMI, sclera anicteric Neck:     No masses; no thyromegaly Lungs:    Diminished breath sounds on the left CV:         Regular rate and rhythm; no murmurs Abd:      + bowel sounds; soft, non-tender; no palpable masses, no distension Ext:    No edema; adequate peripheral perfusion Skin:      Warm and dry; no rash Neuro: alert and oriented x 3 Psych: normal mood and affect  Data Reviewed: Imaging: CT chest 06/18/2021-large left pleural effusion remains with associated atelectasis of the left lower lobe.  There is subsegmental atelectasis in the left upper lobe.  I have reviewed the images personally.  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  Lymphocyte predominance is also concerning There is no objective evidence of infection  CT scan after last thoracentesis yesterday continues to show significant effusion with associated atelectasis She would need complete drainage given exudative nature of fluid.  If related remains there then there is possibility of loculation and lung entrapment  I have recommended that  she get admitted for pigtail placement for complete drainage and follow-up CT for evaluation of lung however she has some court hearings and a [facility] appointment coming up that she does not want to cancel.  She wants to call once these obligations are completed on Tuesday of next week to get admitted.  We have discussed risks of delaying chest tube placement in detail today and she is willing to take the risk and has made the informed decision to defer this procedure until next week  Plan/Recommendations: Admission for chest tube placement and follow-up CT after complete drainage Patient wants to defer procedure until next week and she will call us when she is ready for direct admission  Chilton Greathouse MD [facility] 06/19/2021, 11:06 AM  CC: Malka So., MD

## 2021-06-20 ENCOUNTER — Encounter (HOSPITAL_COMMUNITY): Payer: Self-pay | Admitting: Pulmonary Disease

## 2021-06-20 LAB — CYTOLOGY - NON PAP

## 2021-06-20 LAB — TRIGLYCERIDES, BODY FLUIDS: Triglycerides, Fluid: 72 mg/dL

## 2021-06-22 LAB — BODY FLUID CULTURE W GRAM STAIN: Culture: NO GROWTH

## 2021-06-22 LAB — CHOLESTEROL, BODY FLUID: Cholesterol, Fluid: 174 mg/dL

## 2021-06-26 ENCOUNTER — Telehealth: Payer: Self-pay | Admitting: Pulmonary Disease

## 2021-06-26 ENCOUNTER — Inpatient Hospital Stay (HOSPITAL_COMMUNITY)
Admission: AD | Admit: 2021-06-26 | Discharge: 2021-06-29 | DRG: 433 | Disposition: A | Discharge status: Home / Self Care | Payer: Managed Care, Other (non HMO) | Source: Ambulatory Visit | Attending: Pulmonary Disease | Admitting: Pulmonary Disease

## 2021-06-26 ENCOUNTER — Encounter (HOSPITAL_COMMUNITY): Payer: Self-pay | Admitting: Internal Medicine

## 2021-06-26 ENCOUNTER — Inpatient Hospital Stay (HOSPITAL_COMMUNITY): Payer: Managed Care, Other (non HMO)

## 2021-06-26 DIAGNOSIS — Z87891 Personal history of nicotine dependence: Secondary | ICD-10-CM

## 2021-06-26 DIAGNOSIS — Z79899 Other long term (current) drug therapy: Secondary | ICD-10-CM | POA: Diagnosis not present

## 2021-06-26 DIAGNOSIS — Z885 Allergy status to narcotic agent status: Secondary | ICD-10-CM | POA: Diagnosis not present

## 2021-06-26 DIAGNOSIS — D638 Anemia in other chronic diseases classified elsewhere: Secondary | ICD-10-CM | POA: Diagnosis present

## 2021-06-26 DIAGNOSIS — J90 Pleural effusion, not elsewhere classified: Secondary | ICD-10-CM | POA: Diagnosis present

## 2021-06-26 DIAGNOSIS — Z9689 Presence of other specified functional implants: Secondary | ICD-10-CM

## 2021-06-26 DIAGNOSIS — F1011 Alcohol abuse, in remission: Secondary | ICD-10-CM | POA: Diagnosis present

## 2021-06-26 DIAGNOSIS — K7030 Alcoholic cirrhosis of liver without ascites: Principal | ICD-10-CM | POA: Diagnosis present

## 2021-06-26 DIAGNOSIS — Z888 Allergy status to other drugs, medicaments and biological substances status: Secondary | ICD-10-CM

## 2021-06-26 DIAGNOSIS — J918 Pleural effusion in other conditions classified elsewhere: Secondary | ICD-10-CM | POA: Diagnosis present

## 2021-06-26 LAB — COMPREHENSIVE METABOLIC PANEL
ALT: 21 U/L (ref 0–44)
AST: 43 U/L — ABNORMAL HIGH (ref 15–41)
Albumin: 2.6 g/dL — ABNORMAL LOW (ref 3.5–5.0)
Alkaline Phosphatase: 131 U/L — ABNORMAL HIGH (ref 38–126)
Anion gap: 9 (ref 5–15)
BUN: 10 mg/dL (ref 6–20)
CO2: 24 mmol/L (ref 22–32)
Calcium: 9 mg/dL (ref 8.9–10.3)
Chloride: 97 mmol/L — ABNORMAL LOW (ref 98–111)
Creatinine, Ser: 0.88 mg/dL (ref 0.44–1.00)
GFR, Estimated: >60 mL/min (ref >60)
Glucose, Bld: 127 mg/dL — ABNORMAL HIGH (ref 70–99)
Potassium: 3.7 mmol/L (ref 3.5–5.1)
Sodium: 130 mmol/L — ABNORMAL LOW (ref 135–145)
Total Bilirubin: 6 mg/dL — ABNORMAL HIGH (ref 0.3–1.2)
Total Protein: 6.3 g/dL — ABNORMAL LOW (ref 6.5–8.1)

## 2021-06-26 LAB — CBC
HCT: 35.2 % — ABNORMAL LOW (ref 36.0–46.0)
Hemoglobin: 12.5 g/dL (ref 12.0–15.0)
MCH: 34.6 pg — ABNORMAL HIGH (ref 26.0–34.0)
MCHC: 35.5 g/dL (ref 30.0–36.0)
MCV: 97.5 fL (ref 80.0–100.0)
Platelets: 164 10*3/uL (ref 150–400)
RBC: 3.61 MIL/uL — ABNORMAL LOW (ref 3.87–5.11)
RDW: 12.6 % (ref 11.5–15.5)
WBC: 12.6 10*3/uL — ABNORMAL HIGH (ref 4.0–10.5)
nRBC: 0 % (ref 0.0–0.2)

## 2021-06-26 LAB — HIV ANTIBODY (ROUTINE TESTING W REFLEX): HIV Screen 4th Generation wRfx: NONREACTIVE

## 2021-06-26 LAB — PROTIME-INR
INR: 1.6 — ABNORMAL HIGH (ref 0.8–1.2)
Prothrombin Time: 18.5 seconds — ABNORMAL HIGH (ref 11.4–15.2)

## 2021-06-26 MED ORDER — LORAZEPAM 2 MG/ML IJ SOLN: 1.0000 mg | Freq: Once | INTRAMUSCULAR | Status: DC | PRN

## 2021-06-26 MED ORDER — POTASSIUM CHLORIDE CRYS ER 10 MEQ PO TBCR
20.0000 meq | EXTENDED_RELEASE_TABLET | Freq: Every morning | ORAL | Status: DC
  Administered 2021-06-27 – 2021-06-29 (×3): 20 meq via ORAL
  Filled 2021-06-26 (×4): qty 2

## 2021-06-26 MED ORDER — DOCUSATE SODIUM 100 MG PO CAPS: 100.0000 mg | ORAL_CAPSULE | Freq: Two times a day (BID) | ORAL | Status: DC | PRN

## 2021-06-26 MED ORDER — FUROSEMIDE 20 MG PO TABS
20.0000 mg | ORAL_TABLET | Freq: Every morning | ORAL | Status: DC
  Administered 2021-06-27 – 2021-06-29 (×3): 20 mg via ORAL
  Filled 2021-06-26 (×3): qty 1

## 2021-06-26 MED ORDER — HEPARIN SODIUM (PORCINE) 5000 UNIT/ML IJ SOLN
5000.0000 [IU] | Freq: Three times a day (TID) | INTRAMUSCULAR | Status: DC
  Administered 2021-06-27 – 2021-06-28 (×2): 5000 [IU] via SUBCUTANEOUS
  Filled 2021-06-26 (×5): qty 1

## 2021-06-26 MED ORDER — POLYETHYLENE GLYCOL 3350 17 G PO PACK: 17.0000 g | PACK | Freq: Every day | ORAL | Status: DC | PRN

## 2021-06-26 MED ORDER — SPIRONOLACTONE 25 MG PO TABS: 50.0000 mg | ORAL_TABLET | Freq: Every morning | ORAL | Status: DC

## 2021-06-26 NOTE — Telephone Encounter (Signed)
Called and spoke with patient about being directly admitted to hospital for pigtail placement per Dr. Isaiah Serge. Advised the patient that I have put in the request for a bed and they are going to call me once they have a room ready. She expressed understanding. She said she has a dentist appt that she is on her way to and she is going to give her husband her phone and that we can give him the information if she is not available. I will wait to hear back from bed placement.    Instructions  As per our discussion I would recommend that he get admitted to get a chest tube placed for complete drainage of the fluid Since you will not be able to do those until Tuesday we will hold off on admission though we have a risk of forming fibrous tissue around the lung to prevent full reexpansion   Please give [place] a call on Tuesday once you have completed your obligations so we can call for a bed and get you admitted Follow-up in 1 month

## 2021-06-26 NOTE — Progress Notes (Signed)
Pt received ion room 22. Alert and oriented. Pleasant. Assessment done. PIV inserted and vitals obtained. Pt oriented to environment and made comfortable.

## 2021-06-26 NOTE — Telephone Encounter (Signed)
Received call from bed placement that patient will be going to [id number]. Message has been sent to Dr. Kendrick Fries and Anders Simmonds to let them know. Called and spoke with patient to let her know of room number. She states that she is about an hour away coming home from another doctors appointment. Advised her if it is after 5:30 to go to the ED to check in and they will take her up to the room. She expressed understanding. Nothing further needed at this time.

## 2021-06-26 NOTE — Progress Notes (Signed)
eLink Physician-Brief Progress Note Patient Name: Rebecca Bonilla DOB: 22-Mar-1976 MRN: [id number]   Date of Service  06/26/2021  HPI/Events of Note  Patient scheduled for chest tube placement in AM and is ordered Heparin 5000 units [place] Q 8 hours. Patient is ambulating and baseline INR = 1.6.   eICU Interventions  Plan: Hold Heparin [place] until after chest tube placement.        Theseus Birnie Dennard Nip 06/26/2021, 10:31 PM

## 2021-06-26 NOTE — H&P (Signed)
NAME:  Rebecca Bonilla, MRN:  [id number], DOB:  1976/04/11, LOS: 0 ADMISSION DATE:  06/26/2021, CONSULTATION DATE:  12/20 REFERRING MD:  12/20, CHIEF COMPLAINT:  12/20    History of Present Illness:  45 year old female follwed by Dr Isaiah Serge for recurrent left exudative pleural effusion. Has hx as outlined below. Presented for f/u in office 12/13 to review post-thoracentesis CT chest imaging that continued to show fairly significant left effusion w/ related atelectasis. Because most recent pleural evaluation was lymphocyte predominant and exudative by lights criteria it was recommended that she have chest tube placement for complete evacuation and further infection analysis. She wanted to defer admission pending a Dr's appointment at [facility] and a pending court hearing. Presents 12/20 for recommended Chest tube placement.  Pertinent  Medical History  ETOH related Cirrhosis, Portal HTN, recurrent left effusion (s/p  thora X 3; last studies showed exudate collection; lymphocyte predominance. Cytology negative)  Pleural studies  10/30/2020-LDH 174, total protein 4.3, cells 990 with 19% lymphs 06/10/2021-LDH, protein not sent, cells 425 with 49% lymphs 06/18/2021-LDH 298, cells 1161 with 82% lymphs   Cytology  10/30/2020-reactive mesothelial cells 06/10/2021-no malignant cells   Significant Hospital Events: Including procedures, antibiotic start and stop dates in addition to other pertinent events   12//20 admitted for planned pleural drainage. 12/12 CT chest done prior to admit w/ large left pleural effusion   Interim History / Subjective:  No distress.   Objective   Blood pressure 132/76, pulse 100, temperature 98.3 F (36.8 C), temperature source Oral, resp. rate 16, SpO2 96 %.       No intake or output data in the 24 hours ending 06/26/21 1758 There were no vitals filed for this visit.  Examination: General: 45 year old female she is resting in bed. Fully ambulatory. 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 at-least to some degree Plan Set up for CT placement 12/21 Will send full pleural analysis   H/o ETOH related Cirrhosis  -followed at [facility] Plan Ck LFTs Cont home diuretics and , KCL   H/o ETOH related thrombocytopenia  Plan Ck CBC  H/o hyponatremia  -likely 2/2 cirrhosis  Plan Ck chem  Anemia of chronic disease Plan Trend CBC     Best Practice (right click and "Reselect all SmartList Selections" daily)   Diet/type: Regular consistency (see orders) DVT prophylaxis: prophylactic heparin  GI prophylaxis: N/A Lines: N/A Foley:  N/A Code Status:  full code Last date of multidisciplinary goals of care discussion [pending ]  Labs   CBC: No results for input(s): WBC, NEUTROABS, HGB, HCT, MCV, PLT in the last 168 hours.  Basic Metabolic Panel: No results for input(s): NA, K, CL, CO2, GLUCOSE, BUN, CREATININE, CALCIUM, MG, PHOS in the last 168 hours. GFR: Estimated Creatinine Clearance: 78.6 mL/min (by C-G formula based on SCr of 0.87 mg/dL). No results for input(s): PROCALCITON, WBC, LATICACIDVEN in the last 168 hours.  Liver Function Tests: No results for input(s): AST, ALT, ALKPHOS, BILITOT, PROT, ALBUMIN in the last 168 hours. No results for input(s): LIPASE,  AMYLASE in the last 168 hours. No results for input(s): AMMONIA in the last 168 hours.  ABG No results found for: PHART, PCO2ART, PO2ART, HCO3, TCO2, ACIDBASEDEF, O2SAT   Coagulation Profile: No results for input(s): INR, PROTIME in the last 168 hours.  Cardiac Enzymes: No  results for input(s): CKTOTAL, CKMB, CKMBINDEX, TROPONINI in the last 168 hours.  HbA1C: No results found for: HGBA1C  CBG: No results for input(s): GLUCAP in the last 168 hours.  Review of Systems:   Review of Systems  Constitutional:  Negative for diaphoresis, fever and malaise/fatigue.  HENT: Negative.    Eyes: Negative.   Respiratory:  Positive for cough.   Cardiovascular:  Negative for chest pain and leg swelling.  Gastrointestinal: Negative.   Genitourinary: Negative.   Musculoskeletal: Negative.   Skin: Negative.   Neurological: Negative.   Endo/Heme/Allergies: Negative.   Psychiatric/Behavioral: Negative.      Past Medical History:  She,  has a past medical history of Alcohol abuse, Cirrhosis of liver with ascites (HCC) (08/14/2020), Hyponatremia (08/15/2020), Macrocytosis (10/29/2020), and Portal hypertension (HCC) (08/14/2020).   Surgical History:   Past Surgical History:  Procedure Laterality Date   ANKLE SURGERY     x 2   THORACENTESIS N/A 06/18/2021   Procedure: THORACENTESIS;  Surgeon: [name], MD;  Location: [facility] ENDOSCOPY;  Service: Pulmonary;  Laterality: N/A;     Social History:   reports that she has quit smoking. Her smoking use included cigarettes. She has a 5.00 pack-year smoking history. She has never used smokeless tobacco. She reports that she does not currently use alcohol. She reports that she does not use drugs.   Family History:  Her family history includes Huntington's disease in her mother.   Allergies Allergies  Allergen Reactions   Clemastine Rash         Hydrocodone Nausea And Vomiting   Other Other (See Comments)    Albertsons dayhist - d  - unknown reaction (reaction to antihistamine that is no longer on the market)   Oxycodone Nausea Only    Nausea to opioids       Home Medications  Prior to Admission medications   Medication Sig Start Date End Date Taking? Authorizing Provider  acetaminophen (TYLENOL) 160 MG  chewable tablet Chew 160 mg by mouth daily as needed for pain.    [provider]  clobetasol ointment (TEMOVATE) 0.05 % Apply 1 application topically 2 (two) times daily as needed (psoriasis). 03/06/21   [provider]  furosemide (LASIX) 20 MG tablet Take 20 mg by mouth every morning.    [provider]  lidocaine (XYLOCAINE) 2 % solution Use as directed 5 mLs in the mouth or throat every 3 (three) hours as needed for mouth pain (tooth pain). 06/05/21   [provider]  LORazepam (ATIVAN) 0.5 MG tablet Take 1/2 tablet (0.25 mg) by mouth 30 minutes prior to thoracentesis procedure. If no relief, can take other 1/2 tab (0.25mg ) by mouth. Do not drive after taking. 06/15/21   Cobb, Ruby Cola, NP  Potassium Chloride ER 20 MEQ TBCR Take 20 mEq by mouth every morning. 05/05/21   [provider]  spironolactone (ALDACTONE) 50 MG tablet Take 50 mg by mouth every morning.    [provider]  triamcinolone cream (KENALOG) 0.1 % Apply 1 application topically daily as needed (psoriasis). 04/19/21   [provider]     Critical care time: NA  Simonne Martinet ACNP-BC [facility] Pulmonary/Critical Care Pager #  [phone] OR # [phone] if no answer

## 2021-06-27 ENCOUNTER — Encounter (HOSPITAL_COMMUNITY)
Admission: AD | Disposition: A | Discharge status: Home / Self Care | Payer: Self-pay | Source: Ambulatory Visit | Attending: Internal Medicine

## 2021-06-27 ENCOUNTER — Other Ambulatory Visit: Payer: Self-pay

## 2021-06-27 ENCOUNTER — Inpatient Hospital Stay (HOSPITAL_COMMUNITY): Payer: Managed Care, Other (non HMO)

## 2021-06-27 ENCOUNTER — Encounter (HOSPITAL_COMMUNITY): Payer: Self-pay | Admitting: Internal Medicine

## 2021-06-27 HISTORY — PX: CHEST TUBE INSERTION: SHX231

## 2021-06-27 SURGERY — CHEST TUBE INSERTION
Anesthesia: LOCAL | Laterality: Left

## 2021-06-27 SURGERY — CHEST TUBE INSERTION
Laterality: Left

## 2021-06-27 MED ORDER — LORAZEPAM 2 MG/ML IJ SOLN: 0.5000 mg | Freq: Once | INTRAMUSCULAR | Status: DC

## 2021-06-27 MED ORDER — SPIRONOLACTONE 25 MG PO TABS: 50.0000 mg | ORAL_TABLET | Freq: Every day | ORAL | Status: DC

## 2021-06-27 MED ORDER — LORAZEPAM 2 MG/ML IJ SOLN
INTRAMUSCULAR | Status: AC
  Filled 2021-06-27: qty 1

## 2021-06-27 MED ORDER — SPIRONOLACTONE 25 MG PO TABS
100.0000 mg | ORAL_TABLET | Freq: Every day | ORAL | Status: DC
  Administered 2021-06-28 – 2021-06-29 (×2): 100 mg via ORAL
  Filled 2021-06-27 (×3): qty 4

## 2021-06-27 MED ORDER — IBUPROFEN 400 MG PO TABS
400.0000 mg | ORAL_TABLET | ORAL | Status: DC | PRN
  Administered 2021-06-27 – 2021-06-29 (×6): 400 mg via ORAL
  Filled 2021-06-27 (×6): qty 1

## 2021-06-27 MED ORDER — SPIRONOLACTONE 25 MG PO TABS
100.0000 mg | ORAL_TABLET | Freq: Every day | ORAL | Status: DC
  Filled 2021-06-27: qty 4

## 2021-06-27 MED ORDER — FENTANYL CITRATE PF 50 MCG/ML IJ SOSY: 25.0000 ug | PREFILLED_SYRINGE | Freq: Once | INTRAMUSCULAR | Status: DC

## 2021-06-27 MED ORDER — LORAZEPAM 2 MG/ML IJ SOLN
0.5000 mg | Freq: Once | INTRAMUSCULAR | Status: DC
Start: 2021-06-27 — End: 2021-06-27

## 2021-06-27 NOTE — Interval H&P Note (Signed)
History and Physical Interval Note:  06/27/2021 2:56 PM  Rebecca Bonilla  has presented today for surgery, with the diagnosis of left pleural effusion.  The various methods of treatment have been discussed with the patient and family. After consideration of risks, benefits and other options for treatment, the patient has consented to  Procedure(s): CHEST TUBE INSERTION (Left) as a surgical intervention.  The patient's history has been reviewed, patient examined, no change in status, stable for surgery.  I have reviewed the patient's chart and labs.  Questions were answered to the patient's satisfaction.     Inari Shin A Aniel Hubble

## 2021-06-27 NOTE — Progress Notes (Signed)
NAME:  Rebecca Bonilla, MRN:  [id number], DOB:  March 13, 1976, LOS: 1 ADMISSION DATE:  06/26/2021, CONSULTATION DATE:  12/20 REFERRING MD:  12/20, CHIEF COMPLAINT:  12/20    History of Present Illness:  45 year old female follwed by Dr Isaiah Serge for recurrent left exudative pleural effusion. Has hx as outlined below. Presented for f/u in office 12/13 to review post-thoracentesis CT chest imaging that continued to show fairly significant left effusion w/ related atelectasis. Because most recent pleural evaluation was lymphocyte predominant and exudative by lights criteria it was recommended that she have chest tube placement for complete evacuation and further infection analysis. She wanted to defer admission pending a Dr's appointment at [facility] and a pending court hearing. Presents 12/20 for recommended Chest tube placement.  Pertinent  Medical History  ETOH related Cirrhosis, Portal HTN, recurrent left effusion (s/p  thora X 3; last studies showed exudate collection; lymphocyte predominance. Cytology negative)  Pleural studies  10/30/2020-LDH 174, total protein 4.3, cells 990 with 19% lymphs 06/10/2021-LDH, protein not sent, cells 425 with 49% lymphs 06/18/2021-LDH 298, cells 1161 with 82% lymphs   Cytology  10/30/2020-reactive mesothelial cells 06/10/2021-no malignant cells   Significant Hospital Events: Including procedures, antibiotic start and stop dates in addition to other pertinent events   12//20 admitted for planned pleural drainage. 12/12 CT chest done prior to admit w/ large left pleural effusion   Interim History / Subjective:  No distress.  No overnight events Comfortable  Objective   Blood pressure 101/72, pulse 96, temperature 98.7 F (37.1 C), temperature source Oral, resp. rate 20, weight 74.6 kg, SpO2 98 %.       No intake or output data in the 24 hours ending 06/27/21 1004 Filed Weights   06/27/21 0500  Weight: 74.6 kg    Examination: General: Comfortable,  does not appear to be in distress HENT: Moist oral mucosa Lungs: Decreased air entry at left base Cardiovascular: S1-S2 appreciated Abdomen: soft not tender  Extremities: warm and dry  Neuro: intact  GU: voids spont   Resolved Hospital Problem list     Assessment & Plan:   Recurrent large left pleural effusion -Concern for hydrothorax, as 3 thoracentesis -Plan for chest tube placement today 1221 -We will send fluid for repeat analysis -Fluid has been lymphocyte predominant  History of EtOH related cirrhosis -Follows up at [facility] -Continue diuretics  History of hyponatremia -related to cirrhosis  Anemia of chronic disease -Trend CBC  Chest tube placement scheduled for 3:00 today  Increase spironolactone to 100 daily Continue Lasix May need dose of Lasix increased  Best Practice (right click and "Reselect all SmartList Selections" daily)   Diet/type: Regular consistency (see orders) DVT prophylaxis: prophylactic heparin  GI prophylaxis: N/A Lines: N/A Foley:  N/A Code Status:  full code Last date of multidisciplinary goals of care discussion [pending ]  Labs   CBC: Recent Labs  Lab 06/26/21 2032  WBC 12.6*  HGB 12.5  HCT 35.2*  MCV 97.5  PLT 164    Basic Metabolic Panel: Recent Labs  Lab 06/26/21 2032  NA 130*  K 3.7  CL 97*  CO2 24  GLUCOSE 127*  BUN 10  CREATININE 0.88  CALCIUM 9.0   GFR: Estimated Creatinine Clearance: 84.2 mL/min (by C-G formula based on SCr of 0.88 mg/dL). Recent Labs  Lab 06/26/21 2032  WBC 12.6*    Liver Function Tests: Recent Labs  Lab 06/26/21 2032  AST 43*  ALT 21  ALKPHOS 131*  BILITOT  6.0*  PROT 6.3*  ALBUMIN 2.6*   No results for input(s): LIPASE, AMYLASE in the last 168 hours. No results for input(s): AMMONIA in the last 168 hours.  ABG No results found for: PHART, PCO2ART, PO2ART, HCO3, TCO2, ACIDBASEDEF, O2SAT   Coagulation Profile: Recent Labs  Lab 06/26/21 2032  INR 1.6*    Cardiac  Enzymes: No results for input(s): CKTOTAL, CKMB, CKMBINDEX, TROPONINI in the last 168 hours.  HbA1C: No results found for: HGBA1C  CBG: No results for input(s): GLUCAP in the last 168 hours.  Review of Systems:   Review of Systems  Constitutional:  Negative for diaphoresis, fever and malaise/fatigue.  HENT: Negative.    Eyes: Negative.   Respiratory:  Positive for cough.   Cardiovascular:  Negative for chest pain and leg swelling.  Gastrointestinal: Negative.   Genitourinary: Negative.   Musculoskeletal: Negative.   Skin: Negative.   Neurological: Negative.   Endo/Heme/Allergies: Negative.   Psychiatric/Behavioral: Negative.      Past Medical History:  She,  has a past medical history of Alcohol abuse, Cirrhosis of liver with ascites (HCC) (08/14/2020), Hyponatremia (08/15/2020), Macrocytosis (10/29/2020), and Portal hypertension (HCC) (08/14/2020).   Surgical History:   Past Surgical History:  Procedure Laterality Date   ANKLE SURGERY     x 2   THORACENTESIS N/A 06/18/2021   Procedure: THORACENTESIS;  Surgeon: [name], MD;  Location: [facility] ENDOSCOPY;  Service: Pulmonary;  Laterality: N/A;     Social History:   reports that she has quit smoking. Her smoking use included cigarettes. She has a 5.00 pack-year smoking history. She has never used smokeless tobacco. She reports that she does not currently use alcohol. She reports that she does not use drugs.   Family History:  Her family history includes Huntington's disease in her mother.   Allergies Allergies  Allergen Reactions   Clemastine Rash and Other (See Comments)    Tavist     Hydrocodone Nausea And Vomiting   Other Rash and Other (See Comments)    Albertson's Dayhist-D/generic of Tavist- (reaction to antihistamine that is no longer on the market)   Oxycodone Nausea Only and Other (See Comments)    Nausea to opioids      Virl Diamond, MD [facility] Pager: See Loretha Stapler

## 2021-06-27 NOTE — H&P (View-Only) (Signed)
NAME:  Rebecca Bonilla, MRN:  [id number], DOB:  10/19/1975, LOS: 1 ADMISSION DATE:  06/26/2021, CONSULTATION DATE:  12/20 REFERRING MD:  12/20, CHIEF COMPLAINT:  12/20    History of Present Illness:  45 year old female follwed by Dr Isaiah Serge for recurrent left exudative pleural effusion. Has hx as outlined below. Presented for f/u in office 12/13 to review post-thoracentesis CT chest imaging that continued to show fairly significant left effusion w/ related atelectasis. Because most recent pleural evaluation was lymphocyte predominant and exudative by lights criteria it was recommended that she have chest tube placement for complete evacuation and further infection analysis. She wanted to defer admission pending a Dr's appointment at [facility] and a pending court hearing. Presents 12/20 for recommended Chest tube placement.  Pertinent  Medical History  ETOH related Cirrhosis, Portal HTN, recurrent left effusion (s/p  thora X 3; last studies showed exudate collection; lymphocyte predominance. Cytology negative)  Pleural studies  10/30/2020-LDH 174, total protein 4.3, cells 990 with 19% lymphs 06/10/2021-LDH, protein not sent, cells 425 with 49% lymphs 06/18/2021-LDH 298, cells 1161 with 82% lymphs   Cytology  10/30/2020-reactive mesothelial cells 06/10/2021-no malignant cells   Significant Hospital Events: Including procedures, antibiotic start and stop dates in addition to other pertinent events   12//20 admitted for planned pleural drainage. 12/12 CT chest done prior to admit w/ large left pleural effusion   Interim History / Subjective:  No distress.  No overnight events Comfortable  Objective   Blood pressure 101/72, pulse 96, temperature 98.7 F (37.1 C), temperature source Oral, resp. rate 20, weight 74.6 kg, SpO2 98 %.       No intake or output data in the 24 hours ending 06/27/21 1004 Filed Weights   06/27/21 0500  Weight: 74.6 kg    Examination: General: Comfortable,  does not appear to be in distress HENT: Moist oral mucosa Lungs: Decreased air entry at left base Cardiovascular: S1-S2 appreciated Abdomen: soft not tender  Extremities: warm and dry  Neuro: intact  GU: voids spont   Resolved Hospital Problem list     Assessment & Plan:   Recurrent large left pleural effusion -Concern for hydrothorax, as 3 thoracentesis -Plan for chest tube placement today 1221 -We will send fluid for repeat analysis -Fluid has been lymphocyte predominant  History of EtOH related cirrhosis -Follows up at [facility] -Continue diuretics  History of hyponatremia -related to cirrhosis  Anemia of chronic disease -Trend CBC  Chest tube placement scheduled for 3:00 today  Increase spironolactone to 100 daily Continue Lasix May need dose of Lasix increased  Best Practice (right click and "Reselect all SmartList Selections" daily)   Diet/type: Regular consistency (see orders) DVT prophylaxis: prophylactic heparin  GI prophylaxis: N/A Lines: N/A Foley:  N/A Code Status:  full code Last date of multidisciplinary goals of care discussion [pending ]  Labs   CBC: Recent Labs  Lab 06/26/21 2032  WBC 12.6*  HGB 12.5  HCT 35.2*  MCV 97.5  PLT 164    Basic Metabolic Panel: Recent Labs  Lab 06/26/21 2032  NA 130*  K 3.7  CL 97*  CO2 24  GLUCOSE 127*  BUN 10  CREATININE 0.88  CALCIUM 9.0   GFR: Estimated Creatinine Clearance: 84.2 mL/min (by C-G formula based on SCr of 0.88 mg/dL). Recent Labs  Lab 06/26/21 2032  WBC 12.6*    Liver Function Tests: Recent Labs  Lab 06/26/21 2032  AST 43*  ALT 21  ALKPHOS 131*  BILITOT  6.0*  PROT 6.3*  ALBUMIN 2.6*   No results for input(s): LIPASE, AMYLASE in the last 168 hours. No results for input(s): AMMONIA in the last 168 hours.  ABG No results found for: PHART, PCO2ART, PO2ART, HCO3, TCO2, ACIDBASEDEF, O2SAT   Coagulation Profile: Recent Labs  Lab 06/26/21 2032  INR 1.6*    Cardiac  Enzymes: No results for input(s): CKTOTAL, CKMB, CKMBINDEX, TROPONINI in the last 168 hours.  HbA1C: No results found for: HGBA1C  CBG: No results for input(s): GLUCAP in the last 168 hours.  Review of Systems:   Review of Systems  Constitutional:  Negative for diaphoresis, fever and malaise/fatigue.  HENT: Negative.    Eyes: Negative.   Respiratory:  Positive for cough.   Cardiovascular:  Negative for chest pain and leg swelling.  Gastrointestinal: Negative.   Genitourinary: Negative.   Musculoskeletal: Negative.   Skin: Negative.   Neurological: Negative.   Endo/Heme/Allergies: Negative.   Psychiatric/Behavioral: Negative.      Past Medical History:  She,  has a past medical history of Alcohol abuse, Cirrhosis of liver with ascites (HCC) (08/14/2020), Hyponatremia (08/15/2020), Macrocytosis (10/29/2020), and Portal hypertension (HCC) (08/14/2020).   Surgical History:   Past Surgical History:  Procedure Laterality Date   ANKLE SURGERY     x 2   THORACENTESIS N/A 06/18/2021   Procedure: THORACENTESIS;  Surgeon: [name], MD;  Location: [facility] ENDOSCOPY;  Service: Pulmonary;  Laterality: N/A;     Social History:   reports that she has quit smoking. Her smoking use included cigarettes. She has a 5.00 pack-year smoking history. She has never used smokeless tobacco. She reports that she does not currently use alcohol. She reports that she does not use drugs.   Family History:  Her family history includes Huntington's disease in her mother.   Allergies Allergies  Allergen Reactions   Clemastine Rash and Other (See Comments)    Tavist     Hydrocodone Nausea And Vomiting   Other Rash and Other (See Comments)    Albertson's Dayhist-D/generic of Tavist- (reaction to antihistamine that is no longer on the market)   Oxycodone Nausea Only and Other (See Comments)    Nausea to opioids      Virl Diamond, MD [place] [facility] Pager: See Loretha Stapler

## 2021-06-27 NOTE — Brief Op Note (Signed)
06/26/2021 - 06/27/2021  3:58 PM  PATIENT:  Rebecca Bonilla  45 y.o. female  PRE-OPERATIVE DIAGNOSIS:  left pleural effusion  POST-OPERATIVE DIAGNOSIS:  chest tube placement  PROCEDURE:  Procedure(s): CHEST TUBE INSERTION (Left)  SURGEON:  Surgeon(s) and Role:    * Venie Montesinos, Minna Antis, MD - Primary  PHYSICIAN ASSISTANT:   ASSISTANTS: none   ANESTHESIA:   local  EBL:  minimal    BLOOD ADMINISTERED:none  DRAINS: chest tube in place  LOCAL MEDICATIONS USED:  LIDOCAINE   SPECIMEN:  Source of Specimen:  left pleural fluid sent for analysis  DISPOSITION OF SPECIMEN:  N/A

## 2021-06-27 NOTE — Progress Notes (Signed)
°  Transition of Care [facility]) Screening Note   Patient Details  Name: Rebecca Bonilla Date of Birth: 19-Jan-1976   Transition of Care [facility]) CM/SW Contact:    Harriet Masson, RN Phone Number: 06/27/2021, 1:02 PM    Transition of Care Department [facility]) has reviewed patient and no TOC needs have been identified at this time. We will continue to monitor patient advancement through interdisciplinary progression rounds. If new patient transition needs arise, please place a TOC consult.

## 2021-06-27 NOTE — Plan of Care (Signed)

## 2021-06-28 ENCOUNTER — Inpatient Hospital Stay (HOSPITAL_COMMUNITY): Payer: Managed Care, Other (non HMO)

## 2021-06-28 ENCOUNTER — Encounter (HOSPITAL_COMMUNITY): Payer: Self-pay | Admitting: Pulmonary Disease

## 2021-06-28 NOTE — Progress Notes (Signed)
NAME:  Rebecca Bonilla, MRN:  [id number], DOB:  07/31/1975, LOS: 2 ADMISSION DATE:  06/26/2021, CONSULTATION DATE:  12/20 REFERRING MD:  12/20, CHIEF COMPLAINT:  12/20    History of Present Illness:  45 year old female follwed by Dr Isaiah Serge for recurrent left exudative pleural effusion. Has hx as outlined below. Presented for f/u in office 12/13 to review post-thoracentesis CT chest imaging that continued to show fairly significant left effusion w/ related atelectasis. Because most recent pleural evaluation was lymphocyte predominant and exudative by lights criteria it was recommended that she have chest tube placement for complete evacuation and further infection analysis. She wanted to defer admission pending a Dr's appointment at [facility] and a pending court hearing. Presents 12/20 for recommended Chest tube placement.  Chest tube placed 12/21 Pertinent  Medical History  ETOH related Cirrhosis, Portal HTN, recurrent left effusion (s/p  thora X 3; last studies showed exudate collection; lymphocyte predominance. Cytology negative)  Pleural studies  10/30/2020-LDH 174, total protein 4.3, cells 990 with 19% lymphs 06/10/2021-LDH, protein not sent, cells 425 with 49% lymphs 06/18/2021-LDH 298, cells 1161 with 82% lymphs   Cytology  10/30/2020-reactive mesothelial cells 06/10/2021-no malignant cells   Significant Hospital Events: Including procedures, antibiotic start and stop dates in addition to other pertinent events   12//20 admitted for planned pleural drainage. 12/12 CT chest done prior to admit w/ large left pleural effusion  12/22 chest tube placed 1221  Interim History / Subjective:  No distress.  Did have some pain at chest tube placement site  Objective   Blood pressure 108/67, pulse 86, temperature 98.1 F (36.7 C), temperature source Oral, resp. rate 15, height 5\' 7"  (1.702 m), weight 75 kg, SpO2 99 %.        Intake/Output Summary (Last 24 hours) at 06/28/2021 0910 Last  data filed at 06/28/2021 0500 Gross per 24 hour  Intake 960 ml  Output 1525 ml  Net -565 ml   Filed Weights   06/27/21 0500 06/27/21 1512 06/28/21 0402  Weight: 74.6 kg 74.5 kg 75 kg    Examination: General: No distress HENT: Moist oral mucosa Lungs: Good air entry bilaterally Cardiovascular: S1-S2 appreciated Abdomen: Soft, nontender  I did attempt to aspirate fluid from chest tube today -No significant effluent -[place] has about 1200 cc fluid in it  Resolved Hospital Problem list     Assessment & Plan:   Recurrent large left pleural effusion -Concern for hydrothorax -Has had 3 thoracentesis -Chest tube placed 1221 -Lymphocyte predominant fluid  Fluid analysis was requested but was not sent  History of EtOH related cirrhosis -Follows up at [facility] -On Lasix 20, spironolactone increased to 100 -We will follow-up on electrolytes   History of hyponatremia-related to cirrhosis  Anemia of chronic disease -Trend CBC  Continue other lines of care  We will get a chest x-ray this morning  If no significant fluid production -May discontinue chest tube at 5 PM today -I did discuss discharge plans with patient  Management of chest tube at present will not affect possibility of fluid reaccumulation -Increase in diuretics, fluid status management will probably have the most significant effect on fluid buildup   Best Practice (right click and "Reselect all SmartList Selections" daily)   Diet/type: Regular consistency (see orders) DVT prophylaxis: prophylactic heparin  GI prophylaxis: N/A Lines: N/A Foley:  N/A Code Status:  full code Last date of multidisciplinary goals of care discussion [pending ]  Labs   CBC: Recent Labs  Lab 06/26/21  2032  WBC 12.6*  HGB 12.5  HCT 35.2*  MCV 97.5  PLT 164     Basic Metabolic Panel: Recent Labs  Lab 06/26/21 2032  NA 130*  K 3.7  CL 97*  CO2 24  GLUCOSE 127*  BUN 10  CREATININE 0.88  CALCIUM 9.0     GFR: Estimated Creatinine Clearance: 84.5 mL/min (by C-G formula based on SCr of 0.88 mg/dL). Recent Labs  Lab 06/26/21 2032  WBC 12.6*     Liver Function Tests: Recent Labs  Lab 06/26/21 2032  AST 43*  ALT 21  ALKPHOS 131*  BILITOT 6.0*  PROT 6.3*  ALBUMIN 2.6*    No results for input(s): LIPASE, AMYLASE in the last 168 hours. No results for input(s): AMMONIA in the last 168 hours.  ABG No results found for: PHART, PCO2ART, PO2ART, HCO3, TCO2, ACIDBASEDEF, O2SAT   Coagulation Profile: Recent Labs  Lab 06/26/21 2032  INR 1.6*     Cardiac Enzymes: No results for input(s): CKTOTAL, CKMB, CKMBINDEX, TROPONINI in the last 168 hours.  HbA1C: No results found for: HGBA1C  CBG: No results for input(s): GLUCAP in the last 168 hours.  Review of Systems:   Review of Systems  Constitutional:  Negative for diaphoresis, fever and malaise/fatigue.  HENT: Negative.    Eyes: Negative.   Respiratory:  Positive for cough.   Cardiovascular:  Negative for chest pain and leg swelling.  Gastrointestinal: Negative.   Genitourinary: Negative.   Musculoskeletal: Negative.   Skin: Negative.   Neurological: Negative.   Endo/Heme/Allergies: Negative.   Psychiatric/Behavioral: Negative.      Past Medical History:  She,  has a past medical history of Alcohol abuse, Cirrhosis of liver with ascites (HCC) (08/14/2020), Hyponatremia (08/15/2020), Macrocytosis (10/29/2020), and Portal hypertension (HCC) (08/14/2020).   Surgical History:   Past Surgical History:  Procedure Laterality Date   ANKLE SURGERY     x 2   THORACENTESIS N/A 06/18/2021   Procedure: THORACENTESIS;  Surgeon: [name], MD;  Location: [facility] ENDOSCOPY;  Service: Pulmonary;  Laterality: N/A;     Social History:   reports that she has quit smoking. Her smoking use included cigarettes. She has a 5.00 pack-year smoking history. She has never used smokeless tobacco. She reports that she does not currently use  alcohol. She reports that she does not use drugs.   Family History:  Her family history includes Huntington's disease in her mother.   Allergies Allergies  Allergen Reactions   Clemastine Rash and Other (See Comments)    Tavist     Hydrocodone Nausea And Vomiting   Other Rash and Other (See Comments)    Albertson's Dayhist-D/generic of Tavist- (reaction to antihistamine that is no longer on the market)   Oxycodone Nausea Only and Other (See Comments)    Nausea to opioids      Virl Diamond, MD [place] [facility] Pager: See Loretha Stapler

## 2021-06-28 NOTE — Progress Notes (Signed)
About 100 cc in the last 12 hours from chest tube  We will plan on discontinuing tube in a.m.

## 2021-06-29 ENCOUNTER — Telehealth: Payer: Self-pay | Admitting: Pulmonary Disease

## 2021-06-29 LAB — CYTOLOGY - NON PAP

## 2021-06-29 MED ORDER — LORAZEPAM 2 MG/ML IJ SOLN
0.5000 mg | Freq: Once | INTRAMUSCULAR | Status: AC
  Administered 2021-06-29: 08:00:00 0.5 mg via INTRAVENOUS
  Filled 2021-06-29: qty 1

## 2021-06-29 MED ORDER — SPIRONOLACTONE 100 MG PO TABS: 100.0000 mg | ORAL_TABLET | Freq: Every day | ORAL | 3 refills | Status: DC

## 2021-06-29 NOTE — Discharge Summary (Signed)
Physician Discharge Summary  Patient ID: Daksha Koone MRN: [id number] DOB/AGE: Mar 22, 1976 45 y.o.  Admit date: 06/26/2021 Discharge date: 06/29/2021  Admission Diagnoses:  Discharge Diagnoses:  Principal Problem:   Recurrent left pleural effusion   Discharged Condition: stable  Hospital Course: Patient was admitted 12/20 with recurrent pleural effusion Chest tube placed 12/21 with drainage of 1270 cc of bloody fluid Chest x-ray did improve Patient remained more dynamically stable Chest tube was removed 12/23 without significant problems  I did let the patient know that draining the fluid completely does not preclude fluid reaccumulation Dose of spironolactone increased to 200 Dose of Lasix 20  Needs continued follow-up Needs close monitoring of electrolytes secondary to increased dose of spironolactone    Consults: None  Significant Diagnostic Studies: none Electrolytes within normal limits Fluid analysis was requested but was not sent off  Treatments: procedures: Chest tube placement, drained 1270 cc of bloody fluid  Discharge Exam: Blood pressure 114/70, pulse 100, temperature 97.6 F (36.4 C), temperature source Axillary, resp. rate 18, height 5\' 7"  (1.702 m), weight 75 kg, SpO2 100 %. General appearance: alert, cooperative, and icteric Resp: clear to auscultation bilaterally Cardio: S1, S2 normal Bowel sounds appreciated  Disposition: Discharged home in stable state  We will have her see Dr. in about 4 to 6 weeks   Allergies as of 06/29/2021       Reactions   Clemastine Rash, Other (See Comments)   Tavist   Hydrocodone Nausea And Vomiting   Other Rash, Other (See Comments)   Albertson's Dayhist-D/generic of Tavist- (reaction to antihistamine that is no longer on the market)   Oxycodone Nausea Only, Other (See Comments)   Nausea to opioids        Medication List     TAKE these medications    clobetasol ointment 0.05  % Commonly known as: TEMOVATE Apply 1 application topically 2 (two) times daily as needed (psoriasis- bilateral legs).   furosemide 20 MG tablet Commonly known as: LASIX Take 20 mg by mouth every morning.   lidocaine 2 % solution Commonly known as: XYLOCAINE Use as directed 5 mLs in the mouth or throat every 3 (three) hours as needed (tooth pain).   LORazepam 0.5 MG tablet Commonly known as: ATIVAN Take 1/2 tablet (0.25 mg) by mouth 30 minutes prior to thoracentesis procedure. If no relief, can take other 1/2 tab (0.25mg ) by mouth. Do not drive after taking. What changed:  how much to take how to take this when to take this   Potassium Chloride ER 20 MEQ Tbcr Take 20 mEq by mouth every morning.   spironolactone 100 MG tablet Commonly known as: ALDACTONE Take 1 tablet (100 mg total) by mouth daily. What changed:  medication strength how much to take when to take this   triamcinolone cream 0.1 % Commonly known as: KENALOG Apply 1 application topically daily as needed (psoriasis- bilateral legs).   Tylenol Childrens Chewables 160 MG Chew Generic drug: Acetaminophen Childrens Chew 80 mg by mouth daily as needed (for headaches).        Discharged in stable state  Signed: Florena Kozma A Eagan Shifflett 06/29/2021, 8:31 AM

## 2021-06-29 NOTE — Telephone Encounter (Signed)
Being discharged from the hospital today  Follow-up appointment with Dr. Isaiah Serge in 4 to 6 weeks   Chest x-ray before next visit-May be done on day of visit

## 2021-06-29 NOTE — Progress Notes (Signed)
Rn at bedside. Patient appears calm and free of pain. AVS given to patient by RN. All questions by patient answered by RN. All tele-monitoring connections removed by RN. IV access removed by RN. No complications observed. CNA to deliver patient to d hospital drop off location. Consulting civil engineer notified.

## 2021-06-29 NOTE — Progress Notes (Signed)
NAME:  Marc Sivertsen, MRN:  [id number], DOB:  [birth year], LOS: 3 ADMISSION DATE:  06/26/2021, CONSULTATION DATE:  12/20 REFERRING MD:  12/20, CHIEF COMPLAINT:  12/20    History of Present Illness:  45 year old female follwed by Dr Isaiah Serge for recurrent left exudative pleural effusion. Has hx as outlined below. Presented for f/u in office 12/13 to review post-thoracentesis CT chest imaging that continued to show fairly significant left effusion w/ related atelectasis. Because most recent pleural evaluation was lymphocyte predominant and exudative by lights criteria it was recommended that she have chest tube placement for complete evacuation and further infection analysis. She wanted to defer admission pending a Dr's appointment at [facility] and a pending court hearing. Presents 12/20 for recommended Chest tube placement.  Chest tube placed 12/21  Pertinent  Medical History  ETOH related Cirrhosis, Portal HTN, recurrent left effusion (s/p  thora X 3; last studies showed exudate collection; lymphocyte predominance. Cytology negative)  Pleural studies  10/30/2020-LDH 174, total protein 4.3, cells 990 with 19% lymphs 06/10/2021-LDH, protein not sent, cells 425 with 49% lymphs 06/18/2021-LDH 298, cells 1161 with 82% lymphs   Cytology  10/30/2020-reactive mesothelial cells 06/10/2021-no malignant cells   Significant Hospital Events: Including procedures, antibiotic start and stop dates in addition to other pertinent events   12//20 admitted for planned pleural drainage. 12/12 CT chest done prior to admit w/ large left pleural effusion  12/22 chest tube placed 1221  Interim History / Subjective:  No distress.  Did have some pain at chest tube placement site  Objective   Blood pressure 114/70, pulse 100, temperature 97.6 F (36.4 C), temperature source Axillary, resp. rate 18, height 5\' 7"  (1.702 m), weight 75 kg, SpO2 100 %.        Intake/Output Summary (Last 24 hours) at 06/29/2021  0757 Last data filed at 06/28/2021 1900 Gross per 24 hour  Intake --  Output 100 ml  Net -100 ml    Filed Weights   06/27/21 0500 06/27/21 1512 06/28/21 0402  Weight: 74.6 kg 74.5 kg 75 kg   Examination: General: No overnight events HENT: Moist mucosa Lungs: Good air entry bilaterally Cardiovascular: S1-S2 appreciated Abdomen: Soft, nontender  I did attempt to aspirate fluid from chest tube 06/28/21 -No significant effluent  Resolved Hospital Problem list     Assessment & Plan:   Recurrent large left pleural effusion -Concern for hydrothorax -Has had 3 thoracentesis -Chest tube placed 12/21 -Lymphocyte predominant fluid  History of EtOH related cirrhosis -Follows up at 1/22 -On Lasix 20, spironolactone increased to 100 -We will follow-up on electrolytes  History of hyponatremia-related to cirrhosis  Anemia of chronic disease -Trend CBC  Continue other lines of care  Chest x-ray with significant improvement  With no significant fluid Effler -We will discontinue tube today and plan for discharge   Management of chest tube at present will not affect possibility of fluid reaccumulation -Increase in diuretics, fluid status management will probably have the most significant effect on fluid buildup   Best Practice (right click and "Reselect all SmartList Selections" daily)   Diet/type: Regular consistency (see orders) DVT prophylaxis: prophylactic heparin  GI prophylaxis: N/A Lines: N/A Foley:  N/A Code Status:  full code Last date of multidisciplinary goals of care discussion [pending ]  Labs   CBC: Recent Labs  Lab 06/26/21 2032  WBC 12.6*  HGB 12.5  HCT 35.2*  MCV 97.5  PLT 164     Basic Metabolic Panel: Recent Labs  Lab 06/26/21 2032  NA 130*  K 3.7  CL 97*  CO2 24  GLUCOSE 127*  BUN 10  CREATININE 0.88  CALCIUM 9.0    GFR: Estimated Creatinine Clearance: 84.5 mL/min (by C-G formula based on SCr of 0.88 mg/dL). Recent Labs   Lab 06/26/21 2032  WBC 12.6*     Liver Function Tests: Recent Labs  Lab 06/26/21 2032  AST 43*  ALT 21  ALKPHOS 131*  BILITOT 6.0*  PROT 6.3*  ALBUMIN 2.6*    No results for input(s): LIPASE, AMYLASE in the last 168 hours. No results for input(s): AMMONIA in the last 168 hours.  ABG No results found for: PHART, PCO2ART, PO2ART, HCO3, TCO2, ACIDBASEDEF, O2SAT   Coagulation Profile: Recent Labs  Lab 06/26/21 2032  INR 1.6*     Cardiac Enzymes: No results for input(s): CKTOTAL, CKMB, CKMBINDEX, TROPONINI in the last 168 hours.  HbA1C: No results found for: HGBA1C  CBG: No results for input(s): GLUCAP in the last 168 hours.  Review of Systems:   Review of Systems  Constitutional:  Negative for diaphoresis, fever and malaise/fatigue.  HENT: Negative.    Eyes: Negative.   Respiratory:  Positive for cough.   Cardiovascular:  Negative for chest pain and leg swelling.  Gastrointestinal: Negative.   Genitourinary: Negative.   Musculoskeletal: Negative.   Skin: Negative.   Neurological: Negative.   Endo/Heme/Allergies: Negative.   Psychiatric/Behavioral: Negative.      Past Medical History:  She,  has a past medical history of Alcohol abuse, Cirrhosis of liver with ascites (HCC) (08/14/2020), Hyponatremia (08/15/2020), Macrocytosis (10/29/2020), and Portal hypertension (HCC) (08/14/2020).   Surgical History:   Past Surgical History:  Procedure Laterality Date   ANKLE SURGERY     x 2   CHEST TUBE INSERTION Left 06/27/2021   Procedure: CHEST TUBE INSERTION;  Surgeon: Tomma Lightning, MD;  Location: [facility] ENDOSCOPY;  Service: Pulmonary;  Laterality: Left;   THORACENTESIS N/A 06/18/2021   Procedure: Alanson Puls;  Surgeon: [name], MD;  Location: [facility] ENDOSCOPY;  Service: Pulmonary;  Laterality: N/A;     Social History:   reports that she has quit smoking. Her smoking use included cigarettes. She has a 5.00 pack-year smoking history. She has never used  smokeless tobacco. She reports that she does not currently use alcohol. She reports that she does not use drugs.   Family History:  Her family history includes Huntington's disease in her mother.   Allergies Allergies  Allergen Reactions   Clemastine Rash and Other (See Comments)    Tavist     Hydrocodone Nausea And Vomiting   Other Rash and Other (See Comments)    Albertson's Dayhist-D/generic of Tavist- (reaction to antihistamine that is no longer on the market)   Oxycodone Nausea Only and Other (See Comments)    Nausea to opioids      Virl Diamond, MD [place] [facility] Pager: See Loretha Stapler

## 2021-06-29 NOTE — Progress Notes (Signed)
Chest tube removed uneventfully

## 2021-06-29 NOTE — Plan of Care (Signed)

## 2021-06-29 NOTE — Telephone Encounter (Signed)
I have made an appointment for this patient in 1 month with Dr. Isaiah Serge to establish. Nothing further needed.

## 2021-06-30 NOTE — Procedures (Signed)
Insertion of Chest Tube Procedure Note  Rebecca Bonilla  [id number]  April 07, 1976  Date:06/30/21  Time:1:13 PM    Provider Performing: Onnie Boer A Tollie Canada   Procedure: Chest Tube Insertion (32551)  Indication(s) Effusion  Consent Risks of the procedure as well as the alternatives and risks of each were explained to the patient and/or caregiver.  Consent for the procedure was obtained and is signed in the bedside chart  Anesthesia Topical only with 1% lidocaine    Time Out Verified patient identification, verified procedure, site/side was marked, verified correct patient position, special equipment/implants available, medications/allergies/relevant history reviewed, required imaging and test results available.   Sterile Technique Maximal sterile technique including full sterile barrier drape, hand hygiene, sterile gown, sterile gloves, mask, hair covering, sterile ultrasound probe cover (if used).   Procedure Description Ultrasound used to identify appropriate pleural anatomy for placement and overlying skin marked. Area of placement cleaned and draped in sterile fashion.  A 14 French pigtail pleural catheter was placed into the left pleural space using Seldinger technique. Appropriate return of 500cc dark serous fluid was obtained.  The tube was connected to atrium and placed on -20 cm H2O wall suction.   Complications/Tolerance None; patient tolerated the procedure well. Chest X-ray is ordered to verify placement.   EBL Minimal  Specimen(s) fluid

## 2021-07-05 ENCOUNTER — Encounter: Payer: Self-pay | Admitting: Pulmonary Disease

## 2021-07-05 NOTE — Telephone Encounter (Signed)
Received the following message from patient:   "Hi Dr. Val Eagle- Happy new year! Feeling great. Giving myself lots of time to rest and recover but the incision looks great- no signs of any bad bruising or infection. And of course, my breathing is much better than before. I have blood being drawn tomorrow by my PCP Dr. Derrell Lolling and an appointment set with Dr Isaiah Serge on the 24th unless youd like it sooner.   Thanks for everything- Rebecca Bonilla"

## 2021-07-06 LAB — MISC LABCORP TEST (SEND OUT): Labcorp test code: 9985

## 2021-07-17 LAB — FUNGUS CULTURE WITH STAIN

## 2021-07-17 LAB — FUNGUS CULTURE RESULT

## 2021-07-17 LAB — FUNGAL ORGANISM REFLEX

## 2021-07-31 ENCOUNTER — Other Ambulatory Visit: Payer: Self-pay

## 2021-07-31 ENCOUNTER — Ambulatory Visit: Payer: Managed Care, Other (non HMO) | Admitting: Pulmonary Disease

## 2021-07-31 ENCOUNTER — Encounter: Payer: Self-pay | Admitting: Pulmonary Disease

## 2021-07-31 ENCOUNTER — Ambulatory Visit (INDEPENDENT_AMBULATORY_CARE_PROVIDER_SITE_OTHER): Payer: Managed Care, Other (non HMO)

## 2021-07-31 VITALS — BP 128/62 | HR 92 | Temp 98.3°F | Ht 68.0 in | Wt 170.4 lb

## 2021-07-31 DIAGNOSIS — J90 Pleural effusion, not elsewhere classified: Secondary | ICD-10-CM

## 2021-07-31 NOTE — Patient Instructions (Signed)
I am glad you are doing well Your results from the last pleural drainage look okay.  We will get a chest x-ray today to reevaluate Follow-up in 3 months

## 2021-07-31 NOTE — Progress Notes (Signed)
Rebecca Bonilla    [id number]    1975-11-20  Primary Care Physician:Jobe, Garrison [place]., MD  Referring Physician: Malka So., MD [address] [place] [address] [place],  [place] [address]  Chief complaint: Follow-up for recurrent pleural effusion  HPI: 46 year old with EtOH cirrhosis, portal hypertension, recurrent left pleural effusion with thoracentesis in April 2022.  She was readmitted on 12/3 with worsening dyspnea, left effusion and had a repeat thoracentesis on 06/10/2021 with 1.8 L of bloody fluid removed.  She then had third thoracentesis on 12/12 with removal of 1.1 L of bloody appearing fluid  Pleural fluid studies show exudative fluid with lymphocyte predominance.  Cytology and microbiology are negative to date.  Interim history: She was admitted on 12/20 for chest tube placement for drainage of recurrent left pleural effusion.  Chest tube placed with drainage of 1270 of bloody fluid with improvement in chest x-ray.  Chest tube was removed on 12/23 and she was discharged with increased dose of spironolactone to 100 and Lasix was continued at 20  Outpatient Encounter Medications as of 07/31/2021  Medication Sig   clobetasol ointment (TEMOVATE) 0.05 % Apply 1 application topically 2 (two) times daily as needed (psoriasis- bilateral legs).   furosemide (LASIX) 20 MG tablet Take 20 mg by mouth every morning.   lidocaine (XYLOCAINE) 2 % solution Use as directed 5 mLs in the mouth or throat every 3 (three) hours as needed (tooth pain).   LORazepam (ATIVAN) 0.5 MG tablet Take 1/2 tablet (0.25 mg) by mouth 30 minutes prior to thoracentesis procedure. If no relief, can take other 1/2 tab (0.25mg ) by mouth. Do not drive after taking. (Patient taking differently: Take 0.25 mg by mouth See admin instructions. Take 1/2 tablet (0.25 mg) by mouth 30 minutes prior to thoracentesis procedure. If no relief, can take other 1/2 tab (0.25mg ) by mouth. Do not drive after taking.)    Potassium Chloride ER 20 MEQ TBCR Take 20 mEq by mouth every morning.   spironolactone (ALDACTONE) 100 MG tablet Take 1 tablet (100 mg total) by mouth daily.   triamcinolone cream (KENALOG) 0.1 % Apply 1 application topically daily as needed (psoriasis- bilateral legs).   TYLENOL CHILDRENS CHEWABLES 160 MG CHEW Chew 80 mg by mouth daily as needed (for headaches).   No facility-administered encounter medications on file as of 07/31/2021.    Physical Exam: There were no vitals taken for this visit. Gen:      No acute distress HEENT:  EOMI, sclera anicteric Neck:     No masses; no thyromegaly Lungs:    Diminished breath sounds on the left CV:         Regular rate and rhythm; no murmurs Abd:      + bowel sounds; soft, non-tender; no palpable masses, no distension Ext:    No edema; adequate peripheral perfusion Skin:      Warm and dry; no rash Neuro: alert and oriented x 3 Psych: normal mood and affect  Data Reviewed: Imaging: CT chest 06/18/2021-large left pleural effusion remains with associated atelectasis of the left lower lobe.  There is subsegmental atelectasis in the left upper lobe.  Chest x-ray 07/31/2021-resolution of pleural effusion  I have reviewed the images personally.  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  Lymphocyte predominance is also concerning There is no objective evidence of infection and multiple fluid cytologies x 4 have been negative for malignancy  Continue Lasix and spironolactone.  She is on potassium supplementation She is due for labs at her  primary care in 1 week when electrolytes will be checked Repeat chest x-ray today  Plan/Recommendations: Chest x-ray  Chilton Greathouse MD [facility] 07/31/2021, 11:28 AM  CC: Malka So., MD

## 2021-08-17 ENCOUNTER — Other Ambulatory Visit: Payer: Self-pay

## 2021-08-17 ENCOUNTER — Ambulatory Visit (HOSPITAL_BASED_OUTPATIENT_CLINIC_OR_DEPARTMENT_OTHER)
Admission: RE | Admit: 2021-08-17 | Discharge: 2021-08-17 | Disposition: A | Discharge status: Home / Self Care | Payer: Managed Care, Other (non HMO) | Source: Ambulatory Visit | Attending: Orthopaedic Surgery | Admitting: Orthopaedic Surgery

## 2021-08-17 ENCOUNTER — Ambulatory Visit (HOSPITAL_BASED_OUTPATIENT_CLINIC_OR_DEPARTMENT_OTHER): Payer: Managed Care, Other (non HMO) | Admitting: Orthopaedic Surgery

## 2021-08-17 ENCOUNTER — Other Ambulatory Visit (HOSPITAL_BASED_OUTPATIENT_CLINIC_OR_DEPARTMENT_OTHER): Payer: Self-pay | Admitting: Orthopaedic Surgery

## 2021-08-17 DIAGNOSIS — M7582 Other shoulder lesions, left shoulder: Secondary | ICD-10-CM | POA: Diagnosis not present

## 2021-08-17 DIAGNOSIS — M25512 Pain in left shoulder: Secondary | ICD-10-CM | POA: Diagnosis not present

## 2021-08-17 NOTE — Progress Notes (Signed)
Chief Complaint: Left shoulder pain     History of Present Illness:    Rebecca Bonilla is a 46 y.o. female right dominant female presents with left shoulder pain now going on for approximately 2 and half weeks.  She states that she did feel a previous pop around this time just during normal activity and subsequently has had limited overhead activity and motion.  She works as an Pensions consultant.  She is not able to swim which she enjoys doing for exercise.  She has been using an over-the-counter brace which helps.  She is taking Tylenol with little relief.    Surgical History:   None  PMH/PSH/Family History/Social History/Meds/Allergies:    Past Medical History:  Diagnosis Date   Alcohol abuse    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   Hyponatremia 08/15/2020   Macrocytosis 10/29/2020   Portal hypertension (HCC) 08/14/2020   Past Surgical History:  Procedure Laterality Date   ANKLE SURGERY     x 2   CHEST TUBE INSERTION Left 06/27/2021   Procedure: CHEST TUBE INSERTION;  Surgeon: Tomma Lightning, MD;  Location: [facility] ENDOSCOPY;  Service: Pulmonary;  Laterality: Left;   THORACENTESIS N/A 06/18/2021   Procedure: Alanson Puls;  Surgeon: [name], MD;  Location: [facility] ENDOSCOPY;  Service: Pulmonary;  Laterality: N/A;   Social History   Socioeconomic History   Marital status: Married    Spouse name: Not on file   Number of children: Not on file   Years of education: Not on file   Highest education level: Not on file  Occupational History   Not on file  Tobacco Use   Smoking status: Former    Packs/day: 0.50    Years: 10.00    Pack years: 5.00    Types: Cigarettes   Smokeless tobacco: Never  Vaping Use   Vaping Use: Former  Substance and Sexual Activity   Alcohol use: Not Currently   Drug use: Never   Sexual activity: Not on file  Other Topics Concern   Not on file  Social History Narrative   Not on file   Social  Determinants of Health   Financial Resource Strain: Not on file  Food Insecurity: Not on file  Transportation Needs: Not on file  Physical Activity: Not on file  Stress: Not on file  Social Connections: Not on file   Family History  Problem Relation Age of Onset   Huntington's disease Mother    Allergies  Allergen Reactions   Clemastine Rash and Other (See Comments)    Tavist     Hydrocodone Nausea And Vomiting   Other Rash and Other (See Comments)    Albertson's Dayhist-D/generic of Tavist- (reaction to antihistamine that is no longer on the market)   Oxycodone Nausea Only and Other (See Comments)    Nausea to opioids     Current Outpatient Medications  Medication Sig Dispense Refill   clobetasol ointment (TEMOVATE) 0.05 % Apply 1 application topically 2 (two) times daily as needed (psoriasis- bilateral legs).     furosemide (LASIX) 20 MG tablet Take 20 mg by mouth every morning.     lidocaine (XYLOCAINE) 2 % solution Use as directed 5 mLs in the mouth or throat every 3 (three) hours as needed (tooth pain).     LORazepam (ATIVAN)  0.5 MG tablet Take 1/2 tablet (0.25 mg) by mouth 30 minutes prior to thoracentesis procedure. If no relief, can take other 1/2 tab (0.25mg ) by mouth. Do not drive after taking. (Patient taking differently: Take 0.25 mg by mouth See admin instructions. Take 1/2 tablet (0.25 mg) by mouth 30 minutes prior to thoracentesis procedure. If no relief, can take other 1/2 tab (0.25mg ) by mouth. Do not drive after taking.) 1 tablet 0   Potassium Chloride ER 20 MEQ TBCR Take 20 mEq by mouth every morning.     spironolactone (ALDACTONE) 100 MG tablet Take 1 tablet (100 mg total) by mouth daily. 30 tablet 3   triamcinolone cream (KENALOG) 0.1 % Apply 1 application topically daily as needed (psoriasis- bilateral legs).     TYLENOL CHILDRENS CHEWABLES 160 MG CHEW Chew 80 mg by mouth daily as needed (for headaches).     No current facility-administered medications for  this visit.   No results found.  Review of Systems:   A ROS was performed including pertinent positives and negatives as documented in the HPI.  Physical Exam :   Constitutional: NAD and appears stated age Neurological: Alert and oriented Psych: Appropriate affect and cooperative There were no vitals taken for this visit.   Comprehensive Musculoskeletal Exam:    Musculoskeletal Exam    Inspection Right Left  Skin No atrophy or winging No atrophy or winging  Palpation    Tenderness None Greater tuberosity  Range of Motion    Flexion (passive) 170 150 with pain  Flexion (active) 170 100  Abduction 170 100  ER at the side 70 55 with pain  Can reach behind back to T12 L5  Strength     Full Weakness in supraspinatus and infraspinatus, negative belly press  Special Tests    Pseudoparalytic No No  Neurologic    Fires PIN, radial, median, ulnar, musculocutaneous, axillary, suprascapular, long thoracic, and spinal accessory innervated muscles. No abnormal sensibility  Vascular/Lymphatic    Radial Pulse 2+ 2+  Cervical Exam    Patient has symmetric cervical range of motion with negative Spurling's test.  Special Test:      Imaging:   Xray (3 views left shoulder): Normal  I personally reviewed and interpreted the radiographs.   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  Given her limited range of motion overhead activity I would like to order an MRI to rule out any type of acute rotator cuff tear that would need to be potentially intervened upon.  I have specifically given her some home exercises that she can work on in the interim.  She will see her back after MRI so we can discuss results and neck steps in terms of treatment plan  Plan :    -Plan for MRI left shoulder follow-up to discuss results after   I believe that advance imaging in  the form of an MRI is indicated for the following reasons: -Xrays images were obtained and not diagnostic -The patient has failed treatment modalities including rest, activity restriction, bracing, Tylenol -The following worrisome symptoms are present on history and exam: Acute weakness with active limitation in motion after an injury      I personally saw and evaluated the patient, and participated in the management and treatment plan.  Huel Cote, MD Attending Physician, Orthopedic Surgery  This document was dictated using Dragon voice recognition software.  A reasonable attempt at proof reading has been made to minimize errors.

## 2021-08-20 ENCOUNTER — Encounter (HOSPITAL_BASED_OUTPATIENT_CLINIC_OR_DEPARTMENT_OTHER): Payer: Self-pay | Admitting: Orthopaedic Surgery

## 2021-08-23 ENCOUNTER — Encounter: Payer: Self-pay | Admitting: Obstetrics & Gynecology

## 2021-08-23 ENCOUNTER — Other Ambulatory Visit: Payer: Self-pay

## 2021-08-23 ENCOUNTER — Ambulatory Visit (INDEPENDENT_AMBULATORY_CARE_PROVIDER_SITE_OTHER): Payer: Managed Care, Other (non HMO) | Admitting: Obstetrics & Gynecology

## 2021-08-23 ENCOUNTER — Other Ambulatory Visit (HOSPITAL_COMMUNITY)
Admission: RE | Admit: 2021-08-23 | Discharge: 2021-08-23 | Disposition: A | Discharge status: Home / Self Care | Payer: Managed Care, Other (non HMO) | Source: Ambulatory Visit | Attending: Obstetrics & Gynecology | Admitting: Obstetrics & Gynecology

## 2021-08-23 VITALS — BP 130/74 | HR 105 | Resp 16 | Ht 68.0 in | Wt 174.0 lb

## 2021-08-23 DIAGNOSIS — Z01419 Encounter for gynecological examination (general) (routine) without abnormal findings: Secondary | ICD-10-CM | POA: Diagnosis not present

## 2021-08-23 DIAGNOSIS — N912 Amenorrhea, unspecified: Secondary | ICD-10-CM | POA: Diagnosis not present

## 2021-08-23 DIAGNOSIS — L732 Hidradenitis suppurativa: Secondary | ICD-10-CM

## 2021-08-23 MED ORDER — CLINDAMYCIN PHOSPHATE 1 % EX GEL: Freq: Two times a day (BID) | CUTANEOUS | 11 refills | Status: DC

## 2021-08-23 NOTE — Progress Notes (Signed)
Last Mammogram: 6/22 Last Pap Smear:  2013 Last Colon Screening;  6/22 Seat Belts:   yes Sun Screen:   yes Dental Check Up:  2023 Brush & Floss:  Every day sometimes 3 x   Subjective:     Rebecca Bonilla is a 46 y.o. female here for a routine exam.  Current complaints: skin bumps in the folds.  Married and sexually active.   Gynecologic History No LMP recorded. (Menstrual status: Perimenopausal). Contraception: none Last Pap: 2013. Results were: normal Last mammogram: 6/22. Results were: normal  Obstetric History OB History  Gravida Para Term Preterm AB Living  1       1    SAB IAB Ectopic Multiple Live Births               # Outcome Date GA Lbr Len/2nd Weight Sex Delivery Anes PTL Lv  1 AB              The following portions of the patient's history were reviewed and updated as appropriate: allergies, current medications, past family history, past medical history, past social history, past surgical history, and problem list.  Review of Systems Pertinent items noted in HPI and remainder of comprehensive ROS otherwise negative.    Objective:     Vitals:   08/23/21 0902  BP: 130/74  Pulse: (!) 105  Resp: 16  Weight: 174 lb (78.9 kg)  Height: 5\' 8"  (1.727 m)   Vitals:  WNL General appearance: alert, cooperative and no distress  HEENT: Normocephalic, without obvious abnormality, atraumatic Throat: lips, mucosa, and tongue normal; teeth and gums normal  Respiratory: Clear to auscultation bilaterally  CV: Regular rate and rhythm  Breasts:  Normal appearance, no masses or tenderness, no nipple retraction or dimpling  GI: Soft, non-tender; bowel sounds normal; no masses,  no organomegaly  GU: External Genitalia:  Tanner V, no lesion Urethra:  No prolapse   Vagina: Pink, normal rugae, no blood or discharge  Cervix: No CMT, no lesion  Uterus:  Normal size and contour, non tender  Adnexa: Normal, no masses, non tender  Musculoskeletal: No edema, redness or  tenderness in the calves or thighs; shoulder has restricted motion (sees ortho)  Skin: HS (sweat glands) in axilla and inguinal area; one on abdomen; slightly jaundiced  Lymphatic: Axillary adenopathy: none     Psychiatric: Normal mood and behavior        Assessment:    Healthy female exam.    Plan:    Pap with cotesting Yearly mammograms PCP to manage rest of health maintenance Amenorrhea--check [facility]-->topical cleocin gel bid RTC 1 year or sooner if [facility] is low and not in menopause.

## 2021-08-24 LAB — CYTOLOGY - PAP
Comment: NEGATIVE
Diagnosis: NEGATIVE
High risk HPV: NEGATIVE

## 2021-08-29 LAB — FOLLICLE STIMULATING HORMONE: FSH: 5.2 m[IU]/mL

## 2021-08-30 ENCOUNTER — Ambulatory Visit (HOSPITAL_BASED_OUTPATIENT_CLINIC_OR_DEPARTMENT_OTHER): Payer: Managed Care, Other (non HMO) | Admitting: Orthopaedic Surgery

## 2021-08-30 ENCOUNTER — Encounter: Payer: Self-pay | Admitting: Obstetrics & Gynecology

## 2021-08-30 ENCOUNTER — Other Ambulatory Visit: Payer: Self-pay | Admitting: Obstetrics & Gynecology

## 2021-08-30 MED ORDER — MEDROXYPROGESTERONE ACETATE 10 MG PO TABS: 10.0000 mg | ORAL_TABLET | Freq: Every day | ORAL | 0 refills | Status: DC

## 2021-08-31 ENCOUNTER — Ambulatory Visit (HOSPITAL_BASED_OUTPATIENT_CLINIC_OR_DEPARTMENT_OTHER): Payer: Managed Care, Other (non HMO) | Admitting: Orthopaedic Surgery

## 2021-09-06 DIAGNOSIS — K089 Disorder of teeth and supporting structures, unspecified: Secondary | ICD-10-CM | POA: Insufficient documentation

## 2021-09-11 ENCOUNTER — Telehealth: Payer: Self-pay | Admitting: *Deleted

## 2021-09-11 NOTE — Telephone Encounter (Signed)
Left patient a message to call and schedule per Dr. Gala Romney. ?

## 2021-09-11 NOTE — Telephone Encounter (Signed)
-----   Message from Guss Bunde, MD sent at 09/11/2021  9:55 AM EST ----- ?Hi, ?Can you schedule her with a video visit with MD to discuss test results and her amenorrhea. ? ?Thanks, ? ?[facility] ? ?

## 2021-09-20 ENCOUNTER — Other Ambulatory Visit: Payer: Self-pay

## 2021-09-20 ENCOUNTER — Ambulatory Visit (HOSPITAL_BASED_OUTPATIENT_CLINIC_OR_DEPARTMENT_OTHER): Payer: Managed Care, Other (non HMO) | Admitting: Orthopaedic Surgery

## 2021-09-20 DIAGNOSIS — M7582 Other shoulder lesions, left shoulder: Secondary | ICD-10-CM

## 2021-09-20 NOTE — Progress Notes (Signed)
? ?                            ? ? ?Chief Complaint: Left shoulder pain ?  ? ? ?History of Present Illness:  ? ?09/20/2021: Presents today for further assessment of her left shoulder.  She has now been at 6 weeks of using her sling for brace as well as resting left shoulder.  There are is persistent weakness and pain in the left shoulder.  She has been working on a home exercise program including using a pulley and active range of motion.  That being said she is still quite limited in having pain by the lateral aspect of the shoulder.  Here today for further assessment. ? ?Rebecca FrameJennifer Katherine Bonilla is a 46 y.o. female right dominant female presents with left shoulder pain now going on for approximately 2 and half weeks.  She states that she did feel a previous pop around this time just during normal activity and subsequently has had limited overhead activity and motion.  She works as an Pensions consultantattorney.  She is not able to swim which she enjoys doing for exercise.  She has been using an over-the-counter brace which helps.  She is taking Tylenol with little relief. ? ? ? ?Surgical History:   ?None ? ?PMH/PSH/Family History/Social History/Meds/Allergies:   ? ?Past Medical History:  ?Diagnosis Date  ? Alcohol abuse   ? 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  ? Hyponatremia 08/15/2020  ? Macrocytosis 10/29/2020  ? Portal hypertension (HCC) 08/14/2020  ? Rotator cuff disorder   ? ?Past Surgical History:  ?Procedure Laterality Date  ? ANKLE SURGERY    ? x 2  ? CHEST TUBE INSERTION Left 06/27/2021  ? Procedure: CHEST TUBE INSERTION;  Surgeon: Tomma Lightninglalere, Adewale A, MD;  Location: [facility] ENDOSCOPY;  Service: Pulmonary;  Laterality: Left;  ? PARACENTESIS    ? THORACENTESIS N/A 06/18/2021  ? Procedure: THORACENTESIS;  Surgeon: Martina Sinnerewald, Jonathan B, MD;  [facility];  Service: Pulmonary;  Laterality: N/A;  ? ?Social History  ? ?Socioeconomic History  ? Marital status: Married  ?  Spouse name: Not on file  ? Number of  children: Not on file  ? Years of education: Not on file  ? Highest education level: Not on file  ?Occupational History  ? Not on file  ?Tobacco Use  ? Smoking status: Former  ?  Packs/day: 0.50  ?  Years: 10.00  ?  Pack years: 5.00  ?  Types: Cigarettes  ? Smokeless tobacco: Never  ?Vaping Use  ? Vaping Use: Former  ?Substance and Sexual Activity  ? Alcohol use: Not Currently  ? Drug use: Never  ? Sexual activity: Yes  ?  Birth control/protection: Post-menopausal  ?Other Topics Concern  ? Not on file  ?Social History Narrative  ? Not on file  ? ?Social Determinants of Health  ? ?Financial Resource Strain: Not on file  ?Food Insecurity: Not on file  ?Transportation Needs: Not on file  ?Physical Activity: Not on file  ?Stress: Not on file  ?Social Connections: Not on file  ? ?Family History  ?Problem Relation Age of Onset  ? Pancreatic cancer Paternal Grandfather   ? Skin cancer Father   ? Huntington's disease Mother   ? ?Allergies  ?Allergen Reactions  ? Clemastine Rash and Other (See Comments)  ?  Tavist ? ?  ? Hydrocodone Nausea And Vomiting  ? Other Rash and Other (See  Comments)  ?  Albertson's Dayhist-D/generic of Tavist- (reaction to antihistamine that is no longer on the market)  ? Oxycodone Nausea Only and Other (See Comments)  ?  Nausea to opioids ? ?  ? ?Current Outpatient Medications  ?Medication Sig Dispense Refill  ? clindamycin (CLINDAGEL) 1 % gel Apply topically 2 (two) times daily. 30 g 11  ? clobetasol ointment (TEMOVATE) 0.05 % Apply 1 application topically 2 (two) times daily as needed (psoriasis- bilateral legs).    ? furosemide (LASIX) 20 MG tablet Take 20 mg by mouth every morning.    ? lidocaine (XYLOCAINE) 2 % solution Use as directed 5 mLs in the mouth or throat every 3 (three) hours as needed (tooth pain).    ? medroxyPROGESTERone (PROVERA) 10 MG tablet Take 1 tablet (10 mg total) by mouth daily. Use for ten days 10 tablet 0  ? Potassium Chloride ER 20 MEQ TBCR Take 20 mEq by mouth every  morning.    ? spironolactone (ALDACTONE) 100 MG tablet Take 1 tablet (100 mg total) by mouth daily. 30 tablet 3  ? triamcinolone cream (KENALOG) 0.1 % Apply 1 application topically daily as needed (psoriasis- bilateral legs).    ? TYLENOL CHILDRENS CHEWABLES 160 MG CHEW Chew 80 mg by mouth daily as needed (for headaches).    ? ?No current facility-administered medications for this visit.  ? ?No results found. ? ?Review of Systems:   ?A ROS was performed including pertinent positives and negatives as documented in the HPI. ? ?Physical Exam :   ?Constitutional: NAD and appears stated age ?Neurological: Alert and oriented ?Psych: Appropriate affect and cooperative ?There were no vitals taken for this visit.  ? ?Comprehensive Musculoskeletal Exam:   ? ?Musculoskeletal Exam    ?Inspection Right Left  ?Skin No atrophy or winging No atrophy or winging  ?Palpation    ?Tenderness None Greater tuberosity  ?Range of Motion    ?Flexion (passive) 170 150 with pain  ?Flexion (active) 170 100  ?Abduction 170 100  ?ER at the side 70 55 with pain  ?Can reach behind back to T12 L5  ?Strength    ? Full Weakness in supraspinatus and infraspinatus, negative belly press  ?Special Tests    ?Pseudoparalytic No No  ?Neurologic    ?Fires PIN, radial, median, ulnar, musculocutaneous, axillary, suprascapular, long thoracic, and spinal accessory innervated muscles. No abnormal sensibility  ?Vascular/Lymphatic    ?Radial Pulse 2+ 2+  ?Cervical Exam    ?Patient has symmetric cervical range of motion with negative Spurling's test.  ?Special Test:   ? ? ? ?Imaging:   ?Xray (3 views left shoulder): ?Normal ? ?I personally reviewed and interpreted the radiographs. ? ? 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  At this time she continues to have quite limited strength which I am specifically concerned about.  We did  discuss that there is concern for possible acute rotator cuff injury.  Given the fact that she has failed 6 weeks of conservative management with a home exercise program as well as bracing in a sling I do believe that an MRI is indicated in order to further assess the shoulder ? ?Plan :   ? ?-Plan for MRI left shoulder follow-up to discuss results after ? ? ?I believe that advance imaging in the form of an MRI is indicated for the following reasons: ?-Xrays images were obtained  and not diagnostic ?-The patient has failed treatment modalities including rest, activity restriction for 6 weeks, bracing, Tylenol ?-The following worrisome symptoms are present on history and exam: Acute weakness with active limitation in motion after an injury ? ? ? ? ? ?I personally saw and evaluated the patient, and participated in the management and treatment plan. ? ?Huel Cote, MD ?Attending Physician, Orthopedic Surgery ? ?This document was dictated using Conservation officer, historic buildings. A reasonable attempt at proof reading has been made to minimize errors. ?

## 2021-09-24 NOTE — Progress Notes (Signed)
? ? ?GYNECOLOGY VIRTUAL VISIT ENCOUNTER NOTE ? ?Provider location: [facility] for [employer] [place]  ? ?Patient location: Home ? ?I connected with Rebecca Bonilla on 09/27/21 at  3:30 PM EDT by MyChart Video Encounter and verified that I am speaking with the correct person using two identifiers. ?  ?I discussed the limitations, risks, security and privacy concerns of performing an evaluation and management service virtually and the availability of in person appointments. I also discussed with the patient that there may be a patient responsible charge related to this service. The patient expressed understanding and agreed to proceed. ?  ?History:  ?Rebecca Bonilla is a 46 y.o. G1P0010 female being evaluated today for amenorrhea and to discuss her lab results.  ? ?Her FSH on 08/2021 was normal.  ?HCG in 06/2021 was negative.  ?TSH 08/2020 was normal (1.17) ? ?Her last period was likely February 2022 when she was hospitalized. During that time she had hot flashes but hasn't had them recently.  ? ?She does have significant liver disease and is undergoing evaluation for transplant through [facility].  ?  ?  ?Past Medical History:  ?Diagnosis Date  ? Alcohol abuse   ? 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  ? Hyponatremia 08/15/2020  ? Macrocytosis 10/29/2020  ? Portal hypertension (HCC) 08/14/2020  ? Rotator cuff disorder   ? ?Past Surgical History:  ?Procedure Laterality Date  ? ANKLE SURGERY    ? x 2  ? CHEST TUBE INSERTION Left 06/27/2021  ? Procedure: CHEST TUBE INSERTION;  Surgeon: Tomma Lightning, MD;  Location: [facility] ENDOSCOPY;  Service: Pulmonary;  Laterality: Left;  ? PARACENTESIS    ? THORACENTESIS N/A 06/18/2021  ? Procedure: THORACENTESIS;  Surgeon: [name], MD;  Location: [facility] ENDOSCOPY;  Service: Pulmonary;  Laterality: N/A;  ? ?The following portions of the patient's history were reviewed and updated as appropriate: allergies, current medications, past family  history, past medical history, past social history, past surgical history and problem list.  ? ?Health Maintenance:  Normal pap and negative HRHPV on 08/2021.  Normal mammogram work up in 12/2020.  ? ?Review of Systems:  ?Pertinent items noted in HPI and remainder of comprehensive ROS otherwise negative. ? ?Physical Exam:  ? ?General:  Alert, oriented and cooperative. Patient appears to be in no acute distress.  ?Mental Status: Normal mood and affect. Normal behavior. Normal judgment and thought content.   ?Respiratory: Normal respiratory effort, no problems with respiration noted  ?Rest of physical exam deferred due to type of encounter ? ?Labs and Imaging ?No results found for this or any previous visit (from the past 336 hour(s)). ?No results found.   ?  ?Assessment and Plan:  ?   ?1. Amenorrhea ?- she has held on the Provera due to risks with it and liver disease ?- Reviewed possible she is perimenopausal vs postmenopausal although FSH was normal. Discussed also possible suppression due to liver disease and other chronic medical conditions I.e. hypothalamic amenorrhea.  ?- At this point, she will continue to observe and if she has any amount of bleeding, including pink spotting, she will let us know and we will check an [place]. I would also check a panel of hormones at that time too: FSH, LH, E2, and progesterone to aid in diagnosis of hypothalamic vs other etiology.    ?  ?I discussed the assessment and treatment plan with the patient. The patient was provided an opportunity to ask questions and all were answered.  The patient agreed with the plan and demonstrated an understanding of the instructions. ?  ? ? ?I provided 8 minutes of face-to-face time during this encounter. ? ? ?Milas Hock, MD ?Center for [facility], [facility] ? ?

## 2021-09-27 ENCOUNTER — Telehealth: Payer: Self-pay | Admitting: *Deleted

## 2021-09-27 ENCOUNTER — Encounter: Payer: Self-pay | Admitting: Obstetrics and Gynecology

## 2021-09-27 ENCOUNTER — Telehealth (INDEPENDENT_AMBULATORY_CARE_PROVIDER_SITE_OTHER): Payer: Managed Care, Other (non HMO) | Admitting: Obstetrics and Gynecology

## 2021-09-27 DIAGNOSIS — N912 Amenorrhea, unspecified: Secondary | ICD-10-CM

## 2021-09-27 NOTE — Telephone Encounter (Signed)
Left patient an urgent message to pay co-pay of $30.00 that is attached to her MyChart video visit at 3:30 PM when she checks in for the appointment. Call the office if needed. ?

## 2021-10-11 ENCOUNTER — Ambulatory Visit (HOSPITAL_BASED_OUTPATIENT_CLINIC_OR_DEPARTMENT_OTHER): Payer: Managed Care, Other (non HMO) | Attending: Orthopaedic Surgery | Admitting: Physical Therapy

## 2021-10-11 DIAGNOSIS — M6281 Muscle weakness (generalized): Secondary | ICD-10-CM | POA: Diagnosis not present

## 2021-10-11 DIAGNOSIS — M25512 Pain in left shoulder: Secondary | ICD-10-CM | POA: Insufficient documentation

## 2021-10-11 DIAGNOSIS — M25612 Stiffness of left shoulder, not elsewhere classified: Secondary | ICD-10-CM | POA: Diagnosis not present

## 2021-10-11 DIAGNOSIS — M7582 Other shoulder lesions, left shoulder: Secondary | ICD-10-CM | POA: Diagnosis present

## 2021-10-11 NOTE — Therapy (Signed)
?OUTPATIENT PHYSICAL THERAPY SHOULDER EVALUATION ? ? ?Patient Name: Rebecca Bonilla ?MRN: [id number] ?DOB:01/04/1976, 46 y.o., female ?Today's Date: 10/12/2021 ? ? PT End of Session - 10/11/21 1621   ? ? Visit Number 1   ? Number of Visits 12   ? Date for PT Re-Evaluation 11/22/21   ? Authorization Type Cigna   ? PT Start Time 1522   ? PT Stop Time 1610   ? PT Time Calculation (min) 48 min   ? Activity Tolerance Patient tolerated treatment well   ? Behavior During Therapy [facility] for tasks assessed/performed   ? ?  ?  ? ?  ? ? ?Past Medical History:  ?Diagnosis Date  ? Alcohol abuse   ? Cirrhosis of liver with ascites ([place]) 08/14/2020  ? Hyponatremia 08/15/2020  ? Macrocytosis 10/29/2020  ? Portal hypertension ([place]) 08/14/2020  ? Rotator cuff disorder   ? ?Past Surgical History:  ?Procedure Laterality Date  ? ANKLE SURGERY    ? x 2  ? CHEST TUBE INSERTION Left 06/27/2021  ? Procedure: CHEST TUBE INSERTION;  Surgeon: Laurin Coder, MD;  Location: [facility] ENDOSCOPY;  Service: Pulmonary;  Laterality: Left;  ? PARACENTESIS    ? THORACENTESIS N/A 06/18/2021  ? Procedure: THORACENTESIS;  Surgeon: Freddi Starr, MD;  Location: [facility] ENDOSCOPY;  Service: Pulmonary;  Laterality: N/A;  ? ?Patient Active Problem List  ? Diagnosis Date Noted  ? 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 Recurrent left pleural effusion 06/26/2021  ? Flank pain 06/09/2021  ? Hematuria 06/09/2021  ? Ascites 04/02/2021  ? Secondary esophageal varices without bleeding ([place]) 12/19/2020  ? History of colon polyps 12/19/2020  ? SOB (shortness of breath) 10/30/2020  ? Pleural effusion 10/29/2020  ? Macrocytosis 10/29/2020  ? Hypokalemia 10/29/2020  ? Hypophosphatemia 10/29/2020  ? Hypomagnesemia 10/29/2020  ? Leukocytosis 10/12/2020  ? Thrombocytopenia ([place]) 10/12/2020  ? Anemia, chronic disease 09/20/2020  ? Alcoholism ([place]) 08/25/2020  ? Hyponatremia 08/15/2020  ? Cirrhosis of liver with ascites ([place]) 08/14/2020  ? Portal hypertension ([place]) 08/14/2020  ?  Alcoholic cirrhosis of liver with ascites ([place]) 08/14/2020  ? Gallbladder sludge 08/14/2020  ? Psoriasis 08/09/2020  ? ? ?PCP: Lilian Coma., MD ? ?REFERRING PROVIDER: Vanetta Mulders, MD ? ?REFERRING DIAG: M75.82 (ICD-10-CM) - Rotator cuff tendinitis, left  ? ?THERAPY DIAG:  ?Left shoulder pain, unspecified chronicity ? ?Muscle weakness (generalized) ? ?Stiffness of left shoulder, not elsewhere classified ? ? ?ONSET DATE: End of January 2023 ? ?SUBJECTIVE:                                                                                                                                                                                     ? ?  SUBJECTIVE STATEMENT: ?Pt is a member here at [employer].  Pt loves swimming.  She was swimming freestyle in late January and felt a pop having immediate pain.  Pt saw Dr. Sammuel Hines and had x rays.  X rays were negative and MD ordered a MRI.  MD had concerns of an acute RTC injury.  Pt reports MRI was denied by insurance.  Pt used a brace and a sling and performed exercises from MD which were wall walks AAROM and shoulder pulleys.  Pt continued to have the same sx's and returned to MD on 09/20/2021.  MD again ordered a MRI and also ordered PT.  Pt states she has learned how not to use or move her shoulder and reports her shoulder is marginally better.  MD order indicated L shoulder RC program for strengthening   ? ?Pt favors her R UE and her R UE is getting sore due to increased activity of R UE.  Pt has increased pain and difficulty with performing hair care and dressing.  Pt is unable to lift objects with L UE.  Pt is severely limited with reaching with L UE.  She has been swimming just using R UE.  Pt does not carry objects with L UE into and out of the courtroom.    ? ? ?PERTINENT HISTORY: ?possible RTC injury, waiting for MRI; Cirrhosis of liver  ? ? ? ?PAIN:  ?Are you having pain? Yes ?NPRS:  0/10 current at rest, 7/10 worst  ?Location:  L anterior shoulder ?Type:  sharp   ? ?PRECAUTIONS: Other: possible RTC injury ? ?WEIGHT BEARING RESTRICTIONS No ? ?FALLS:  ?Has patient fallen in last 6 months? No ? ? ? 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   ? ?PLOF: Independent.  Pt was able to perform all of her ADLs/IADLs, reaching activities, overhead activities without limitation or pain.  Pt was able to perform her normal work and lifting activities without limitation or pain.  ? ?PATIENT GOALS return to swimming normally, independently change her shirt, increased activities in the gym. ? ?OBJECTIVE:  ? ?DIAGNOSTIC FINDINGS:  ?X rays:  negative ? ? ?Pt is R hand dominant ? ? ?PATIENT SURVEYS:  ?FOTO 44 with a goal of 68 at visit #13. ? ?COGNITION: ? Overall cognitive status: Within functional limits for tasks assessed ?    ? ?UPPER EXTREMITY ROM:  ? ?AROM/PROM Right ?10/12/2021 Left ?10/12/2021  ?Shoulder flexion 156 75-80 with pain/154  ?Shoulder scaption 154 142  ?Shoulder abduction 138 138  ?Shoulder adduction    ?Shoulder ER 93 51/68 with pain  ?Shoulder IR 67 at 90 deg abd 75 at approx 60 deg  ?Elbow flexion    ?Elbow extension    ?Wrist flexion    ?Wrist extension    ?Wrist ulnar deviation    ?Wrist radial deviation    ?Wrist pronation    ?Wrist supination    ?(Blank rows = not tested) ? ?UPPER EXTREMITY MMT: ? ?MMT Right ?10/12/2021 Left ?10/12/2021  ?Shoulder flexion 5/5   ?Shoulder scaption 5/5 Weak; Tolerated min to mod resistance  ?Shoulder abduction 5/5 Unable to tolerate resistance  ?Shoulder adduction    ?Shoulder internal rotation 4+/5 Tol minimal resistance  ?Shoulder external rotation 5/5 Unable to tolerate resistance  ?Middle trapezius    ?Lower trapezius    ?Elbow flexion    ?Elbow extension    ?Wrist flexion    ?Wrist extension    ?Wrist ulnar deviation    ?Wrist radial deviation    ?Wrist pronation    ?  Wrist supination    ?Grip strength (lbs)    ?(Blank rows = not tested) ? ?SHOULDER SPECIAL TESTS: ? Rotator cuff assessment: Lift off test:  negative bilat ; ER Lag Test: negative  bilat ?  ? ?PALPATION:  ?Pt had no tenderness t/o L shoulder except some tenderness at supraspinatus. ?  ?TODAY'S TREATMENT:  ?See below for pt education. ? ? ?PATIENT EDUCATION: ?Education details: Used pictures on the internet to educate pt concerning RTC and shoulder anatomy.  Educated pt concerning function and purpose of RTC.  Dx, POC, rationale of exercises, and objective findings.  PT answered Pt's questions.  ?Person educated: Patient ?Education method: Customer service manager and pictures on internet ?Education comprehension: verbalized understanding ? ? ?HOME EXERCISE PROGRAM: ?Will give next visit. ? ?ASSESSMENT: ? ?CLINICAL IMPRESSION: ?Patient is a 46 y.o. female with a dx of L RTC tendinitis presenting to the clinic with L shoulder pain, muscle weakness in L shoulder, and limited ROM in L shoulder.  MD notes indicated possible acute RTC injury.  MD has ordered a MRI and pt is waiting to be scheduled.  Pt favors her R UE with daily activities.  Pt states her L shoulder may be marginally better, but she is not using her L shoulder much.  Pt has increased pain and difficulty with performing self care activities and ADLs.  She is severely limited with reaching with L UE and is unable to lift objects with L UE.  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  She has been swimming just using R UE.  Pt has good scaption and abduction AROM though is very limited in flexion AROM and also limited in ER.  Pt should benefit from skilled PT services to address impairments and improve overall function.  ? ? ?OBJECTIVE IMPAIRMENTS decreased activity tolerance, decreased ROM, decreased strength, hypomobility, impaired flexibility, impaired UE functional use, and pain.  ? ?ACTIVITY LIMITATIONS cleaning, occupation, laundry, and lifting objects, dressing, and swimming .  ? ? ? ?REHAB POTENTIAL: Good ? ?CLINICAL DECISION MAKING: Stable/uncomplicated ? ?EVALUATION  COMPLEXITY: Low ? ? ?GOALS: ? ?SHORT TERM GOALS: Target date: 11/01/2021 ? ?Pt will be independent and compliant with HEP for improved pain, strength, ROM, and function.  ?Baseline: ?Goal status: INITIAL ? ?2.  Pt will report at least a 2

## 2021-10-12 ENCOUNTER — Encounter (HOSPITAL_BASED_OUTPATIENT_CLINIC_OR_DEPARTMENT_OTHER): Payer: Self-pay | Admitting: Physical Therapy

## 2021-10-17 ENCOUNTER — Encounter (HOSPITAL_BASED_OUTPATIENT_CLINIC_OR_DEPARTMENT_OTHER): Payer: Self-pay | Admitting: Physical Therapy

## 2021-10-17 ENCOUNTER — Ambulatory Visit (HOSPITAL_BASED_OUTPATIENT_CLINIC_OR_DEPARTMENT_OTHER): Payer: Managed Care, Other (non HMO) | Admitting: Physical Therapy

## 2021-10-17 DIAGNOSIS — M25612 Stiffness of left shoulder, not elsewhere classified: Secondary | ICD-10-CM

## 2021-10-17 DIAGNOSIS — M25512 Pain in left shoulder: Secondary | ICD-10-CM

## 2021-10-17 DIAGNOSIS — M6281 Muscle weakness (generalized): Secondary | ICD-10-CM

## 2021-10-17 DIAGNOSIS — M7582 Other shoulder lesions, left shoulder: Secondary | ICD-10-CM | POA: Diagnosis not present

## 2021-10-17 NOTE — Therapy (Signed)
?OUTPATIENT PHYSICAL THERAPY SHOULDER TREATMENT ? ? ?Patient Name: Rebecca Bonilla ?MRN: [id number] ?DOB:07/18/1975, 46 y.o., female ?Today's Date: 10/17/2021 ? ? PT End of Session - 10/17/21 1040   ? ? Visit Number 2   ? Number of Visits 12   ? Date for PT Re-Evaluation 11/22/21   ? Authorization Type Cigna   ? PT Start Time 0845   ? PT Stop Time 0925   ? PT Time Calculation (min) 40 min   ? Activity Tolerance Patient tolerated treatment well   ? Behavior [facility] tasks assessed/performed   ? ?  ?  ? ?  ? ? ? ?Past Medical History:  ?Diagnosis Date  ? Alcohol abuse   ? 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  ? Hyponatremia 08/15/2020  ? Macrocytosis 10/29/2020  ? Portal hypertension (HCC) 08/14/2020  ? Rotator cuff disorder   ? ?Past Surgical History:  ?Procedure Laterality Date  ? ANKLE SURGERY    ? x 2  ? CHEST TUBE INSERTION Left 06/27/2021  ? Procedure: CHEST TUBE INSERTION;  Surgeon: Tomma Lightninglalere, Adewale A, MD;  Location: [facility] ENDOSCOPY;  Service: Pulmonary;  Laterality: Left;  ? PARACENTESIS    ? THORACENTESIS N/A 06/18/2021  ? Procedure: THORACENTESIS;  Surgeon: Martina Sinnerewald, Jonathan B, MD;  [facility];  Service: Pulmonary;  Laterality: N/A;  ? ?Patient Active Problem List  ? Diagnosis Date Noted  ? 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 Recurrent left pleural effusion 06/26/2021  ? Flank pain 06/09/2021  ? Hematuria 06/09/2021  ? Ascites 04/02/2021  ? Secondary esophageal varices without bleeding (HCC) 12/19/2020  ? History of colon polyps 12/19/2020  ? SOB (shortness of breath) 10/30/2020  ? Pleural effusion 10/29/2020  ? Macrocytosis 10/29/2020  ? Hypokalemia 10/29/2020  ? Hypophosphatemia 10/29/2020  ? Hypomagnesemia 10/29/2020  ? Leukocytosis 10/12/2020  ? Thrombocytopenia (HCC) 10/12/2020  ? Anemia, chronic disease 09/20/2020  ? Alcoholism (HCC) 08/25/2020  ? Hyponatremia 08/15/2020  ? 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  ? Portal hypertension (HCC) 08/14/2020  ?  Alcoholic cirrhosis of liver with ascites (HCC) 08/14/2020  ? Gallbladder sludge 08/14/2020  ? Psoriasis 08/09/2020  ? ? ?PCP: Malka SoJobe, Daniel B., MD ? ?REFERRING PROVIDER: Malka SoJobe, Daniel B., MD ? ?REFERRING DIAG: M75.82 (ICD-10-CM) - Rotator cuff tendinitis, left  ? ?THERAPY DIAG:  ?Left shoulder pain, unspecified chronicity ? ?Muscle weakness (generalized) ? ?Stiffness of left shoulder, not elsewhere classified ? ? ?ONSET DATE: End of January 2023 ? ?SUBJECTIVE:                                                                                                                                                                                     ? ?  SUBJECTIVE STATEMENT: ? ?10/17/21: Pt states mild soreness after eval, did ice at home. She reports not using her arm much.  ? ? ?Eval: Pt is a member here at [employer].  Pt loves swimming.  She was swimming freestyle in late January and felt a pop having immediate pain.  Pt saw Dr. Steward Drone and had x rays.  X rays were negative and MD ordered a MRI.  MD had concerns of an acute RTC injury.  Pt reports MRI was denied by insurance.  Pt used a brace and a sling and performed exercises from MD which were wall walks AAROM and shoulder pulleys.  Pt continued to have the same sx's and returned to MD on 09/20/2021.  MD again ordered a MRI and also ordered PT.  Pt states she has learned how not to use or move her shoulder and reports her shoulder is marginally better.  MD order indicated L shoulder RC program for strengthening   ? ?Pt favors her R UE and her R UE is getting sore due to increased activity of R UE.  Pt has increased pain and difficulty with performing hair care and dressing.  Pt is unable to lift objects with L UE.  Pt is severely limited with reaching with L UE.  She has been swimming just using R UE.  Pt does not carry objects with L UE into and out of the courtroom.    ? ? ?PERTINENT HISTORY: ?possible RTC injury, waiting for MRI; Cirrhosis of liver  ? ? ? ?PAIN:  ?Are you  having pain? Yes ?NPRS:  0/10 current at rest, 7/10 worst  ?Location:  L anterior shoulder ?Type:  sharp  ? ?PRECAUTIONS: Other: possible RTC injury ? ?WEIGHT BEARING RESTRICTIONS No ? ?FALLS:  ?Has patient fallen in last 6 months? No ? ? ? 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   ? ?PLOF: Independent.  Pt was able to perform all of her ADLs/IADLs, reaching activities, overhead activities without limitation or pain.  Pt was able to perform her normal work and lifting activities without limitation or pain.  ? ?PATIENT GOALS return to swimming normally, independently change her shirt, increased activities in the gym. ? ?OBJECTIVE:  ? ?DIAGNOSTIC FINDINGS:  ?X rays:  negative ? ? ?Pt is R hand dominant ? ? ?PATIENT SURVEYS:  ?FOTO 44 with a goal of 68 at visit #13. ? ?COGNITION: ? Overall cognitive status: Within functional limits for tasks assessed ?    ? ?UPPER EXTREMITY ROM:  ? ?AROM/PROM Right ?10/11/2021 Left ?10/11/2021  ?Shoulder flexion 156 75-80 with pain/154  ?Shoulder scaption 154 142  ?Shoulder abduction 138 138  ?Shoulder adduction    ?Shoulder ER 93 51/68 with pain  ?Shoulder IR 67 at 90 deg abd 75 at approx 60 deg  ?Elbow flexion    ?Elbow extension    ?Wrist flexion    ?Wrist extension    ?Wrist ulnar deviation    ?Wrist radial deviation    ?Wrist pronation    ?Wrist supination    ?(Blank rows = not tested) ? ?UPPER EXTREMITY MMT: ? ?MMT Right ?10/11/2021 Left ?10/11/2021  ?Shoulder flexion 5/5   ?Shoulder scaption 5/5 Weak; Tolerated min to mod resistance  ?Shoulder abduction 5/5 Unable to tolerate resistance  ?Shoulder adduction    ?Shoulder internal rotation 4+/5 Tol minimal resistance  ?Shoulder external rotation 5/5 Unable to tolerate resistance  ?Middle trapezius    ?Lower trapezius    ?Elbow flexion    ?Elbow extension    ?Wrist flexion    ?  Wrist extension    ?Wrist ulnar deviation    ?Wrist radial deviation    ?Wrist pronation    ?Wrist supination    ?Grip strength (lbs)    ?(Blank rows = not tested) ? ?SHOULDER  SPECIAL TESTS: ? Rotator cuff assessment: Lift off test:  negative bilat ; ER Lag Test: negative bilat ?  ? ?PALPATION:  ?Pt had no tenderness t/o L shoulder except some tenderness at supraspinatus. ?  ?TODAY'S TREATMENT:  ? ?Therapeutic Exercise: ?Aerobic: ?Supine: Flexion AAROM x 15;  SA punch 2x10; ?S/L:  Shoulder ER 2 x10 on L;  ?Seated: Pulley flexion x 2 min, scaption x 2 min;  ?Standing: Scap squeezes 2x10;  Bil ER YT x 10;  ?Stretches:  ?Neuromuscular Re-education: ?Manual Therapy: LAD, Inf and post GHJ mobs on L; PROM for all motions.  ?Self Care: ? ? ? ?PATIENT EDUCATION: ?Education details: educated on initial HEP ?Person educated: Patient ?Education method: Medical illustrator Education comprehension: verbalized understanding, returned demonstration, verbal cues required, tactile cues required, and needs further education ? ? ?HOME EXERCISE PROGRAM: ?Access Code: [email or web address] ?URL: [email or web address] ?Date: 10/17/2021 ?Prepared by: Sedalia Muta ? ?Exercises ?- Standing Scapular Retraction  - 2 x daily - 1 sets - 10 reps ?- Supine Shoulder Flexion Extension AAROM with [place]  - 1 x daily - 1 sets - 10 reps ?- Sidelying Shoulder External Rotation  - 1 x daily - 1-2 sets - 10 reps ?- Standing Shoulder External Rotation with Resistance  - 1 x daily - 1-2 sets - 10 reps ? ?ASSESSMENT: ? ?CLINICAL IMPRESSION: ? ?10/17/21:  ?Pt with good ability for initial ROM and strengthening today. She has minimal pain with activities performed, and  was able to perform ER with light resistance without increased pain. She has good ability for PROM, but limited ability for AROM against gravity due to pain.  Discussed option for icing after PT if she is sore for 1st few sessions. Pt to benefit from progression of strengthening as tolerated.  ? ? ?Patient is a 46 y.o. female with a dx of L RTC tendinitis presenting to the clinic with L shoulder pain, muscle weakness in L shoulder, and limited ROM in L  shoulder.  MD notes indicated possible acute RTC injury.  MD has ordered a MRI and pt is waiting to be scheduled.  Pt favors her R UE with daily activities.  Pt states her L shoulder may be marginally better, but s

## 2021-10-30 ENCOUNTER — Ambulatory Visit: Payer: Managed Care, Other (non HMO) | Admitting: Pulmonary Disease

## 2021-11-01 ENCOUNTER — Ambulatory Visit (INDEPENDENT_AMBULATORY_CARE_PROVIDER_SITE_OTHER): Payer: Managed Care, Other (non HMO)

## 2021-11-01 ENCOUNTER — Encounter: Payer: Self-pay | Admitting: Pulmonary Disease

## 2021-11-01 ENCOUNTER — Ambulatory Visit: Payer: Managed Care, Other (non HMO) | Admitting: Pulmonary Disease

## 2021-11-01 ENCOUNTER — Ambulatory Visit (HOSPITAL_BASED_OUTPATIENT_CLINIC_OR_DEPARTMENT_OTHER): Payer: Managed Care, Other (non HMO) | Admitting: Physical Therapy

## 2021-11-01 ENCOUNTER — Encounter (HOSPITAL_BASED_OUTPATIENT_CLINIC_OR_DEPARTMENT_OTHER): Payer: Self-pay | Admitting: Physical Therapy

## 2021-11-01 VITALS — BP 126/74 | HR 85 | Temp 98.2°F | Ht 68.0 in | Wt 181.4 lb

## 2021-11-01 DIAGNOSIS — M6281 Muscle weakness (generalized): Secondary | ICD-10-CM

## 2021-11-01 DIAGNOSIS — M25512 Pain in left shoulder: Secondary | ICD-10-CM

## 2021-11-01 DIAGNOSIS — J90 Pleural effusion, not elsewhere classified: Secondary | ICD-10-CM

## 2021-11-01 DIAGNOSIS — M25612 Stiffness of left shoulder, not elsewhere classified: Secondary | ICD-10-CM

## 2021-11-01 DIAGNOSIS — M7582 Other shoulder lesions, left shoulder: Secondary | ICD-10-CM | POA: Diagnosis not present

## 2021-11-01 NOTE — Progress Notes (Signed)
? ?      ?Rebecca Bonilla    [id number]    1976-02-25 ? ?Primary Care Physician:Jobe, Garrison [place]., MD ? ?Referring Physician: Malka So., MD ?[address] ?[address] ?[place],  [place] [address] ? ?Chief complaint: Follow-up for recurrent pleural effusion ? ?HPI: ?46 year old with EtOH cirrhosis, portal hypertension, recurrent left pleural effusion with thoracentesis in April 2022.  She was readmitted on 12/3 with worsening dyspnea, left effusion and had a repeat thoracentesis on 06/10/2021 with 1.8 L of bloody fluid removed.  She then had third thoracentesis on 12/12 with removal of 1.1 L of bloody appearing fluid ? ?Pleural fluid studies show exudative fluid with lymphocyte predominance.  Cytology and microbiology are negative to date. ? ?She was admitted on 12/20 for chest tube placement for drainage of recurrent left pleural effusion.  Chest tube placed with drainage of 1270 of bloody fluid with improvement in chest x-ray.  Chest tube was removed on 12/23 and she was discharged with increased dose of diuretics ? ?Interim history: ?Here for follow-up of effusion.  Her last chest x-ray in January did not show any residual effusion ?She has followed up with a hepatologist at [facility] and is doing much better.  She has been taken off the transplant list due to improving liver function and low MELD score ? ?Outpatient Encounter Medications as of 11/01/2021  ?Medication Sig  ? clindamycin (CLINDAGEL) 1 % gel Apply topically 2 (two) times daily.  ? clobetasol ointment (TEMOVATE) 0.05 % Apply 1 application topically 2 (two) times daily as needed (psoriasis- bilateral legs).  ? furosemide (LASIX) 20 MG tablet Take 20 mg by mouth every morning.  ? lidocaine (XYLOCAINE) 2 % solution Use as directed 5 mLs in the mouth or throat every 3 (three) hours as needed (tooth pain).  ? LORazepam (ATIVAN) 0.5 MG tablet Take by mouth.  ? Potassium Chloride ER 20 MEQ TBCR Take 20 mEq by mouth every morning.  ?  spironolactone (ALDACTONE) 100 MG tablet Take 1 tablet (100 mg total) by mouth daily.  ? triamcinolone cream (KENALOG) 0.1 % Apply 1 application topically daily as needed (psoriasis- bilateral legs).  ? TYLENOL CHILDRENS CHEWABLES 160 MG CHEW Chew 80 mg by mouth daily as needed (for headaches).  ? [DISCONTINUED] medroxyPROGESTERone (PROVERA) 10 MG tablet Take 1 tablet (10 mg total) by mouth daily. Use for ten days  ? ?No facility-administered encounter medications on file as of 11/01/2021.  ? ? ?Physical Exam: ?Blood pressure 126/74, pulse 85, temperature 98.2 ?F (36.8 ?C), temperature source Oral, height 5\' 8"  (1.727 m), weight 181 lb 6.4 oz (82.3 kg), SpO2 100 %. ?Gen:      No acute distress ?HEENT:  EOMI, sclera anicteric ?Neck:     No masses; no thyromegaly ?Lungs:    Clear to auscultation bilaterally; normal respiratory effort ?CV:         Regular rate and rhythm; no murmurs ?Abd:      + bowel sounds; soft, non-tender; no palpable masses, no distension ?Ext:    No edema; adequate peripheral perfusion ?Skin:      Warm and dry; no rash ?Neuro: alert and oriented x 3 ?Psych: normal mood and affect  ? ?Data Reviewed: ?Imaging: ?CT chest 06/18/2021-large left pleural effusion remains with associated atelectasis of the left lower lobe.  There is subsegmental atelectasis in the left upper lobe. ? ?Chest x-ray 07/31/2021-resolution of pleural effusion ? ?I have reviewed the images personally. ? ?PFTs: ? ?Labs: ?Pleural studies  ?  10/30/2020-LDH 174, total protein 4.3, cells 990 with 19% lymphs ?06/10/2021-LDH, protein not sent, cells 425 with 49% lymphs ?06/18/2021-LDH 298, cells 1161 with 82% lymphs ? ?Cytology  ?10/30/2020-reactive mesothelial cells ?06/10/2021-no malignant cells ?06/18/2021-no malignant cells ?06/27/2021-no malignant cells ? 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  Lymphocyte predominance is also concerning ?There is no objective evidence  of infection and multiple fluid cytologies x 4 have been negative for malignancy ? ?With improving liver function I think risk of recurrence is low ?We will get a chest x-ray today and if normal then she can follow-up as needed ? ?Plan/Recommendations: ?Chest x-ray ? ?Chilton Greathouse MD ?[facility] ?11/01/2021, 10:28 AM ? ?CC: Malka So., MD ? ?  ?

## 2021-11-01 NOTE — Therapy (Signed)
?OUTPATIENT PHYSICAL THERAPY SHOULDER TREATMENT ? ? ?Patient Name: Rebecca Bonilla ?MRN: [id number] ?DOB:May 07, 1976, 46 y.o., female ?Today's Date: 11/02/2021 ? ? PT End of Session - 11/01/21 1437   ? ? Visit Number 3   ? Number of Visits 12   ? Date for PT Re-Evaluation 11/22/21   ? Authorization Type [employer]   ? PT Start Time 1351   ? PT Stop Time [id number]   ? PT Time Calculation (min) 41 min   ? Activity Tolerance Patient tolerated treatment well   ? Behavior During Therapy [facility] for tasks assessed/performed   ? ?  ?  ? ?  ? ? ? ? ?Past Medical History:  ?Diagnosis Date  ? Alcohol abuse   ? Cirrhosis of liver with ascites ([place]) 08/14/2020  ? Hyponatremia 08/15/2020  ? Macrocytosis 10/29/2020  ? Portal hypertension ([place]) 08/14/2020  ? Rotator cuff disorder   ? ?Past Surgical History:  ?Procedure Laterality Date  ? ANKLE SURGERY    ? x 2  ? CHEST TUBE INSERTION Left 06/27/2021  ? Procedure: CHEST TUBE INSERTION;  Surgeon: Laurin Coder, MD;  Location: [facility];  Service: Pulmonary;  Laterality: Left;  ? PARACENTESIS    ? THORACENTESIS N/A 06/18/2021  ? Procedure: THORACENTESIS;  Surgeon: Freddi Starr, MD;  Location: [facility];  Service: Pulmonary;  Laterality: N/A;  ? ?Patient Active Problem List  ? Diagnosis Date Noted  ? 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 Recurrent left pleural effusion 06/26/2021  ? Flank pain 06/09/2021  ? Hematuria 06/09/2021  ? Ascites 04/02/2021  ? Secondary esophageal varices without bleeding ([place]) 12/19/2020  ? History of colon polyps 12/19/2020  ? SOB (shortness of breath) 10/30/2020  ? Pleural effusion 10/29/2020  ? Macrocytosis 10/29/2020  ? Hypokalemia 10/29/2020  ? Hypophosphatemia 10/29/2020  ? Hypomagnesemia 10/29/2020  ? Leukocytosis 10/12/2020  ? Thrombocytopenia ([place]) 10/12/2020  ? Anemia, chronic disease 09/20/2020  ? Alcoholism ([place]) 08/25/2020  ? Hyponatremia 08/15/2020  ? Cirrhosis of liver with ascites ([place]) 08/14/2020  ? Portal hypertension ([place]) 08/14/2020  ?  Alcoholic cirrhosis of liver with ascites ([place]) 08/14/2020  ? Gallbladder sludge 08/14/2020  ? Psoriasis 08/09/2020  ? ? ?PCP: Lilian Coma., MD ? ?REFERRING PROVIDER:  ? ?REFERRING DIAG: M75.82 (ICD-10-CM) - Rotator cuff tendinitis, left  ? ?THERAPY DIAG:  ?Left shoulder pain, unspecified chronicity ? ?Muscle weakness (generalized) ? ?Stiffness of left shoulder, not elsewhere classified ? ? ?ONSET DATE: End of January 2023 ? ?SUBJECTIVE:                                                                                                                                                                                     ? ?  SUBJECTIVE STATEMENT: ?Pt reports she had soreness after prior Rx and had to use ice on her shoulder.  Pt states the exercises are helping and her strength is improving.  Pt reports compliance with HEP.  Pt states her shoulder doesn't bother her as much at night as it used to and she is sleeping better. Pt has pain and difficulty with reaching forward.  Pt has been swimming not using L UE and just using R UE.  Pt states she may wait to be schedule MRI and see how she progresses in PT.  PT instructed pt to let MD know if she is going to hold on MRI for now.  ? ? ?PERTINENT HISTORY: ?possible RTC injury, waiting for MRI; Cirrhosis of liver  ? ? ? ?PAIN:  ?Are you having pain? Yes ?NPRS:  0/10 current at rest, 7/10 worst  ?Location:  L anterior shoulder ?Type:  sharp  ? ?PRECAUTIONS: Other: possible RTC injury ? ?WEIGHT BEARING RESTRICTIONS No ? ?FALLS:  ?Has patient fallen in last 6 months? No ? ? 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   ? ?PLOF: Independent.  Pt was able to perform all of her ADLs/IADLs, reaching activities, overhead activities without limitation or pain.  Pt was able to perform her normal work and lifting activities without limitation or pain.  ? ?PATIENT GOALS return to swimming normally, independently change her shirt, increased activities in the gym. ? ?OBJECTIVE:  ? ?DIAGNOSTIC FINDINGS:  ?X  rays:  negative ? ?  ?TODAY'S TREATMENT:  ? ?Therapeutic Exercise: ?Reviewed current function, HEP compliance, pain levels, and response to prior Rx. ?Reviewed HEP.  ?Pt received L shoulder PROM in flexion, abd, and scaption per pt tolerance and tissue tolerance. ?Supine: Flexion AAROM x 15;  SA punch 2x10; shoulder ABC x 1 rep, rhythmic stab's at 90 deg 3x30 sec ?S/L:  Shoulder ER x15 and x10 on L;  ?Standing: Wall Walks in flexion AAROM x 6 reps, Scap retraction with YTB 2x10;  Bil ER YTB x15 and with RTB 2 x 10 ?Prone:  Prone extension to neutral 2x10 reps ? ? ? ? ?PATIENT EDUCATION: ?Education details: HEP, POC, exercise form, rationale of exercises ?Person educated: Patient ?Education method: Customer service manager Education comprehension: verbalized understanding, returned demonstration, verbal cues required, tactile cues required, and needs further education ? ? ?HOME EXERCISE PROGRAM: ?Access Code: [email or web address] ?URL: [email or web address] ?Date: 10/17/2021 ?Prepared by: Lyndee Hensen ? ?Exercises ?- Standing Scapular Retraction  - 2 x daily - 1 sets - 10 reps ?- Supine Shoulder Flexion Extension AAROM with Dowel  - 1 x daily - 1 sets - 10 reps ?- Sidelying Shoulder External Rotation  - 1 x daily - 1-2 sets - 10 reps ?- Standing Shoulder External Rotation with Resistance  - 1 x daily - 1-2 sets - 10 reps ? ?ASSESSMENT: ? ?CLINICAL IMPRESSION: ?Pt is making progress with sx's, ROM, function, and strength.  She continues to have pain and difficulty with reaching forward.  Pt performed exercises well with cuing and instruction in correct form.  Pt performs supine shoulder wand flexion very well with good ROM starting at 90 deg.  She has increased pain if trying to go below 90 deg.  Pt had shoulder pain when lifting wand from her thighs going against gravity in supine though felt fine when lowering below 90 deg.  Pt had no pain with resisted ER.  Pt responded well to Rx and was fatigued after  Rx.  Pt states she will ice her shoulder when she  returns home.  Pt should benefit from skilled PT services to address impairments and improve overall function.  ? ? ?OBJECTIVE IMPAIRMENTS decreased activity tolerance, decreased ROM, decreased strength, hypomobility, impaired flexibility, impaired UE functional use, and pain.  ? ?ACTIVITY LIMITATIONS cleaning, occupation, laundry, and lifting objects, dressing, and swimming .  ? ? ? ?REHAB POTENTIAL: Good ? ?CLINICAL DECISION MAKING: Stable/uncomplicated ? ?EVALUATION COMPLEXITY: Low ? ? ?GOALS: ? ?SHORT TERM GOALS: Target date: 11/01/2021 ? ?Pt will be independent and compliant with HEP for improved pain, strength, ROM, and function.  ?Baseline: ?Goal status: INITIAL ? ?2.  Pt will report at least a 25% improvement with reaching activities.  ?Baseline:  ?Goal status: INITIAL ? ?3.  Pt will be able to perform dressing and hair care without significant difficulty and pain. ?Baseline:  ?Goal status: INITIAL ?Target date:  11/08/2021 ? ?4.  Pt will demo improved L shoulder AROM by 30 deg in flex and 10 deg in ER for improved mobility and performance of ADLs.  ?Baseline:  ?Goal status: INITIAL ? ? ?LONG TERM GOALS: Target date: 11/22/2021 ? ?Pt will be able to perform her normal reaching activities without significant pain or difficulty. ?Baseline:  ?Goal status: INITIAL ? ?2.  Pt will be able to perform her ADLs without significant limitation.  ?Baseline:  ?Goal status: INITIAL ? ?3.  Pt will demo improved L shoulder strength to at least 4/5 MMT t/o for improved tolerance with functional lifting and carrying.   ?Baseline:  ?Goal status: INITIAL ? ?4.  Pt will be able to perform her normal household chores and functional lifting around her home without adverse effects.  ?Baseline:  ?Goal status: INITIAL ? ? ?PLAN: ?PT FREQUENCY: 2x/week ? ?PT DURATION: 6 weeks ? ?PLANNED INTERVENTIONS: Therapeutic exercises, Therapeutic activity, Neuromuscular re-education, Patient/Family  education, Joint mobilization, Aquatic Therapy, Dry Needling, Electrical stimulation, Spinal mobilization, Cryotherapy, Moist heat, Taping, Ultrasound, and Manual therapy ? ?PLAN FOR NEXT SESSION:   Cont with

## 2021-11-01 NOTE — Patient Instructions (Addendum)
We will get a chest x-ray today to reevaluate the fluid around the lung ?We will give the results over the phone ?Follow-up as needed ? ?

## 2021-11-05 NOTE — Therapy (Signed)
?OUTPATIENT PHYSICAL THERAPY SHOULDER TREATMENT ? ? ?Patient Name: Rebecca Bonilla ?MRN: [id number] ?DOB:06-Mar-1976, 46 y.o., female ?Today's Date: 11/07/2021 ? ? PT End of Session - 11/06/21 0854   ? ? Visit Number 4   ? Number of Visits 12   ? Date for PT Re-Evaluation 11/22/21   ? Authorization Type [employer]igna   ? PT Start Time 0802   ? PT Stop Time 0847   ? PT Time Calculation (min) 45 min   ? Activity Tolerance Patient tolerated treatment well   ? Behavior During Therapy [facility] for tasks assessed/performed   ? ?  ?  ? ?  ? ? ? ? ? ?Past Medical History:  ?Diagnosis Date  ? Alcohol abuse   ? 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  ? Hyponatremia 08/15/2020  ? Macrocytosis 10/29/2020  ? Portal hypertension ([facility]) 08/14/2020  ? Rotator cuff disorder   ? ?Past Surgical History:  ?Procedure Laterality Date  ? ANKLE SURGERY    ? x 2  ? CHEST TUBE INSERTION Left 06/27/2021  ? Procedure: CHEST TUBE INSERTION;  Surgeon: Tomma Lightning, MD;  Location: [facility]OSCOPY;  Service: Pulmonary;  Laterality: Left;  ? PARACENTESIS    ? THORACENTESIS N/A 06/18/2021  ? Procedure: THORACENTESIS;  Surgeon: [name], MD;  Location: [facility];  Service: Pulmonary;  Laterality: N/A;  ? ?Patient Active Problem List  ? Diagnosis Date Noted  ? 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 Recurrent left pleural effusion 06/26/2021  ? Flank pain 06/09/2021  ? Hematuria 06/09/2021  ? Ascites 04/02/2021  ? Secondary esophageal varices without bleeding ([facility]) 12/19/2020  ? History of colon polyps 12/19/2020  ? SOB (shortness of breath) 10/30/2020  ? Pleural effusion 10/29/2020  ? Macrocytosis 10/29/2020  ? Hypokalemia 10/29/2020  ? Hypophosphatemia 10/29/2020  ? Hypomagnesemia 10/29/2020  ? Leukocytosis 10/12/2020  ? Thrombocytopenia ([facility]) 10/12/2020  ? Anemia, chronic disease 09/20/2020  ? Alcoholism ([facility]) 08/25/2020  ? Hyponatremia 08/15/2020  ? 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  ? Portal hypertension ([facility]) 08/14/2020  ?  Alcoholic cirrhosis of liver with ascites ([facility]) 08/14/2020  ? Gallbladder sludge 08/14/2020  ? Psoriasis 08/09/2020  ? ? ?PCP: Malka So., MD ? ?REFERRING PROVIDER:  ? ?REFERRING DIAG: M75.82 (ICD-10-CM) - Rotator cuff tendinitis, left  ? ?THERAPY DIAG:  ?Left shoulder pain, unspecified chronicity ? ?Muscle weakness (generalized) ? ?Stiffness of left shoulder, not elsewhere classified ? ? ?ONSET DATE: End of January 2023 ? ?SUBJECTIVE:                                                                                                                                                                                     ? ?  SUBJECTIVE STATEMENT: ?Pt is 3 months s/p pain/injury.  Pt states she was sore after prior Rx.  Pt reports she feels stronger since prior Rx.  Pt reports compliance with HEP and is using RTB at home without adverse effects.  Pt states "I can feel progress for sure".  Pt reports she is able to lift UE more in front of her but still has difficulty with approx 70-80 deg against gravity.  Pt reports improved lateral reaching.   ? ?FUNCTIONAL DEFICITS:  Reaching into overhead cabinets, reaching forward and grabbing something off of a shelf and counter, lifting grocery bags.   ? ? ?PERTINENT HISTORY: ?possible RTC injury, waiting for MRI; Cirrhosis of liver  ? ? ? ?PAIN:  ?Are you having pain? Yes ?NPRS:  1-2/10 current at rest, 7/10 worst  ?Location:  L anterior shoulder ?Type:  sharp  ? ?PRECAUTIONS: Other: possible RTC injury ? ?WEIGHT BEARING RESTRICTIONS No ? ?FALLS:  ?Has patient fallen in last 6 months? No ? ? 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   ? ?PLOF: Independent.  Pt was able to perform all of her ADLs/IADLs, reaching activities, overhead activities without limitation or pain.  Pt was able to perform her normal work and lifting activities without limitation or pain.  ? ?PATIENT GOALS return to swimming normally, independently change her shirt, increased activities in the gym. ? ?OBJECTIVE:  ? ?DIAGNOSTIC  FINDINGS:  ?X rays:  negative ? ?  ?TODAY'S TREATMENT:  ? ?Therapeutic Exercise: ?Reviewed current function, HEP compliance, pain levels, and response to prior Rx. ?Reviewed and updated HEP.  Gave pt a HEP handout.   ? ?Supine: SA punch 2x10; shoulder ABC x 1 rep, rhythmic stab's at 90 deg 3x30 sec, ER/IR rhythmic stabs 2x30 sec and 1x30 at 45 deg  ?S/L:  Shoulder ER 2 x10 on L;  ?Standing:  Scap retraction with YTB 2x10;  Bil ER RTB 2 x 10 ?Prone:  Prone extension to neutral 2x10 reps, prone row to neutral 2x10 reps ? ? ?Manual Therapy: LAD, Grade II Inf and post GHJ mobs on L to improve pain, stiffness, and normalize arthrokinematics.  Pt received L shoulder flexion, scaption, and abd PROM per pt tol and tissue tolerance.  ? ?PATIENT EDUCATION: ?Education details: HEP, POC, exercise form, rationale of exercises.  Gave pt a HEP handout and educated pt in correct form and appropriate frequency.  PT instructed pt she should not have increased pain with HEP. ?Person educated: Patient ?Education method: Medical illustrator Education comprehension: verbalized understanding, returned demonstration, verbal cues required, tactile cues required, and needs further education ? ? ?HOME EXERCISE PROGRAM: ?Access Code: [email or web address] ?URL: [email or web address] ?Date: 10/17/2021 ?Prepared by: Sedalia Muta ? ?Exercises ?- Standing Scapular Retraction  - 2 x daily - 1 sets - 10 reps ?- Supine Shoulder Flexion Extension AAROM with Dowel  - 1 x daily - 1 sets - 10 reps ?- Sidelying Shoulder External Rotation  - 1 x daily - 1-2 sets - 10 reps ?- Standing Shoulder External Rotation with Resistance  - 1 x daily - 1-2 sets - 10 reps ? ?Updated HEP today: ?- Single Arm Serratus Punches  - 1 x daily - 5-6 x weekly - 2-3 sets - 10 reps ?- Supine Shoulder Alphabet  - 1 x daily - 7 x weekly - 1 reps ? ?ASSESSMENT: ? ?CLINICAL IMPRESSION: ?Pt is improving with strength and reaching as evidenced by subjective reports.  She  continues to be limited and painful with certain ranges against gravity with forward  reaching.  She has improved with UE elevation.  Pt performed exercises well with cuing and instruction in correct form.  Pt had no pain with resisted ER.  PT updated HEP and pt demonstrates good understanding.  Pt states she can feel that she worked her arm after Rx though it's not painful.  Pt should benefit from continued skilled PT services to address impairments and improve overall function.  ? ? ? ?OBJECTIVE IMPAIRMENTS decreased activity tolerance, decreased ROM, decreased strength, hypomobility, impaired flexibility, impaired UE functional use, and pain.  ? ?ACTIVITY LIMITATIONS cleaning, occupation, laundry, and lifting objects, dressing, and swimming .  ? ? ? ?REHAB POTENTIAL: Good ? ?CLINICAL DECISION MAKING: Stable/uncomplicated ? ?EVALUATION COMPLEXITY: Low ? ? ?GOALS: ? ?SHORT TERM GOALS: Target date: 11/01/2021 ? ?Pt will be independent and compliant with HEP for improved pain, strength, ROM, and function.  ?Baseline: ?Goal status: INITIAL ? ?2.  Pt will report at least a 25% improvement with reaching activities.  ?Baseline:  ?Goal status: INITIAL ? ?3.  Pt will be able to perform dressing and hair care without significant difficulty and pain. ?Baseline:  ?Goal status: INITIAL ?Target date:  11/08/2021 ? ?4.  Pt will demo improved L shoulder AROM by 30 deg in flex and 10 deg in ER for improved mobility and performance of ADLs.  ?Baseline:  ?Goal status: INITIAL ? ? ?LONG TERM GOALS: Target date: 11/22/2021 ? ?Pt will be able to perform her normal reaching activities without significant pain or difficulty. ?Baseline:  ?Goal status: INITIAL ? ?2.  Pt will be able to perform her ADLs without significant limitation.  ?Baseline:  ?Goal status: INITIAL ? ?3.  Pt will demo improved L shoulder strength to at least 4/5 MMT t/o for improved tolerance with functional lifting and carrying.   ?Baseline:  ?Goal status: INITIAL ? ?4.  Pt  will be able to perform her normal household chores and functional lifting around her home without adverse effects.  ?Baseline:  ?Goal status: INITIAL ? ? ?PLAN: ?PT FREQUENCY: 2x/week ? ?PT DURATION:

## 2021-11-06 ENCOUNTER — Ambulatory Visit (HOSPITAL_BASED_OUTPATIENT_CLINIC_OR_DEPARTMENT_OTHER): Payer: Managed Care, Other (non HMO) | Attending: Orthopaedic Surgery | Admitting: Physical Therapy

## 2021-11-06 ENCOUNTER — Encounter (HOSPITAL_BASED_OUTPATIENT_CLINIC_OR_DEPARTMENT_OTHER): Payer: Self-pay | Admitting: Physical Therapy

## 2021-11-06 DIAGNOSIS — M25612 Stiffness of left shoulder, not elsewhere classified: Secondary | ICD-10-CM | POA: Diagnosis present

## 2021-11-06 DIAGNOSIS — M25512 Pain in left shoulder: Secondary | ICD-10-CM

## 2021-11-06 DIAGNOSIS — M6281 Muscle weakness (generalized): Secondary | ICD-10-CM

## 2021-11-09 ENCOUNTER — Ambulatory Visit (HOSPITAL_BASED_OUTPATIENT_CLINIC_OR_DEPARTMENT_OTHER): Payer: Managed Care, Other (non HMO) | Admitting: Physical Therapy

## 2021-11-09 ENCOUNTER — Encounter (HOSPITAL_BASED_OUTPATIENT_CLINIC_OR_DEPARTMENT_OTHER): Payer: Self-pay | Admitting: Physical Therapy

## 2021-11-09 DIAGNOSIS — M6281 Muscle weakness (generalized): Secondary | ICD-10-CM

## 2021-11-09 DIAGNOSIS — M25512 Pain in left shoulder: Secondary | ICD-10-CM

## 2021-11-09 DIAGNOSIS — M25612 Stiffness of left shoulder, not elsewhere classified: Secondary | ICD-10-CM

## 2021-11-09 NOTE — Therapy (Addendum)
?OUTPATIENT PHYSICAL THERAPY SHOULDER TREATMENT ? ? ?Patient Name: Rebecca Bonilla ?MRN: [id number] ?DOB:Sep 08, 1975, 46 y.o., female ?Today's Date: 11/09/2021 ? ? PT End of Session - 11/09/21 2029   ? ? Visit Number 5   ? Number of Visits 12   ? Date for PT Re-Evaluation 11/22/21   ? Authorization Type [employer]   ? PT Start Time 1322   ? PT Stop Time 1400   ? PT Time Calculation (min) 38 min   ? Activity Tolerance Patient tolerated treatment well   ? Behavior During Therapy [facility] for tasks assessed/performed   ? ?  ?  ? ?  ? ? ? ? ? ? ?Past Medical History:  ?Diagnosis Date  ? Alcohol abuse   ? 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  ? Hyponatremia 08/15/2020  ? Macrocytosis 10/29/2020  ? Portal hypertension (HCC) 08/14/2020  ? Rotator cuff disorder   ? ?Past Surgical History:  ?Procedure Laterality Date  ? ANKLE SURGERY    ? x 2  ? CHEST TUBE INSERTION Left 06/27/2021  ? Procedure: CHEST TUBE INSERTION;  Surgeon: Tomma Lightning, MD;  Location: [facility] ENDOSCOPY;  Service: Pulmonary;  Laterality: Left;  ? PARACENTESIS    ? THORACENTESIS N/A 06/18/2021  ? Procedure: THORACENTESIS;  Surgeon: [name], MD;  Location: [facility] ENDOSCOPY;  Service: Pulmonary;  Laterality: N/A;  ? ?Patient Active Problem List  ? Diagnosis Date Noted  ? 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 Recurrent left pleural effusion 06/26/2021  ? Flank pain 06/09/2021  ? Hematuria 06/09/2021  ? Ascites 04/02/2021  ? Secondary esophageal varices without bleeding (HCC) 12/19/2020  ? History of colon polyps 12/19/2020  ? SOB (shortness of breath) 10/30/2020  ? Pleural effusion 10/29/2020  ? Macrocytosis 10/29/2020  ? Hypokalemia 10/29/2020  ? Hypophosphatemia 10/29/2020  ? Hypomagnesemia 10/29/2020  ? Leukocytosis 10/12/2020  ? Thrombocytopenia (HCC) 10/12/2020  ? Anemia, chronic disease 09/20/2020  ? Alcoholism (HCC) 08/25/2020  ? Hyponatremia 08/15/2020  ? 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  ? Portal hypertension (HCC) 08/14/2020  ?  Alcoholic cirrhosis of liver with ascites (HCC) 08/14/2020  ? Gallbladder sludge 08/14/2020  ? Psoriasis 08/09/2020  ? ? ?PCP: Malka So., MD ? ?REFERRING PROVIDER:  ? ?REFERRING DIAG: M75.82 (ICD-10-CM) - Rotator cuff tendinitis, left  ? ?THERAPY DIAG:  ?Left shoulder pain, unspecified chronicity ? ?Muscle weakness (generalized) ? ?Stiffness of left shoulder, not elsewhere classified ? ? ?ONSET DATE: End of January 2023 ? ?SUBJECTIVE:                                                                                                                                                                                     ? ?  SUBJECTIVE STATEMENT: ?Pt is 3 months s/p pain/injury.  Pt reports improved lateral reaching.   ? ?Pt reports she had minimal soreness after prior Rx, much less soreness than last time.  Pt states she didn't use ice after prior Rx.  Pt reports she performed her home exercises in the gym yesterday.  She is compliant with HEP and reports no adverse effects after prior Rx.    ?FUNCTIONAL DEFICITS:  Reaching into overhead cabinets, reaching forward and grabbing something off of a shelf and counter, lifting grocery bags.   ? ? ?PERTINENT HISTORY: ?possible RTC injury, waiting for MRI; Cirrhosis of liver  ? ? ? ?PAIN:  ?Are you having pain? Yes ?NPRS:  0/10 current at rest, 7/10 worst  ?Location:  L anterior shoulder ?Type:  sharp  ? ?PRECAUTIONS: Other: possible RTC injury ? ?WEIGHT BEARING RESTRICTIONS No ? ?FALLS:  ?Has patient fallen in last 6 months? No ? ? 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   ? ?PLOF: Independent.  Pt was able to perform all of her ADLs/IADLs, reaching activities, overhead activities without limitation or pain.  Pt was able to perform her normal work and lifting activities without limitation or pain.  ? ?PATIENT GOALS return to swimming normally, independently change her shirt, increased activities in the gym. ? ?OBJECTIVE:  ? ?DIAGNOSTIC FINDINGS:  ?X rays:  negative ? ?  ?TODAY'S TREATMENT:   ? ?Therapeutic Exercise:  (for improved strength, ROM, and pain) ?-Reviewed current function, HEP compliance, pain levels, and response to prior Rx. ?Supine: SA punch x10 AROM and 1# x 10 reps ; wand flexion 2x10 reps ?S/L:  Shoulder ER 2 x10 on L;  ?Standing:  Scap retraction with RTB 2x10; shoulder extension with YTB 2x10 ?Prone:  Prone extension to neutral 2x10 reps, prone row to neutral 2x10 reps ? ?Neuro Re-ed Activities:  (for improved functional stability, kinesthetic awareness, and functional reaching) ?Supine rhythmic stab's at 90 deg 2x30 sec and at 60 2x30 sec, ER/IR rhythmic stabs 1x30 sec at 30 deg, 2x30 sec at 45 deg, and 1x30 sec at 60 deg abd ?Supine shoulder ABC x 1 rep ? ?Manual Therapy: LAD, Grade II Inf and post GHJ mobs on L to improve pain, stiffness, and normalize arthrokinematics.  Pt received L shoulder flexion, scaption, and abd PROM per pt tol and tissue tolerance.  ? ?PATIENT EDUCATION: ?Education details: HEP, POC, exercise form, rationale of exercises.  Instructed pt to use ice if she has increased pain or soreness later after Rx ?Person educated: Patient ?Education method: Medical illustratorxplanation and Demonstration Education comprehension: verbalized understanding, returned demonstration, verbal cues required, tactile cues required, and needs further education ? ? ?HOME EXERCISE PROGRAM: ?Access Code: [email or web address] ?URL: [email or web address] ?Date: 10/17/2021 ?Prepared by: Sedalia MutaLauren Carroll ? ?Exercises ?- Standing Scapular Retraction  - 2 x daily - 1 sets - 10 reps ?- Supine Shoulder Flexion Extension AAROM with Dowel  - 1 x daily - 1 sets - 10 reps ?- Sidelying Shoulder External Rotation  - 1 x daily - 1-2 sets - 10 reps ?- Standing Shoulder External Rotation with Resistance  - 1 x daily - 1-2 sets - 10 reps ? ?Updated HEP today: ?- Single Arm Serratus Punches  - 1 x daily - 5-6 x weekly - 2-3 sets - 10 reps ?- Supine Shoulder Alphabet  - 1 x daily - 7 x weekly - 1  reps ? ?ASSESSMENT: ? ?CLINICAL IMPRESSION: ?Pt is progressing well with pain, sx's, strength, and function.  She demonstrates improved strength as evidenced by  performance and progression of exercises.  pt demonstrates much improved form and ease with supine wand flexion having L UE move through increased range.  PT added 1# to supine serratus punch and pt was able to perform but challenged.  Pt performed exercises well with cuing and instruction in correct form and positioning.  Pt responded well to Rx reporting no pain after Rx and stating her shoulder feels good.  Pt should benefit from continued skilled PT services to address impairments and improve overall function.  ? ? ? ?OBJECTIVE IMPAIRMENTS decreased activity tolerance, decreased ROM, decreased strength, hypomobility, impaired flexibility, impaired UE functional use, and pain.  ? ?ACTIVITY LIMITATIONS cleaning, occupation, laundry, and lifting objects, dressing, and swimming .  ? ? ? ?REHAB POTENTIAL: Good ? ?CLINICAL DECISION MAKING: Stable/uncomplicated ? ?EVALUATION COMPLEXITY: Low ? ? ?GOALS: ? ?SHORT TERM GOALS: Target date: 11/01/2021 ? ?Pt will be independent and compliant with HEP for improved pain, strength, ROM, and function.  ?Baseline: ?Goal status: INITIAL ? ?2.  Pt will report at least a 25% improvement with reaching activities.  ?Baseline:  ?Goal status: INITIAL ? ?3.  Pt will be able to perform dressing and hair care without significant difficulty and pain. ?Baseline:  ?Goal status: INITIAL ?Target date:  11/08/2021 ? ?4.  Pt will demo improved L shoulder AROM by 30 deg in flex and 10 deg in ER for improved mobility and performance of ADLs.  ?Baseline:  ?Goal status: INITIAL ? ? ?LONG TERM GOALS: Target date: 11/22/2021 ? ?Pt will be able to perform her normal reaching activities without significant pain or difficulty. ?Baseline:  ?Goal status: INITIAL ? ?2.  Pt will be able to perform her ADLs without significant limitation.  ?Baseline:   ?Goal status: INITIAL ? ?3.  Pt will demo improved L shoulder strength to at least 4/5 MMT t/o for improved tolerance with functional lifting and carrying.   ?Baseline:  ?Goal status: INITIAL ? ?4.  Pt will be able to perform he

## 2021-11-16 ENCOUNTER — Encounter (HOSPITAL_BASED_OUTPATIENT_CLINIC_OR_DEPARTMENT_OTHER): Payer: Managed Care, Other (non HMO) | Admitting: Physical Therapy

## 2021-11-23 ENCOUNTER — Ambulatory Visit (HOSPITAL_BASED_OUTPATIENT_CLINIC_OR_DEPARTMENT_OTHER): Payer: Managed Care, Other (non HMO) | Admitting: Physical Therapy

## 2021-11-23 DIAGNOSIS — M25512 Pain in left shoulder: Secondary | ICD-10-CM | POA: Diagnosis not present

## 2021-11-23 DIAGNOSIS — M25612 Stiffness of left shoulder, not elsewhere classified: Secondary | ICD-10-CM

## 2021-11-23 DIAGNOSIS — M6281 Muscle weakness (generalized): Secondary | ICD-10-CM

## 2021-11-23 NOTE — Therapy (Addendum)
OUTPATIENT PHYSICAL THERAPY SHOULDER TREATMENT / PROGRESS NOTE   Patient Name: Rebecca Bonilla MRN: [id number] DOB:01-01-1976, 46 y.o., female Today's Date: 11/24/2021   PT End of Session - 11/23/21 1404     Visit Number 6    Number of Visits 12    Date for PT Re-Evaluation 12/21/21    Authorization Type [employer]    PT Start Time 1324    PT Stop Time 1404    PT Time Calculation (min) 40 min    Activity Tolerance Patient tolerated treatment well    Behavior During Therapy [facility] for tasks assessed/performed                  Past Medical History:  Diagnosis Date   Alcohol abuse    Cirrhosis of liver with ascites ([place]) 08/14/2020   Hyponatremia 08/15/2020   Macrocytosis 10/29/2020   Portal hypertension ([place]) 08/14/2020   Rotator cuff disorder    Past Surgical History:  Procedure Laterality Date   ANKLE SURGERY     x 2   CHEST TUBE INSERTION Left 06/27/2021   Procedure: CHEST TUBE INSERTION;  Surgeon: Laurin Coder, MD;  Location: [facility] ENDOSCOPY;  Service: Pulmonary;  Laterality: Left;   PARACENTESIS     THORACENTESIS N/A 06/18/2021   Procedure: Mathews Robinsons;  Surgeon: Freddi Starr, MD;  Location: [facility] ENDOSCOPY;  Service: Pulmonary;  Laterality: N/A;   Patient Active Problem List   Diagnosis Date Noted   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   Recurrent left pleural effusion 06/26/2021   Flank pain 06/09/2021   Hematuria 06/09/2021   Ascites 04/02/2021   Secondary esophageal varices without bleeding ([place]) 12/19/2020   History of colon polyps 12/19/2020   SOB (shortness of breath) 10/30/2020   Pleural effusion 10/29/2020   Macrocytosis 10/29/2020   Hypokalemia 10/29/2020   Hypophosphatemia 10/29/2020   Hypomagnesemia 10/29/2020   Leukocytosis 10/12/2020   Thrombocytopenia ([place]) 10/12/2020   Anemia, chronic disease 09/20/2020   Alcoholism ([place]) 08/25/2020   Hyponatremia 08/15/2020   Cirrhosis of liver with ascites ([place]) 08/14/2020   Portal hypertension  ([place]) 56/15/3794   Alcoholic cirrhosis of liver with ascites ([place]) 08/14/2020   Gallbladder sludge 08/14/2020   Psoriasis 08/09/2020    PCP: Lilian Coma., MD  REFERRING PROVIDER: Vanetta Mulders, MD  REFERRING DIAG: [phone] (ICD-10-CM) - Rotator cuff tendinitis, left   THERAPY DIAG:  Left shoulder pain, unspecified chronicity  Muscle weakness (generalized)  Stiffness of left shoulder, not elsewhere classified   ONSET DATE: End of January 2023  SUBJECTIVE:  SUBJECTIVE STATEMENT: Pt states she must have done something in her sleep Tuesday night because she woke up Wednesday AM hurting.   She was able to perform her HEP Wednesday though unable to perform HEP on Thursday due to pain.  Pt sates she was feeling great prior to this exacerbation.  Pt is compliant with HEP and has been performing her home exercises in the gym.  She reports 40% improvement in reaching and 65-70% with ADLs improvement prior to exacerbation      FUNCTIONAL DEFICITS:  Reaching into overhead cabinets, reaching forward and grabbing something off of a shelf and counter, lifting grocery bags.     PERTINENT HISTORY: possible RTC injury, waiting for MRI; Cirrhosis of liver     PAIN:  Are you having pain? Yes NPRS:  4/10 current at rest, 8/10 worst, 0/10 best Location:  L anterior shoulder Type:  sharp   PRECAUTIONS: Other: possible RTC injury  WEIGHT BEARING RESTRICTIONS No  FALLS:  Has patient fallen in last 6 months? No   OCCUPATION: Attorney.    PLOF: Independent.  Pt was able to perform all of her ADLs/IADLs, reaching activities, overhead activities without limitation or pain.  Pt was able to perform her normal work and lifting activities without limitation or pain.   PATIENT GOALS return to swimming normally,  independently change her shirt, increased activities in the gym.  OBJECTIVE:   DIAGNOSTIC FINDINGS:  X rays:  negative    TODAY'S TREATMENT:  PATIENT SURVEYS:  FOTO 60 which has improved from initially 44. Goal of 68 at visit #13.                                     UPPER EXTREMITY ROM:    AROM/PROM Right 10/12/2021 Left   Shoulder flexion 156 71 with pain/158  Shoulder scaption 154 142  Shoulder abduction 138 102  Shoulder adduction      Shoulder ER 93 63/80 pain with AROM  Shoulder IR 67 at 90 deg abd 75 at approx 60 deg  Elbow flexion      Elbow extension      Wrist flexion      Wrist extension      Wrist ulnar deviation      Wrist radial deviation      Wrist pronation      Wrist supination      (Blank rows = not tested)   UPPER EXTREMITY MMT:   MMT Right 10/12/2021 Left   Shoulder flexion 5/5    Shoulder scaption 5/5 Weak; unable to tolerate resistance  Shoulder abduction 5/5 Unable to tolerate resistance  Shoulder adduction      Shoulder internal rotation 4+/5 4/5  Shoulder external rotation 5/5 Tolerated minimal resistance with pain  Middle trapezius      Lower trapezius      Elbow flexion      Elbow extension      Wrist flexion      Wrist extension      Wrist ulnar deviation      Wrist radial deviation      Wrist pronation      Wrist supination      Grip strength (lbs)      (Blank rows = not tested)   SHOULDER SPECIAL TESTS:            Rotator cuff assessment: Lift off test:  negative bilat ; ER Lag Test:  negative bilat     Therapeutic Exercise:  (for improved strength, ROM, and pain) -Reviewed current function, HEP compliance, pain levels, and response to prior Rx. Seated:  Pulleys in flexion x20 reps and scaption x10 reps Supine: SA punch 2x10 AROM ; wand flexion x8 reps w/n limited range, Supine rhythmic stab's at 90 deg 2x30 sec and 1x30 sec at 60 deg,  Supine shoulder ABC x 1 rep S/L:  Shoulder ER 2 x10 on L  Prone:  Attempted Prone  extension to neutral though stopped due to pain.  prone row to neutral 2x10 reps    PATIENT EDUCATION: Education details: POC, exercise form, rationale of exercises.   Person educated: Patient Education method: Customer service manager Education comprehension: verbalized understanding, returned demonstration, verbal cues required, tactile cues required, and needs further education   HOME EXERCISE PROGRAM: Access Code: [email or web address] URL: [email or web address] Date: 10/17/2021 Prepared by: Lyndee Hensen  Exercises - Standing Scapular Retraction  - 2 x daily - 1 sets - 10 reps - Supine Shoulder Flexion Extension AAROM with Dowel  - 1 x daily - 1 sets - 10 reps - Sidelying Shoulder External Rotation  - 1 x daily - 1-2 sets - 10 reps - Standing Shoulder External Rotation with Resistance  - 1 x daily - 1-2 sets - 10 reps  Updated HEP today: - Single Arm Serratus Punches  - 1 x daily - 5-6 x weekly - 2-3 sets - 10 reps - Supine Shoulder Alphabet  - 1 x daily - 7 x weekly - 1 reps  ASSESSMENT:  CLINICAL IMPRESSION: Pt was making good progress with pain, sx's, strength, and function though has recently had a setback.  She does not recall any specific reason for the setback, and is not sure if she slept on her shoulder wrong.  She just woke up with increased pain a few days ago.   She was improving with strength and progressing with exercises well.  Pt reports 40% improvement in reaching and 65-70% with ADLs improvement prior to exacerbation.  Pt has decreased flexion and abduction AROM though improved ER AROM.  Pt demonstrates improved ER and IR strength though was weaker in scaption.  She has weakness t/o L UE.  Pt continues to have pain with supine wand ranges that are against gravity.  She continues to have limited flexion ROM against gravity.  She had a negative ER lag test and negative lift off test.  Pt has made progress toward goals.  See below for goal progress.  Pt has not  had the MRI yet which the MD ordered.  PT spoke with pt concerning POC.  Pt and PT agree for pt to be put on hold while she has a MRI and follows up with MD.      OBJECTIVE IMPAIRMENTS decreased activity tolerance, decreased ROM, decreased strength, hypomobility, impaired flexibility, impaired UE functional use, and pain.   ACTIVITY LIMITATIONS cleaning, occupation, laundry, and lifting objects, dressing, and swimming .     REHAB POTENTIAL: Good  CLINICAL DECISION MAKING: Stable/uncomplicated  EVALUATION COMPLEXITY: Low   GOALS:  SHORT TERM GOALS: Target date: 11/01/2021  Pt will be independent and compliant with HEP for improved pain, strength, ROM, and function.  Baseline: Goal status: GOAL MET  2.  Pt will report at least a 25% improvement with reaching activities.  Baseline:  Goal status: GOAL MET  3.  Pt will be able to perform dressing and hair care without significant difficulty and pain. Baseline:  Goal  status: PROGRESSING Target date:  11/08/2021  4.  Pt will demo improved L shoulder AROM by 30 deg in flex and 10 deg in ER for improved mobility and performance of ADLs.  Baseline:  Goal status: PARTIALLY MET   LONG TERM GOALS: Target date: 12/21/2021  Pt will be able to perform her normal reaching activities without significant pain or difficulty. Baseline:  Goal status: NOT MET  2.  Pt will be able to perform her ADLs without significant limitation.  Baseline:  Goal status: PROGRESSING  3.  Pt will demo improved L shoulder strength to at least 4/5 MMT t/o for improved tolerance with functional lifting and carrying.   Baseline:  Goal status: ONGOING  4.  Pt will be able to perform her normal household chores and functional lifting around her home without adverse effects.  Baseline:  Goal status:  NOT MET   PLAN: PT FREQUENCY: Will wait until pt has a MRI and sees MD  PT DURATION: 4 weeks  PLANNED INTERVENTIONS: Therapeutic exercises, Therapeutic  activity, Neuromuscular re-education, Patient/Family education, Joint mobilization, Aquatic Therapy, Dry Needling, Electrical stimulation, Spinal mobilization, Cryotherapy, Moist heat, Taping, Ultrasound, and Manual therapy  PLAN FOR NEXT SESSION:   Pt is going to schedule the MRI and then follow up with MD.   Pt to be put on hold until MRI and MD follow up.   Selinda Michaels III PT, DPT 11/24/21 3:13 PM  PHYSICAL THERAPY DISCHARGE SUMMARY  Visits from Start of Care: 6  Current functional level related to goals / functional outcomes: See above for objective findings and goal assessment.    Remaining deficits: See above   Education / Equipment: Pt has a HEP.   Patient was seen in PT from 4/6 - 11/23/2021.  A progress note was completed on her last visit.  Pt was making good progress though had a setback.  See above for assessment.  Pt was planning on scheduling MRI.  She met with MD and is planning on having surgery.  Pt will be discharged from skilled PT services due to having surgery.     Selinda Michaels III PT, DPT 03/20/22 11:04 AM

## 2021-11-24 ENCOUNTER — Encounter (HOSPITAL_BASED_OUTPATIENT_CLINIC_OR_DEPARTMENT_OTHER): Payer: Self-pay | Admitting: Physical Therapy

## 2021-11-26 ENCOUNTER — Ambulatory Visit (HOSPITAL_BASED_OUTPATIENT_CLINIC_OR_DEPARTMENT_OTHER): Payer: Managed Care, Other (non HMO) | Admitting: Physical Therapy

## 2021-11-26 ENCOUNTER — Encounter (HOSPITAL_BASED_OUTPATIENT_CLINIC_OR_DEPARTMENT_OTHER): Payer: Self-pay

## 2021-11-30 ENCOUNTER — Encounter (HOSPITAL_BASED_OUTPATIENT_CLINIC_OR_DEPARTMENT_OTHER): Payer: Managed Care, Other (non HMO) | Admitting: Physical Therapy

## 2021-12-06 ENCOUNTER — Ambulatory Visit (HOSPITAL_BASED_OUTPATIENT_CLINIC_OR_DEPARTMENT_OTHER): Payer: Managed Care, Other (non HMO)

## 2021-12-10 ENCOUNTER — Encounter (HOSPITAL_BASED_OUTPATIENT_CLINIC_OR_DEPARTMENT_OTHER): Payer: Self-pay | Admitting: Orthopaedic Surgery

## 2021-12-17 ENCOUNTER — Ambulatory Visit
Admission: RE | Admit: 2021-12-17 | Discharge: 2021-12-17 | Disposition: A | Discharge status: Home / Self Care | Payer: Managed Care, Other (non HMO) | Source: Ambulatory Visit | Attending: Orthopaedic Surgery | Admitting: Orthopaedic Surgery

## 2021-12-17 DIAGNOSIS — M25512 Pain in left shoulder: Secondary | ICD-10-CM

## 2021-12-21 ENCOUNTER — Ambulatory Visit (HOSPITAL_BASED_OUTPATIENT_CLINIC_OR_DEPARTMENT_OTHER): Payer: Managed Care, Other (non HMO) | Admitting: Orthopaedic Surgery

## 2021-12-21 ENCOUNTER — Other Ambulatory Visit (HOSPITAL_BASED_OUTPATIENT_CLINIC_OR_DEPARTMENT_OTHER): Payer: Self-pay

## 2021-12-21 ENCOUNTER — Ambulatory Visit (HOSPITAL_BASED_OUTPATIENT_CLINIC_OR_DEPARTMENT_OTHER): Payer: Self-pay | Admitting: Orthopaedic Surgery

## 2021-12-21 DIAGNOSIS — M7582 Other shoulder lesions, left shoulder: Secondary | ICD-10-CM | POA: Diagnosis not present

## 2021-12-21 DIAGNOSIS — M25512 Pain in left shoulder: Secondary | ICD-10-CM

## 2021-12-21 MED ORDER — OXYCODONE HCL 5 MG PO TABS
5.0000 mg | ORAL_TABLET | ORAL | 0 refills | Status: DC | PRN
  Filled 2021-12-21: qty 20, 4d supply, fill #0

## 2021-12-21 MED ORDER — ASPIRIN 325 MG PO TBEC
325.0000 mg | DELAYED_RELEASE_TABLET | Freq: Every day | ORAL | 0 refills | Status: DC
  Filled 2021-12-21: qty 30, 30d supply, fill #0

## 2021-12-21 MED ORDER — IBUPROFEN 800 MG PO TABS
800.0000 mg | ORAL_TABLET | Freq: Three times a day (TID) | ORAL | 0 refills | Status: AC
Start: 2021-12-21 — End: 2021-12-31
  Filled 2021-12-21: qty 30, 10d supply, fill #0

## 2021-12-21 MED ORDER — ACETAMINOPHEN 500 MG PO TABS
500.0000 mg | ORAL_TABLET | Freq: Three times a day (TID) | ORAL | 0 refills | Status: AC
  Filled 2021-12-21: qty 30, 10d supply, fill #0

## 2021-12-21 NOTE — Progress Notes (Signed)
Chief Complaint: Left shoulder pain     History of Present Illness:   12/21/2021: Presents today for ongoing treatment of the left shoulder.  Unfortunately she is quite frustrated as she was making significant progress with physical therapy although woke up 1 morning and felt like she was back at square 1.  At this time she is having a very hard time reaching for objects.  She has not been able to swim which is a significant source of enjoyment for her.  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  09/19/21: Presents today for further assessment of her left shoulder.  She has now been at 6 weeks of using her sling for brace as well as resting left shoulder.  There are is persistent weakness and pain in the left shoulder.  She has been working on a home exercise program including using a pulley and active range of motion.  That being said she is still quite limited in having pain by the lateral aspect of the shoulder.  Here today for further assessment.  Rebecca Bonilla is a 46 y.o. female right dominant female presents with left shoulder pain now going on for approximately 2 and half weeks.  She states that she did feel a previous pop around this time just during normal activity and subsequently has had limited overhead activity and motion.  She works as an Pensions consultant.  She is not able to swim which she enjoys doing for exercise.  She has been using an over-the-counter brace which helps.  She is taking Tylenol with little relief.    Surgical History:   None  PMH/PSH/Family History/Social History/Meds/Allergies:    Past Medical History:  Diagnosis Date   Alcohol abuse    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   Hyponatremia 08/15/2020   Macrocytosis 10/29/2020   Portal hypertension (HCC) 08/14/2020   Rotator cuff disorder    Past Surgical History:   Procedure Laterality Date   ANKLE SURGERY     x 2   CHEST TUBE INSERTION Left 06/27/2021   Procedure: CHEST TUBE INSERTION;  Surgeon: Tomma Lightning, MD;  Location: [facility] ENDOSCOPY;  Service: Pulmonary;  Laterality: Left;   PARACENTESIS     THORACENTESIS N/A 06/18/2021   Procedure: Alanson Puls;  Surgeon: [name], MD;  Location: [facility] ENDOSCOPY;  Service: Pulmonary;  Laterality: N/A;   Social History   Socioeconomic History   Marital status: Married    Spouse name: Not on file   Number of children: Not on file   Years of education: Not on file   Highest education level: Not on file  Occupational History   Not on file  Tobacco Use   Smoking status: Former    Packs/day: 0.50    Years: 10.00    Total pack years: 5.00    Types: Cigarettes   Smokeless tobacco: Never  Vaping Use   Vaping Use: Former  Substance and Sexual Activity   Alcohol use: Not Currently   Drug use: Never   Sexual activity: Yes    Birth control/protection: Post-menopausal  Other Topics Concern   Not on file  Social History Narrative   Not on file   Social Determinants of Health  Financial Resource Strain: Not on file  Food Insecurity: Not on file  Transportation Needs: Not on file  Physical Activity: Not on file  Stress: Not on file  Social Connections: Not on file   Family History  Problem Relation Age of Onset   Pancreatic cancer Paternal Grandfather    Skin cancer Father    Huntington's disease Mother    Allergies  Allergen Reactions   Clemastine Rash and Other (See Comments)    Tavist     Hydrocodone Nausea And Vomiting   Other Rash and Other (See Comments)    Albertson's Dayhist-D/generic of Tavist- (reaction to antihistamine that is no longer on the market)   Oxycodone Nausea Only and Other (See Comments)    Nausea to opioids     Current Outpatient Medications  Medication Sig Dispense Refill   acetaminophen (TYLENOL) 500 MG tablet Take 1 tablet (500 mg total) by  mouth every 8 (eight) hours for 10 days. 30 tablet 0   aspirin EC 325 MG tablet Take 1 tablet (325 mg total) by mouth daily. 30 tablet 0   ibuprofen (ADVIL) 800 MG tablet Take 1 tablet (800 mg total) by mouth every 8 (eight) hours for 10 days. Please take with food, please alternate with acetaminophen 30 tablet 0   oxyCODONE (OXY IR/ROXICODONE) 5 MG immediate release tablet Take 1 tablet (5 mg total) by mouth every 4 (four) hours as needed (severe pain). 20 tablet 0   clindamycin (CLINDAGEL) 1 % gel Apply topically 2 (two) times daily. 30 g 11   clobetasol ointment (TEMOVATE) AB-123456789 % Apply 1 application topically 2 (two) times daily as needed (psoriasis- bilateral legs).     furosemide (LASIX) 20 MG tablet Take 20 mg by mouth every morning.     lidocaine (XYLOCAINE) 2 % solution Use as directed 5 mLs in the mouth or throat every 3 (three) hours as needed (tooth pain).     LORazepam (ATIVAN) 0.5 MG tablet Take by mouth.     Potassium Chloride ER 20 MEQ TBCR Take 20 mEq by mouth every morning.     spironolactone (ALDACTONE) 100 MG tablet Take 1 tablet (100 mg total) by mouth daily. 30 tablet 3   triamcinolone cream (KENALOG) 0.1 % Apply 1 application topically daily as needed (psoriasis- bilateral legs).     TYLENOL CHILDRENS CHEWABLES 160 MG CHEW Chew 80 mg by mouth daily as needed (for headaches).     No current facility-administered medications for this visit.   No results found.  Review of Systems:   A ROS was performed including pertinent positives and negatives as documented in the HPI.  Physical Exam :   Constitutional: NAD and appears stated age Neurological: Alert and oriented Psych: Appropriate affect and cooperative There were no vitals taken for this visit.   Comprehensive Musculoskeletal Exam:    Musculoskeletal Exam    Inspection Right Left  Skin No atrophy or winging No atrophy or winging  Palpation    Tenderness None Greater tuberosity  Range of Motion    Flexion  (passive) 170 150 with pain  Flexion (active) 170 100  Abduction 170 100  ER at the side 70 55 with pain  Can reach behind back to T12 L5  Strength     Full Weakness in supraspinatus, negative belly press  Special Tests    Pseudoparalytic No No  Neurologic    Fires PIN, radial, median, ulnar, musculocutaneous, axillary, suprascapular, long thoracic, and spinal accessory innervated muscles. No abnormal sensibility  Vascular/Lymphatic    Radial Pulse 2+ 2+  Cervical Exam    Patient has symmetric cervical range of motion with negative Spurling's test.  Special Test: Positive biceps tendon tenderness with speeds test     Imaging:   Xray (3 views left shoulder): Normal  MRI left shoulder: There is a small full-thickness tear involving the anterior rotator cable of the rotator cuff supraspinatus as well as fluid around the biceps tendon   I personally reviewed and interpreted the radiographs.   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  At this time she is trialed working through physical therapy for multiple months.  She did feel like she is making progress but unfortunately she has recently felt like she return back to square 1.  We did discuss the MRI findings.  She does have a rather small tear involving the anterior portion of the supraspinatus although we did discuss that this is a relatively important area of the rotator cable with regards to her rotator cuff.  We did discuss the possibility of tear progression.  I did discuss that small tears are most frequently the most symptomatic.  At this time we discussed additional treatment options.  I do think that an injection can potentially help her with some pain relief and allow her to work through physical therapy although she has already given physical therapy with very significant trial and believes she is back to square 1.  To that  effect we did discuss arthroscopic intervention.  Specifically I do believe that she would benefit from a left shoulder arthroscopy with rotator cuff repair and biceps tenodesis given her physical exam findings.  After further discussion she is elected to defer an injection and would like to proceed with surgical intervention. Plan :    -Plan for left shoulder arthroscopy with biceps tenodesis   After a lengthy discussion of treatment options, including risks, benefits, alternatives, complications of surgical and nonsurgical conservative options, the patient elected surgical repair.   The patient  is aware of the material risks  and complications including, but not limited to injury to adjacent structures, neurovascular injury, infection, numbness, bleeding, implant failure, thermal burns, stiffness, persistent pain, failure to heal, disease transmission from allograft, need for further surgery, dislocation, anesthetic risks, blood clots, risks of death,and others. The probabilities of surgical success and failure discussed with patient given their particular co-morbidities.The time and nature of expected rehabilitation and recovery was discussed.The patient's questions were all answered preoperatively.  No barriers to understanding were noted. I explained the natural history of the disease process and Rx rationale.  I explained to the patient what I considered to be reasonable expectations given their personal situation.  The final treatment plan was arrived at through a shared patient decision making process model.   I personally saw and evaluated the patient, and participated in the management and treatment plan.  Huel Cote, MD Attending Physician, Orthopedic Surgery  This document was dictated using Dragon voice recognition software. A reasonable attempt at proof reading has been made to minimize errors.

## 2021-12-27 ENCOUNTER — Encounter (HOSPITAL_BASED_OUTPATIENT_CLINIC_OR_DEPARTMENT_OTHER): Payer: Self-pay | Admitting: Orthopaedic Surgery

## 2021-12-27 NOTE — Telephone Encounter (Signed)
Called and spoke to patient.  Can you please reschedule this surgery for September?  Also informed her we can reschedule surgery to September. After verbal discussion with Dr. Steward Drone, informed her she can just take Tylenol post op, and does not need to take ibuprofen.

## 2022-01-01 ENCOUNTER — Other Ambulatory Visit (HOSPITAL_BASED_OUTPATIENT_CLINIC_OR_DEPARTMENT_OTHER): Payer: Self-pay

## 2022-01-28 ENCOUNTER — Encounter (HOSPITAL_BASED_OUTPATIENT_CLINIC_OR_DEPARTMENT_OTHER): Payer: Managed Care, Other (non HMO) | Admitting: Orthopaedic Surgery

## 2022-02-11 ENCOUNTER — Encounter (HOSPITAL_BASED_OUTPATIENT_CLINIC_OR_DEPARTMENT_OTHER): Payer: Self-pay | Admitting: Orthopaedic Surgery

## 2022-03-12 ENCOUNTER — Ambulatory Visit: Admit: 2022-03-12 | Payer: Managed Care, Other (non HMO) | Admitting: Orthopaedic Surgery

## 2022-03-12 SURGERY — SHOULDER ARTHROSCOPY WITH ROTATOR CUFF REPAIR AND OPEN BICEPS TENODESIS
Anesthesia: Regional | Laterality: Left

## 2022-03-15 ENCOUNTER — Ambulatory Visit (HOSPITAL_BASED_OUTPATIENT_CLINIC_OR_DEPARTMENT_OTHER): Payer: Self-pay | Admitting: Physical Therapy

## 2022-03-22 ENCOUNTER — Encounter (HOSPITAL_BASED_OUTPATIENT_CLINIC_OR_DEPARTMENT_OTHER): Payer: Self-pay | Admitting: Physical Therapy

## 2022-03-25 ENCOUNTER — Encounter (HOSPITAL_BASED_OUTPATIENT_CLINIC_OR_DEPARTMENT_OTHER): Payer: Managed Care, Other (non HMO) | Admitting: Orthopaedic Surgery

## 2022-03-29 ENCOUNTER — Encounter (HOSPITAL_BASED_OUTPATIENT_CLINIC_OR_DEPARTMENT_OTHER): Payer: Self-pay | Admitting: Physical Therapy

## 2022-04-18 ENCOUNTER — Other Ambulatory Visit: Payer: Self-pay

## 2022-04-18 ENCOUNTER — Encounter (HOSPITAL_BASED_OUTPATIENT_CLINIC_OR_DEPARTMENT_OTHER): Payer: Self-pay | Admitting: Orthopaedic Surgery

## 2022-04-18 NOTE — Progress Notes (Signed)
   04/18/22 1053  PAT Phone Screen  Do You Have Diabetes? No  Do You Have Hypertension? Yes  Have You Ever Been to the ER for Asthma? No  Have You Taken Oral Steroids in the Past 3 Months? No  Do you Take Phenteramine or any Other Diet Drugs? No  Recent  Lab Work, EKG, CXR? Yes  Where was this test performed? (S)  followed at [facility] for h/o alcohol induced cirrhosis with ascites (2022) in recovery and compliant medications/ therapy. OV 04/09/22 CMET CBC. Stable and to f/u in 5 months.  Do you have a history of heart problems? No  Have You Ever Had Tests on Your Heart? No  Any Recent Hospitalizations? No  Height 5\' 8"  (1.727 m)  Weight 83.9 kg  Pat Appointment Scheduled (S)  Yes (EKG)

## 2022-04-21 NOTE — Therapy (Signed)
OUTPATIENT PHYSICAL THERAPY SHOULDER EVALUATION   Patient Name: Rebecca Bonilla MRN: [id number] DOB:1976-04-18, 46 y.o., female Today's Date: 04/22/2022   PT End of Session - 04/22/22 1014     Visit Number 0             Past Medical History:  Diagnosis Date   Alcohol abuse    Cirrhosis of liver with ascites ([place]) 08/14/2020   Hyponatremia 08/15/2020   Macrocytosis 10/29/2020   Portal hypertension ([place]) 08/14/2020   Rotator cuff disorder    Past Surgical History:  Procedure Laterality Date   ANKLE SURGERY     x 2   CHEST TUBE INSERTION Left 06/27/2021   Procedure: CHEST TUBE INSERTION;  Surgeon: Laurin Coder, MD;  Location: [facility] ENDOSCOPY;  Service: Pulmonary;  Laterality: Left;   PARACENTESIS     THORACENTESIS N/A 06/18/2021   Procedure: Mathews Robinsons;  Surgeon: Freddi Starr, MD;  Location: [facility] ENDOSCOPY;  Service: Pulmonary;  Laterality: N/A;   Patient Active Problem List   Diagnosis Date Noted   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   Recurrent left pleural effusion 06/26/2021   Flank pain 06/09/2021   Hematuria 06/09/2021   Ascites 04/02/2021   Secondary esophageal varices without bleeding ([place]) 12/19/2020   History of colon polyps 12/19/2020   SOB (shortness of breath) 10/30/2020   Pleural effusion 10/29/2020   Macrocytosis 10/29/2020   Hypokalemia 10/29/2020   Hypophosphatemia 10/29/2020   Hypomagnesemia 10/29/2020   Leukocytosis 10/12/2020   Thrombocytopenia ([place]) 10/12/2020   Anemia, chronic disease 09/20/2020   Alcoholism ([place]) 08/25/2020   Hyponatremia 08/15/2020   Cirrhosis of liver with ascites ([place]) 08/14/2020   Portal hypertension ([place]) 41/93/7902   Alcoholic cirrhosis of liver with ascites ([place]) 08/14/2020   Gallbladder sludge 08/14/2020   Psoriasis 08/09/2020      REFERRING PROVIDER: Vanetta Mulders, MD  REFERRING DIAG: M75.82 (ICD-10-CM) - Rotator cuff tendinitis, left  THERAPY DIAG:  Left shoulder pain, unspecified  chronicity  Rationale for Evaluation and Treatment Rehabilitation    SUBJECTIVE:                                                                                                                                                                                      SUBJECTIVE STATEMENT: Pt was seen in PT from April 2023 to May 2023.  Pt was making good progress in PT though had a setback and returned to MD.  Pt had a MRI which showed a full thickness tear of the supraspinatus.  Pt is scheduled for RCR on 04/30/2022.   PT spoke with pt and educated pt on what to expect in PT post-operatively including post op protocol.  There  is no need for PT today due to pt already receiving PT earlier this year.  PT did not perform an evaluation.  PT set up Pt's post-op PT appointments.      Audie Clear III PT, DPT 04/22/22 10:20 AM

## 2022-04-22 ENCOUNTER — Encounter (HOSPITAL_BASED_OUTPATIENT_CLINIC_OR_DEPARTMENT_OTHER): Payer: Self-pay

## 2022-04-22 ENCOUNTER — Ambulatory Visit (HOSPITAL_BASED_OUTPATIENT_CLINIC_OR_DEPARTMENT_OTHER): Payer: Managed Care, Other (non HMO) | Admitting: Physical Therapy

## 2022-04-22 DIAGNOSIS — M25512 Pain in left shoulder: Secondary | ICD-10-CM

## 2022-04-29 ENCOUNTER — Encounter (HOSPITAL_BASED_OUTPATIENT_CLINIC_OR_DEPARTMENT_OTHER)
Admission: RE | Admit: 2022-04-29 | Discharge: 2022-04-29 | Disposition: A | Discharge status: Home / Self Care | Payer: Managed Care, Other (non HMO) | Source: Ambulatory Visit | Attending: Orthopaedic Surgery | Admitting: Orthopaedic Surgery

## 2022-04-29 DIAGNOSIS — I10 Essential (primary) hypertension: Secondary | ICD-10-CM | POA: Diagnosis not present

## 2022-04-29 DIAGNOSIS — M75112 Incomplete rotator cuff tear or rupture of left shoulder, not specified as traumatic: Secondary | ICD-10-CM | POA: Diagnosis present

## 2022-04-29 DIAGNOSIS — K7460 Unspecified cirrhosis of liver: Secondary | ICD-10-CM | POA: Diagnosis not present

## 2022-04-29 DIAGNOSIS — M25812 Other specified joint disorders, left shoulder: Secondary | ICD-10-CM | POA: Diagnosis not present

## 2022-04-29 DIAGNOSIS — Z87891 Personal history of nicotine dependence: Secondary | ICD-10-CM | POA: Diagnosis not present

## 2022-04-29 DIAGNOSIS — M7522 Bicipital tendinitis, left shoulder: Secondary | ICD-10-CM | POA: Diagnosis not present

## 2022-04-29 DIAGNOSIS — M65812 Other synovitis and tenosynovitis, left shoulder: Secondary | ICD-10-CM | POA: Diagnosis not present

## 2022-04-29 NOTE — Anesthesia Preprocedure Evaluation (Addendum)
Anesthesia Evaluation  Patient identified by MRN, date of birth, ID band Patient awake    Reviewed: Allergy & Precautions, NPO status , Patient's Chart, lab work & pertinent test results  Airway Mallampati: II  TM Distance: >3 FB Neck ROM: Full    Dental  (+) Dental Advisory Given, Teeth Intact   Pulmonary former smoker,    Pulmonary exam normal breath sounds clear to auscultation       Cardiovascular negative cardio ROS Normal cardiovascular exam Rhythm:Regular Rate:Normal     Neuro/Psych negative neurological ROS     GI/Hepatic negative GI ROS, (+) Cirrhosis     substance abuse  alcohol use,   Endo/Other  negative endocrine ROS  Renal/GU negative Renal ROS     Musculoskeletal negative musculoskeletal ROS (+)   Abdominal   Peds  Hematology  (+) Blood dyscrasia, anemia ,   Anesthesia Other Findings LEFT SHOULDER ROTATOR CUFF TEAR  Reproductive/Obstetrics                           Anesthesia Physical Anesthesia Plan  ASA: 3  Anesthesia Plan: General   Post-op Pain Management: Tylenol PO (pre-op)*, Celebrex PO (pre-op)* and Regional block*   Induction: Intravenous  PONV Risk Score and Plan: 3 and Ondansetron, Dexamethasone, Treatment may vary due to age or medical condition and Midazolam  Airway Management Planned: Oral ETT  Additional Equipment:   Intra-op Plan:   Post-operative Plan: Extubation in OR  Informed Consent: I have reviewed the patients History and Physical, chart, labs and discussed the procedure including the risks, benefits and alternatives for the proposed anesthesia with the patient or authorized representative who has indicated his/her understanding and acceptance.     Dental advisory given  Plan Discussed with: CRNA  Anesthesia Plan Comments:        Anesthesia Quick Evaluation

## 2022-04-30 ENCOUNTER — Other Ambulatory Visit: Payer: Self-pay

## 2022-04-30 ENCOUNTER — Encounter (HOSPITAL_BASED_OUTPATIENT_CLINIC_OR_DEPARTMENT_OTHER)
Admission: RE | Disposition: A | Discharge status: Home / Self Care | Payer: Self-pay | Source: Home / Self Care | Attending: Orthopaedic Surgery

## 2022-04-30 ENCOUNTER — Ambulatory Visit (HOSPITAL_BASED_OUTPATIENT_CLINIC_OR_DEPARTMENT_OTHER): Payer: Self-pay | Admitting: Orthopaedic Surgery

## 2022-04-30 ENCOUNTER — Ambulatory Visit (HOSPITAL_BASED_OUTPATIENT_CLINIC_OR_DEPARTMENT_OTHER)
Admission: RE | Admit: 2022-04-30 | Discharge: 2022-04-30 | Disposition: A | Discharge status: Home / Self Care | Payer: Managed Care, Other (non HMO) | Attending: Orthopaedic Surgery | Admitting: Orthopaedic Surgery

## 2022-04-30 ENCOUNTER — Ambulatory Visit (HOSPITAL_BASED_OUTPATIENT_CLINIC_OR_DEPARTMENT_OTHER): Payer: Managed Care, Other (non HMO) | Admitting: Certified Registered"

## 2022-04-30 ENCOUNTER — Encounter (HOSPITAL_BASED_OUTPATIENT_CLINIC_OR_DEPARTMENT_OTHER): Payer: Self-pay | Admitting: Orthopaedic Surgery

## 2022-04-30 DIAGNOSIS — M75112 Incomplete rotator cuff tear or rupture of left shoulder, not specified as traumatic: Secondary | ICD-10-CM

## 2022-04-30 DIAGNOSIS — M25512 Pain in left shoulder: Secondary | ICD-10-CM

## 2022-04-30 DIAGNOSIS — I10 Essential (primary) hypertension: Secondary | ICD-10-CM | POA: Insufficient documentation

## 2022-04-30 DIAGNOSIS — M65812 Other synovitis and tenosynovitis, left shoulder: Secondary | ICD-10-CM | POA: Insufficient documentation

## 2022-04-30 DIAGNOSIS — D649 Anemia, unspecified: Secondary | ICD-10-CM | POA: Diagnosis not present

## 2022-04-30 DIAGNOSIS — M75102 Unspecified rotator cuff tear or rupture of left shoulder, not specified as traumatic: Secondary | ICD-10-CM

## 2022-04-30 DIAGNOSIS — M7582 Other shoulder lesions, left shoulder: Secondary | ICD-10-CM

## 2022-04-30 DIAGNOSIS — M7522 Bicipital tendinitis, left shoulder: Secondary | ICD-10-CM | POA: Insufficient documentation

## 2022-04-30 DIAGNOSIS — M7542 Impingement syndrome of left shoulder: Secondary | ICD-10-CM

## 2022-04-30 DIAGNOSIS — Z87891 Personal history of nicotine dependence: Secondary | ICD-10-CM | POA: Insufficient documentation

## 2022-04-30 DIAGNOSIS — M25812 Other specified joint disorders, left shoulder: Secondary | ICD-10-CM | POA: Insufficient documentation

## 2022-04-30 DIAGNOSIS — K7460 Unspecified cirrhosis of liver: Secondary | ICD-10-CM | POA: Insufficient documentation

## 2022-04-30 HISTORY — PX: SHOULDER ARTHROSCOPY WITH SUBACROMIAL DECOMPRESSION: SHX5684

## 2022-04-30 HISTORY — PX: BICEPT TENODESIS: SHX5116

## 2022-04-30 SURGERY — SHOULDER ARTHROSCOPY WITH SUBACROMIAL DECOMPRESSION
Anesthesia: General | Site: Shoulder | Laterality: Left

## 2022-04-30 MED ORDER — GABAPENTIN 300 MG PO CAPS
300.0000 mg | ORAL_CAPSULE | Freq: Once | ORAL | Status: AC
  Administered 2022-04-30: 300 mg via ORAL

## 2022-04-30 MED ORDER — FENTANYL CITRATE (PF) 100 MCG/2ML IJ SOLN
INTRAMUSCULAR | Status: AC
  Filled 2022-04-30: qty 2

## 2022-04-30 MED ORDER — AMISULPRIDE (ANTIEMETIC) 5 MG/2ML IV SOLN
INTRAVENOUS | Status: AC
  Filled 2022-04-30: qty 2

## 2022-04-30 MED ORDER — CEFAZOLIN SODIUM-DEXTROSE 2-4 GM/100ML-% IV SOLN
INTRAVENOUS | Status: AC
  Filled 2022-04-30: qty 100

## 2022-04-30 MED ORDER — CELECOXIB 200 MG PO CAPS
ORAL_CAPSULE | ORAL | Status: AC
  Filled 2022-04-30: qty 1

## 2022-04-30 MED ORDER — BUPIVACAINE LIPOSOME 1.3 % IJ SUSP
INTRAMUSCULAR | Status: DC | PRN
  Administered 2022-04-30: 10 mL via PERINEURAL

## 2022-04-30 MED ORDER — PROMETHAZINE HCL 25 MG/ML IJ SOLN
INTRAMUSCULAR | Status: AC
  Filled 2022-04-30: qty 1

## 2022-04-30 MED ORDER — CELECOXIB 200 MG PO CAPS: 200.0000 mg | ORAL_CAPSULE | Freq: Once | ORAL | Status: DC

## 2022-04-30 MED ORDER — ONDANSETRON HCL 4 MG/2ML IJ SOLN
INTRAMUSCULAR | Status: AC
  Filled 2022-04-30: qty 2

## 2022-04-30 MED ORDER — DEXAMETHASONE SODIUM PHOSPHATE 10 MG/ML IJ SOLN
INTRAMUSCULAR | Status: AC
  Filled 2022-04-30: qty 1

## 2022-04-30 MED ORDER — FENTANYL CITRATE (PF) 100 MCG/2ML IJ SOLN
50.0000 ug | Freq: Once | INTRAMUSCULAR | Status: AC
  Administered 2022-04-30: 50 ug via INTRAVENOUS

## 2022-04-30 MED ORDER — FENTANYL CITRATE (PF) 100 MCG/2ML IJ SOLN: 25.0000 ug | INTRAMUSCULAR | Status: DC | PRN

## 2022-04-30 MED ORDER — ATROPINE SULFATE 0.4 MG/ML IV SOLN
INTRAVENOUS | Status: AC
  Filled 2022-04-30: qty 1

## 2022-04-30 MED ORDER — TRANEXAMIC ACID-NACL 1000-0.7 MG/100ML-% IV SOLN
INTRAVENOUS | Status: AC
  Filled 2022-04-30: qty 100

## 2022-04-30 MED ORDER — LIDOCAINE 2% (20 MG/ML) 5 ML SYRINGE
INTRAMUSCULAR | Status: DC | PRN
  Administered 2022-04-30: 80 mg via INTRAVENOUS

## 2022-04-30 MED ORDER — PROPOFOL 500 MG/50ML IV EMUL
INTRAVENOUS | Status: AC
  Filled 2022-04-30: qty 100

## 2022-04-30 MED ORDER — GABAPENTIN 300 MG PO CAPS
ORAL_CAPSULE | ORAL | Status: AC
  Filled 2022-04-30: qty 1

## 2022-04-30 MED ORDER — ACETAMINOPHEN 500 MG PO TABS
ORAL_TABLET | ORAL | Status: AC
  Filled 2022-04-30: qty 2

## 2022-04-30 MED ORDER — BUPIVACAINE HCL (PF) 0.25 % IJ SOLN
INTRAMUSCULAR | Status: AC
  Filled 2022-04-30: qty 60

## 2022-04-30 MED ORDER — LACTATED RINGERS IV SOLN: INTRAVENOUS | Status: DC

## 2022-04-30 MED ORDER — PHENYLEPHRINE HCL (PRESSORS) 10 MG/ML IV SOLN
INTRAVENOUS | Status: AC
  Filled 2022-04-30: qty 1

## 2022-04-30 MED ORDER — MIDAZOLAM HCL 2 MG/2ML IJ SOLN
INTRAMUSCULAR | Status: AC
  Filled 2022-04-30: qty 2

## 2022-04-30 MED ORDER — DEXAMETHASONE SODIUM PHOSPHATE 10 MG/ML IJ SOLN
INTRAMUSCULAR | Status: DC | PRN
  Administered 2022-04-30: 10 mg via INTRAVENOUS

## 2022-04-30 MED ORDER — ROCURONIUM BROMIDE 100 MG/10ML IV SOLN
INTRAVENOUS | Status: DC | PRN
  Administered 2022-04-30: 50 mg via INTRAVENOUS

## 2022-04-30 MED ORDER — ONDANSETRON HCL 4 MG/2ML IJ SOLN
INTRAMUSCULAR | Status: DC | PRN
  Administered 2022-04-30: 4 mg via INTRAVENOUS

## 2022-04-30 MED ORDER — PROMETHAZINE HCL 25 MG/ML IJ SOLN
6.2500 mg | INTRAMUSCULAR | Status: DC | PRN
  Administered 2022-04-30: 12.5 mg via INTRAVENOUS

## 2022-04-30 MED ORDER — ACETAMINOPHEN 500 MG PO TABS: 1000.0000 mg | ORAL_TABLET | Freq: Once | ORAL | Status: DC

## 2022-04-30 MED ORDER — ROCURONIUM BROMIDE 10 MG/ML (PF) SYRINGE
PREFILLED_SYRINGE | INTRAVENOUS | Status: AC
  Filled 2022-04-30: qty 10

## 2022-04-30 MED ORDER — PROPOFOL 10 MG/ML IV BOLUS
INTRAVENOUS | Status: AC
  Filled 2022-04-30: qty 20

## 2022-04-30 MED ORDER — EPINEPHRINE PF 1 MG/ML IJ SOLN
INTRAMUSCULAR | Status: AC
  Filled 2022-04-30: qty 4

## 2022-04-30 MED ORDER — AMISULPRIDE (ANTIEMETIC) 5 MG/2ML IV SOLN
10.0000 mg | Freq: Once | INTRAVENOUS | Status: AC | PRN
  Administered 2022-04-30: 10 mg via INTRAVENOUS

## 2022-04-30 MED ORDER — TRANEXAMIC ACID-NACL 1000-0.7 MG/100ML-% IV SOLN
1000.0000 mg | INTRAVENOUS | Status: AC
  Administered 2022-04-30: 1000 mg via INTRAVENOUS

## 2022-04-30 MED ORDER — MEPERIDINE HCL 25 MG/ML IJ SOLN: 6.2500 mg | INTRAMUSCULAR | Status: DC | PRN

## 2022-04-30 MED ORDER — BUPIVACAINE HCL (PF) 0.5 % IJ SOLN
INTRAMUSCULAR | Status: DC | PRN
  Administered 2022-04-30: 10 mL via PERINEURAL

## 2022-04-30 MED ORDER — CEFAZOLIN SODIUM-DEXTROSE 2-4 GM/100ML-% IV SOLN
2.0000 g | INTRAVENOUS | Status: AC
  Administered 2022-04-30: 2 g via INTRAVENOUS

## 2022-04-30 MED ORDER — SODIUM CHLORIDE 0.9 % IR SOLN
Status: DC | PRN
  Administered 2022-04-30: 12000 mL

## 2022-04-30 MED ORDER — LIDOCAINE 2% (20 MG/ML) 5 ML SYRINGE
INTRAMUSCULAR | Status: AC
  Filled 2022-04-30: qty 5

## 2022-04-30 MED ORDER — PHENYLEPHRINE HCL (PRESSORS) 10 MG/ML IV SOLN
INTRAVENOUS | Status: DC | PRN
  Administered 2022-04-30 (×6): 80 ug via INTRAVENOUS

## 2022-04-30 MED ORDER — FENTANYL CITRATE (PF) 100 MCG/2ML IJ SOLN
INTRAMUSCULAR | Status: DC | PRN
  Administered 2022-04-30 (×2): 25 ug via INTRAVENOUS
  Administered 2022-04-30: 50 ug via INTRAVENOUS

## 2022-04-30 MED ORDER — PHENYLEPHRINE 80 MCG/ML (10ML) SYRINGE FOR IV PUSH (FOR BLOOD PRESSURE SUPPORT)
PREFILLED_SYRINGE | INTRAVENOUS | Status: AC
  Filled 2022-04-30: qty 10

## 2022-04-30 MED ORDER — SUGAMMADEX SODIUM 200 MG/2ML IV SOLN
INTRAVENOUS | Status: DC | PRN
  Administered 2022-04-30: 200 mg via INTRAVENOUS

## 2022-04-30 MED ORDER — MIDAZOLAM HCL 2 MG/2ML IJ SOLN
2.0000 mg | Freq: Once | INTRAMUSCULAR | Status: AC
  Administered 2022-04-30: 2 mg via INTRAVENOUS

## 2022-04-30 MED ORDER — PROPOFOL 10 MG/ML IV BOLUS
INTRAVENOUS | Status: DC | PRN
  Administered 2022-04-30: 150 mg via INTRAVENOUS

## 2022-04-30 MED ORDER — SODIUM CHLORIDE 0.9 % IR SOLN
Status: DC | PRN
  Administered 2022-04-30: 9000 mL

## 2022-04-30 SURGICAL SUPPLY — 82 items
AID PSTN UNV HD RSTRNT DISP (MISCELLANEOUS) ×3
ANCH SUT KNTLS STRL SHLDR SYS (Anchor) ×3 IMPLANT
ANCHOR SUT QUATTRO KNTLS 4.5 (Anchor) ×1 IMPLANT
APL PRP STRL LF DISP 70% ISPRP (MISCELLANEOUS) ×6
APL SKNCLS STERI-STRIP NONHPOA (GAUZE/BANDAGES/DRESSINGS)
BENZOIN TINCTURE PRP APPL 2/3 (GAUZE/BANDAGES/DRESSINGS) IMPLANT
BLADE EXCALIBUR 4.0X13 (MISCELLANEOUS) ×3 IMPLANT
BLADE SURG 15 STRL LF DISP TIS (BLADE) IMPLANT
BLADE SURG 15 STRL SS (BLADE)
BURR OVAL 8 FLU 4.0X13 (MISCELLANEOUS) ×1 IMPLANT
CANNULA 5.75X71 LONG (CANNULA) IMPLANT
CANNULA 7X7 TWIST-IN (CANNULA) IMPLANT
CANNULA PASSPORT 5 (CANNULA) IMPLANT
CANNULA PASSPORT BUTTON 10-40 (CANNULA) ×1 IMPLANT
CANNULA TWIST IN 8.25X7CM (CANNULA) ×1 IMPLANT
CHLORAPREP W/TINT 26 (MISCELLANEOUS) ×6 IMPLANT
CLSR STERI-STRIP ANTIMIC 1/2X4 (GAUZE/BANDAGES/DRESSINGS) IMPLANT
COOLER ICEMAN CLASSIC (MISCELLANEOUS) ×3 IMPLANT
DRAPE IMP U-DRAPE 54X76 (DRAPES) ×3 IMPLANT
DRAPE INCISE IOBAN 66X45 STRL (DRAPES) ×3 IMPLANT
DRAPE SHOULDER BEACH CHAIR (DRAPES) ×3 IMPLANT
DRAPE U-SHAPE 47X51 STRL (DRAPES) ×6 IMPLANT
DW OUTFLOW CASSETTE/TUBE SET (MISCELLANEOUS) ×3 IMPLANT
ELECT REM PT RETURN 9FT ADLT (ELECTROSURGICAL)
ELECTRODE REM PT RTRN 9FT ADLT (ELECTROSURGICAL) ×2 IMPLANT
GAUZE PAD ABD 8X10 STRL (GAUZE/BANDAGES/DRESSINGS) ×3 IMPLANT
GAUZE SPONGE 4X4 12PLY STRL (GAUZE/BANDAGES/DRESSINGS) ×3 IMPLANT
GAUZE XEROFORM 1X8 LF (GAUZE/BANDAGES/DRESSINGS) ×3 IMPLANT
GLOVE BIO SURGEON STRL SZ 6 (GLOVE) ×6 IMPLANT
GLOVE BIO SURGEON STRL SZ7.5 (GLOVE) ×4 IMPLANT
GLOVE BIOGEL PI IND STRL 6.5 (GLOVE) ×3 IMPLANT
GLOVE BIOGEL PI IND STRL 8 (GLOVE) ×3 IMPLANT
GLOVE ECLIPSE 8.0 STRL XLNG CF (GLOVE) ×2 IMPLANT
GLOVE SURG SYN 7.5  E (GLOVE)
GLOVE SURG SYN 7.5 E (GLOVE) IMPLANT
GLOVE SURG SYN 7.5 PF PI (GLOVE) ×2 IMPLANT
GOWN STRL REUS W/ TWL LRG LVL3 (GOWN DISPOSABLE) ×5 IMPLANT
GOWN STRL REUS W/ TWL XL LVL3 (GOWN DISPOSABLE) ×3 IMPLANT
GOWN STRL REUS W/TWL LRG LVL3 (GOWN DISPOSABLE) ×3
GOWN STRL REUS W/TWL XL LVL3 (GOWN DISPOSABLE) ×6 IMPLANT
KIT STABILIZATION SHOULDER (MISCELLANEOUS) ×3 IMPLANT
KIT STR SPEAR 1.8 FBRTK DISP (KITS) IMPLANT
LASSO 90 CVE QUICKPAS (DISPOSABLE) IMPLANT
LASSO CRESCENT QUICKPASS (SUTURE) IMPLANT
LOOP 2 FIBERLINK CLOSED (SUTURE) IMPLANT
MANIFOLD NEPTUNE II (INSTRUMENTS) ×3 IMPLANT
NDL SAFETY ECLIP 18X1.5 (MISCELLANEOUS) ×3 IMPLANT
NDL SCORPION MULTI FIRE (NEEDLE) IMPLANT
NDL SUT PASSER RTC (NEEDLE) IMPLANT
NEEDLE SCORPION MULTI FIRE (NEEDLE) IMPLANT
NEEDLE SUT PASSER RTC (NEEDLE) ×3 IMPLANT
PACK ARTHROSCOPY DSU (CUSTOM PROCEDURE TRAY) ×3 IMPLANT
PACK BASIN DAY SURGERY FS (CUSTOM PROCEDURE TRAY) ×3 IMPLANT
PAD COLD SHLDR WRAP-ON (PAD) ×3 IMPLANT
PENCIL SMOKE EVACUATOR (MISCELLANEOUS) IMPLANT
PORT APPOLLO RF 90DEGREE MULTI (SURGICAL WAND) ×3 IMPLANT
RESTRAINT HEAD UNIVERSAL NS (MISCELLANEOUS) ×3 IMPLANT
SHEET MEDIUM DRAPE 40X70 STRL (DRAPES) ×1 IMPLANT
SLEEVE SCD COMPRESS KNEE MED (STOCKING) ×3 IMPLANT
SPONGE T-LAP 4X18 ~~LOC~~+RFID (SPONGE) ×2 IMPLANT
SUT BROADBAND TAPE 2PK 1.5 (SUTURE) ×1 IMPLANT
SUT ETHILON 3 0 PS 1 (SUTURE) ×3 IMPLANT
SUT FIBERWIRE #2 38 T-5 BLUE (SUTURE)
SUT MON AB 4-0 PS1 27 (SUTURE) IMPLANT
SUT PDS AB 1 CT  36 (SUTURE)
SUT PDS AB 1 CT 36 (SUTURE) IMPLANT
SUT TIGER TAPE 7 IN WHITE (SUTURE) IMPLANT
SUT VIC AB 0 CT1 27 (SUTURE)
SUT VIC AB 0 CT1 27XBRD ANBCTR (SUTURE) IMPLANT
SUT VIC AB 2-0 SH 27 (SUTURE)
SUT VIC AB 2-0 SH 27XBRD (SUTURE) IMPLANT
SUTURE FIBERWR #2 38 T-5 BLUE (SUTURE) IMPLANT
SUTURE TAPE 1.3 40 TPR END (SUTURE) IMPLANT
SUTURE TAPE TIGERLINK 1.3MM BL (SUTURE) IMPLANT
SUTURETAPE 1.3 40 TPR END (SUTURE)
SUTURETAPE TIGERLINK 1.3MM BL (SUTURE)
SYR 5ML LL (SYRINGE) ×3 IMPLANT
TAPE FIBER 2MM 7IN #2 BLUE (SUTURE) IMPLANT
TOWEL GREEN STERILE FF (TOWEL DISPOSABLE) ×6 IMPLANT
TUBE CONNECTING 20X1/4 (TUBING) ×3 IMPLANT
TUBING ARTHROSCOPY IRRIG 16FT (MISCELLANEOUS) ×3 IMPLANT
YANKAUER SUCT BULB TIP NO VENT (SUCTIONS) IMPLANT

## 2022-04-30 NOTE — Interval H&P Note (Signed)
History and Physical Interval Note:  04/30/2022 7:05 AM  Rebecca Bonilla  has presented today for surgery, with the diagnosis of [place].  The various methods of treatment have been discussed with the patient and family. After consideration of risks, benefits and other options for treatment, the patient has consented to  Procedure(s): LEFT SHOULDER ARTHROSCOPY WITH ROTATOR CUFF REPAIR AND OPEN BICEPS TENODESIS (Left) as a surgical intervention.  The patient's history has been reviewed, patient examined, no change in status, stable for surgery.  I have reviewed the patient's chart and labs.  Questions were answered to the patient's satisfaction.     Vanetta Mulders

## 2022-04-30 NOTE — Discharge Instructions (Addendum)
Discharge Instructions    Attending Surgeon: Huel Cote, MD Office Phone Number: [phone]   Diagnosis and Procedures:    Surgeries Performed: Left shoulder biceps tenodesis, rotator cuff debridement  Discharge Plan:    Diet: Resume usual diet. Begin with light or bland foods.  Drink plenty of fluids.  Activity:  Keep sling and dressing in place until your follow up visit in Physical Therapy You are advised to go home directly from the hospital or surgical center. Restrict your activities.  GENERAL INSTRUCTIONS: 1.  Keep your surgical site elevated above your heart for at least 5-7 days or longer to prevent swelling. This will improve your comfort and your overall recovery following surgery.     2. Please call Dr. Serena Croissant office at [phone] with questions Monday-Friday during business hours. If no one answers, please leave a message and someone should get back to the patient within 24 hours. For emergencies please call 911 or proceed to the emergency room.   3. Patient to notify surgical team if experiences any of the following: Bowel/Bladder dysfunction, uncontrolled pain, nerve/muscle weakness, incision with increased drainage or redness, nausea/vomiting and Fever greater than 101.0 F.  Be alert for signs of infection including redness, streaking, odor, fever or chills. Be alert for excessive pain or bleeding and notify your surgeon immediately.  WOUND INSTRUCTIONS:   Leave your dressing/cast/splint in place until your post operative visit.  Keep it clean and dry.  Always keep the incision clean and dry until the staples/sutures are removed. If there is no drainage from the incision you should keep it open to air. If there is drainage from the incision you must keep it covered at all times until the drainage stops  Do not soak in a bath tub, hot tub, pool, lake or other body of water until 21 days after your surgery and your incision is completely dry and  healed.  If you have removable sutures (or staples) they must be removed 10-14 days (unless otherwise instructed) from the day of your surgery.     1)  Elevate the extremity as much as possible.  2)  Keep the dressing clean and dry.  3)  Please call us if the dressing becomes wet or dirty.  4)  If you are experiencing worsening pain or worsening swelling, please call.     MEDICATIONS: Resume all previous home medications at the previous prescribed dose and frequency unless otherwise noted Start taking the  pain medications on an as-needed basis as prescribed  Please taper down pain medication over the next week following surgery.  Ideally you should not require a refill of any narcotic pain medication.  Take pain medication with food to minimize nausea. In addition to the prescribed pain medication, you may take over-the-counter pain relievers such as Tylenol.  Do NOT take additional tylenol if your pain medication already has tylenol in it.  Aspirin 325mg  daily for four weeks.      FOLLOWUP INSTRUCTIONS: 1. Follow up at the Physical Therapy Clinic 3-4 days following surgery. This appointment should be scheduled unless other arrangements have been made.The Physical Therapy scheduling number is [phone] if an appointment has not already been arranged.  2. Contact Dr. [phone] office during office hours at [phone] or the practice after hours line at [phone] for non-emergencies. For medical emergencies call 911.   Discharge Location: Home    Post Anesthesia Home Care Instructions  Activity: Get plenty of rest for the remainder of  the day. A responsible individual must stay with you for 24 hours following the procedure.  For the next 24 hours, DO NOT: -Drive a car -Paediatric nurse -Drink alcoholic beverages -Take any medication unless instructed by your physician -Make any legal decisions or sign important papers.  Meals: Start with liquid foods such as gelatin  or soup. Progress to regular foods as tolerated. Avoid greasy, spicy, heavy foods. If nausea and/or vomiting occur, drink only clear liquids until the nausea and/or vomiting subsides. Call your physician if vomiting continues.  Special Instructions/Symptoms: Your throat may feel dry or sore from the anesthesia or the breathing tube placed in your throat during surgery. If this causes discomfort, gargle with warm salt water. The discomfort should disappear within 24 hours.  If you had a scopolamine patch placed behind your ear for the management of post- operative nausea and/or vomiting:  1. The medication in the patch is effective for 72 hours, after which it should be removed.  Wrap patch in a tissue and discard in the trash. Wash hands thoroughly with soap and water. 2. You may remove the patch earlier than 72 hours if you experience unpleasant side effects which may include dry mouth, dizziness or visual disturbances. 3. Avoid touching the patch. Wash your hands with soap and water after contact with the patch.     Regional Anesthesia Blocks  1. Numbness or the inability to move the "blocked" extremity may last from 3-48 hours after placement. The length of time depends on the medication injected and your individual response to the medication. If the numbness is not going away after 48 hours, call your surgeon.  2. The extremity that is blocked will need to be protected until the numbness is gone and the  Strength has returned. Because you cannot feel it, you will need to take extra care to avoid injury. Because it may be weak, you may have difficulty moving it or using it. You may not know what position it is in without looking at it while the block is in effect.  3. For blocks in the legs and feet, returning to weight bearing and walking needs to be done carefully. You will need to wait until the numbness is entirely gone and the strength has returned. You should be able to move your leg and  foot normally before you try and bear weight or walk. You will need someone to be with you when you first try to ensure you do not fall and possibly risk injury.  4. Bruising and tenderness at the needle site are common side effects and will resolve in a few days.  5. Persistent numbness or new problems with movement should be communicated to the surgeon or the [place] [phone] Welcome [phone]).  Donjoy Ultrasling III ([place]):  Please contact your surgeon if you have questions or concerns about your sling.  Donjoy Ultrasling III (Red ball):  Please contact your surgeon if you have questions or concerns about your sling.

## 2022-04-30 NOTE — Anesthesia Procedure Notes (Signed)
Procedure Name: Intubation Date/Time: 04/30/2022 7:40 AM  Performed by: Lavonia Dana, CRNAPre-anesthesia Checklist: Patient identified, Emergency Drugs available, Suction available and Patient being monitored Patient Re-evaluated:Patient Re-evaluated prior to induction Oxygen Delivery Method: Circle system utilized Preoxygenation: Pre-oxygenation with 100% oxygen Induction Type: IV induction Ventilation: Mask ventilation without difficulty Laryngoscope Size: Mac and 3 Grade View: Grade I Tube type: Oral Tube size: 7.0 mm Number of attempts: 1 Airway Equipment and Method: Stylet and Bite block Placement Confirmation: ETT inserted through vocal cords under direct vision, positive ETCO2 and breath sounds checked- equal and bilateral Secured at: 22 cm Tube secured with: Tape Dental Injury: Teeth and Oropharynx as per pre-operative assessment

## 2022-04-30 NOTE — Progress Notes (Signed)
Assisted Dr. Germeroth with left, interscalene , ultrasound guided block. Side rails up, monitors on throughout procedure. See vital signs in flow sheet. Tolerated Procedure well. 

## 2022-04-30 NOTE — Op Note (Signed)
Date of Surgery: 04/30/2022  INDICATIONS: Ms. Rebecca Bonilla is a 46 y.o.-year-old female with a symptomatic left shoulder partial rotator cuff tear and biceps tendinitis which is failed conservative management.  The risk and benefits of the procedure were discussed in detail and documented in the pre-operative evaluation.   PREOPERATIVE DIAGNOSES: Left shoulder, chronic rotator cuff tear, biceps tendinitis, and subacromial impingement.  POSTOPERATIVE DIAGNOSIS: Same.  PROCEDURE: Arthroscopic extensive debridement - 29823 Supraspinatus Tendon, Anterior Labrum, Superior Labrum, and Posterior Labrum Arthroscopic subacromial decompression - 16073 Arthroscopic biceps tenodesis - 71062  SURGEON: Benancio Deeds MD  ASSISTANT: Kerby Less, ATC  ANESTHESIA:  general plus interscalene nerve block  IV FLUIDS AND URINE: See anesthesia record.  ANTIBIOTICS: Ancef  ESTIMATED BLOOD LOSS: 10 mL.  IMPLANTS:  Implant Name Type Inv. Item Serial No. Manufacturer Lot No. LRB No. Used Action  ANCHOR SUT QUATTRO KNTLS 4.5 - [id number] Anchor ANCHOR SUT QUATTRO KNTLS 4.5  [employer](ORTH,TRAU,BIO,SG) [id number] Left 1 Implanted    DRAINS: None  CULTURES: None  COMPLICATIONS: none  PROCEDURE:    OPERATIVE FINDING: Exam under anesthesia:   Examination under anesthesia revealed forward elevation of 150 degrees.  With the arm at the side, there was 65 degrees of external rotation.  There is a 1+ anterior load shift and a 1+ posterior load shift.    Arthroscopic findings demonstrated: Articular space: Normal Chondral surfaces: Normal Biceps: Insertional redness Subscapularis: Intact Supraspinatus: Unders surface tearing involving 5% of the medial footprint particularly Infraspinatus: Intact    I identified the patient in the pre-operative holding area.  I marked the operative right shoulder with my initials. I reviewed the risks and benefits of the proposed surgical intervention and the  patient wished to proceed.  Anesthesia was then performed with regional block.  The patient was transferred to the operative suite and placed in the beach chair position with all bony prominences padded.     SCDs were placed on bilateral lower extremity. Appropriate antibiotics was administered within 1 hour before incision.  Anesthesia was induced.  The operative extremity was then prepped and draped in standard fashion. A time out was performed confirming the correct extremity, correct patient and correct procedure.   The arthroscope was introduced in the glenohumeral joint from a posterior portal.  An anterior portal was created.  The shoulder was examined and the above findings were noted.     With an arthroscopic shaver and a wand ablator, synovitis throughout the  shoulder was resected.  The arthroscopic shaver was used to excise torn portions of the labrum back to a stable margin. Specifically this was done for the anterior superior and posterior labrum and a rotator interval debridement was performed.   At this time the biceps was tagged with a self passing device with a #2 nonabsorbable suture.  This was done twice in a luggage tag fashion.  This was done through the anterior portal.  These were then both placed through a 4.5 mm Quatro anchor into the native biceps groove right superior to the subscapularis.  At this time a shaver was introduced over the biceps tendon and debridement of the undersurface of the supraspinatus tear was performed.   The rotator cuff was then approached through the subacromial space.  This was found to be well intact through the lateral viewing portal and the bursal sided space.  A combination of shaver and wand was introduced and to perform a soft tissue bursectomy including the undersurface of the acromion.  The shoulder  was irrigated.  The arthroscopic instruments were removed.  Wounds were closed with 3-0 nylon sutures.  A sterile dressing was applied with  xeroform, 4x8s, abdominal pad, and tape. An Gar Gibbon was placed and the upper extremity was placed in a shoulder immobilizer.  The patient tolerated the procedure well and was taken to the recovery room in stable condition.  All counts were correct in the case. The patient tolerated the procedure well and was taken to the recovery room in stable condition.    POSTOPERATIVE PLAN: She will begin active range of motion at 2 weeks.  At this time she will plan for early and aggressive active range of motion without any rotator cuff restrictions.  She will follow the biceps tendon protocol.  She was placed on aspirin for DVT prophylaxis  Yevonne Pax, MD 9:30 AM

## 2022-04-30 NOTE — H&P (Signed)
Chief Complaint: Left shoulder pain        History of Present Illness:    12/21/2021: Presents today for ongoing treatment of the left shoulder.  Unfortunately she is quite frustrated as she was making significant progress with physical therapy although woke up 1 morning and felt like she was back at square 1.  At this time she is having a very hard time reaching for objects.  She has not been able to swim which is a significant source of enjoyment for her.  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   09/19/21: Presents today for further assessment of her left shoulder.  She has now been at 6 weeks of using her sling for brace as well as resting left shoulder.  There are is persistent weakness and pain in the left shoulder.  She has been working on a home exercise program including using a pulley and active range of motion.  That being said she is still quite limited in having pain by the lateral aspect of the shoulder.  Here today for further assessment.   Rebecca Bonilla is a 46 y.o. female right dominant female presents with left shoulder pain now going on for approximately 2 and half weeks.  She states that she did feel a previous pop around this time just during normal activity and subsequently has had limited overhead activity and motion.  She works as an Pensions consultant.  She is not able to swim which she enjoys doing for exercise.  She has been using an over-the-counter brace which helps.  She is taking Tylenol with little relief.       Surgical History:   None   PMH/PSH/Family History/Social History/Meds/Allergies:         Past Medical History:  Diagnosis Date   Alcohol abuse     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   Hyponatremia 08/15/2020   Macrocytosis 10/29/2020   Portal hypertension (HCC) 08/14/2020   Rotator cuff disorder           Past Surgical History:   Procedure Laterality Date   ANKLE SURGERY        x 2   CHEST TUBE INSERTION Left 06/27/2021    Procedure: CHEST TUBE INSERTION;  Surgeon: Tomma Lightning, MD;  Location: [facility] ENDOSCOPY;  Service: Pulmonary;  Laterality: Left;   PARACENTESIS       THORACENTESIS N/A 06/18/2021    Procedure: Alanson Puls;  Surgeon: [name], MD;  [facility] ENDOSCOPY;  Service: Pulmonary;  Laterality: N/A;    Social History         Socioeconomic History   Marital status: Married      Spouse name: Not on file   Number of children: Not on file   Years of education: Not on file   Highest education level: Not on file  Occupational History   Not on file  Tobacco Use   Smoking status: Former      Packs/day: 0.50      Years: 10.00      Total pack years: 5.00      Types: Cigarettes   Smokeless tobacco: Never  Vaping Use   Vaping Use: Former  Substance and Sexual Activity   Alcohol use: Not Currently   Drug use: Never   Sexual activity: Yes      Birth control/protection: Post-menopausal  Other Topics Concern  Not on file  Social History Narrative   Not on file    Social Determinants of Health    Financial Resource Strain: Not on file  Food Insecurity: Not on file  Transportation Needs: Not on file  Physical Activity: Not on file  Stress: Not on file  Social Connections: Not on file         Family History  Problem Relation Age of Onset   Pancreatic cancer Paternal Grandfather     Skin cancer Father     Huntington's disease Mother           Allergies  Allergen Reactions   Clemastine Rash and Other (See Comments)      Tavist       Hydrocodone Nausea And Vomiting   Other Rash and Other (See Comments)      Albertson's Dayhist-D/generic of Tavist- (reaction to antihistamine that is no longer on the market)   Oxycodone Nausea Only and Other (See Comments)      Nausea to opioids              Current Outpatient Medications  Medication Sig Dispense Refill    acetaminophen (TYLENOL) 500 MG tablet Take 1 tablet (500 mg total) by mouth every 8 (eight) hours for 10 days. 30 tablet 0   aspirin EC 325 MG tablet Take 1 tablet (325 mg total) by mouth daily. 30 tablet 0   ibuprofen (ADVIL) 800 MG tablet Take 1 tablet (800 mg total) by mouth every 8 (eight) hours for 10 days. Please take with food, please alternate with acetaminophen 30 tablet 0   oxyCODONE (OXY IR/ROXICODONE) 5 MG immediate release tablet Take 1 tablet (5 mg total) by mouth every 4 (four) hours as needed (severe pain). 20 tablet 0   clindamycin (CLINDAGEL) 1 % gel Apply topically 2 (two) times daily. 30 g 11   clobetasol ointment (TEMOVATE) 0.05 % Apply 1 application topically 2 (two) times daily as needed (psoriasis- bilateral legs).       furosemide (LASIX) 20 MG tablet Take 20 mg by mouth every morning.       lidocaine (XYLOCAINE) 2 % solution Use as directed 5 mLs in the mouth or throat every 3 (three) hours as needed (tooth pain).       LORazepam (ATIVAN) 0.5 MG tablet Take by mouth.       Potassium Chloride ER 20 MEQ TBCR Take 20 mEq by mouth every morning.       spironolactone (ALDACTONE) 100 MG tablet Take 1 tablet (100 mg total) by mouth daily. 30 tablet 3   triamcinolone cream (KENALOG) 0.1 % Apply 1 application topically daily as needed (psoriasis- bilateral legs).       TYLENOL CHILDRENS CHEWABLES 160 MG CHEW Chew 80 mg by mouth daily as needed (for headaches).        No current facility-administered medications for this visit.    Imaging Results (Last 48 hours)  No results found.     Review of Systems:   A ROS was performed including pertinent positives and negatives as documented in the HPI.   Physical Exam :   Constitutional: NAD and appears stated age Neurological: Alert and oriented Psych: Appropriate affect and cooperative There were no vitals taken for this visit.    Comprehensive Musculoskeletal Exam:     Musculoskeletal Exam      Inspection Right Left  Skin  No atrophy or winging No atrophy or winging  Palpation      Tenderness None  Greater tuberosity  Range of Motion      Flexion (passive) 170 150 with pain  Flexion (active) 170 100  Abduction 170 100  ER at the side 70 55 with pain  Can reach behind back to T12 L5  Strength        Full Weakness in supraspinatus, negative belly press  Special Tests      Pseudoparalytic No No  Neurologic      Fires PIN, radial, median, ulnar, musculocutaneous, axillary, suprascapular, long thoracic, and spinal accessory innervated muscles. No abnormal sensibility  Vascular/Lymphatic      Radial Pulse 2+ 2+  Cervical Exam      Patient has symmetric cervical range of motion with negative Spurling's test.  Special Test: Positive biceps tendon tenderness with speeds test        Imaging:   Xray (3 views left shoulder): Normal   MRI left shoulder: There is a small full-thickness tear involving the anterior rotator cable of the rotator cuff supraspinatus as well as fluid around the biceps tendon     I personally reviewed and interpreted the radiographs.     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  At this time she is trialed working through physical therapy for multiple months.  She did feel like she is making progress but unfortunately she has recently felt like she return back to square 1.  We did discuss the MRI findings.  She does have a rather small tear involving the anterior portion of the supraspinatus although we did discuss that this is a relatively important area of the rotator cable with regards to her rotator cuff.  We did discuss the possibility of tear progression.  I did discuss that small tears are most frequently the most symptomatic.  At this time we discussed additional treatment options.  I do think that an injection can potentially help her with some pain relief and allow her to work  through physical therapy although she has already given physical therapy with very significant trial and believes she is back to square 1.  To that effect we did discuss arthroscopic intervention.  Specifically I do believe that she would benefit from a left shoulder arthroscopy with rotator cuff repair and biceps tenodesis given her physical exam findings.  After further discussion she is elected to defer an injection and would like to proceed with surgical intervention. Plan :     -Plan for left shoulder arthroscopy with biceps tenodesis     After a lengthy discussion of treatment options, including risks, benefits, alternatives, complications of surgical and nonsurgical conservative options, the patient elected surgical repair.    The patient  is aware of the material risks  and complications including, but not limited to injury to adjacent structures, neurovascular injury, infection, numbness, bleeding, implant failure, thermal burns, stiffness, persistent pain, failure to heal, disease transmission from allograft, need for further surgery, dislocation, anesthetic risks, blood clots, risks of death,and others. The probabilities of surgical success and failure discussed with patient given their particular co-morbidities.The time and nature of expected rehabilitation and recovery was discussed.The patient's questions were all answered preoperatively.  No barriers to understanding were noted. I explained the natural history of the disease process and Rx rationale.  I explained to the patient what I considered to be reasonable expectations given their personal situation.  The final treatment plan was arrived at through a shared patient decision making  process model.     I personally saw and evaluated the patient, and participated in the management and treatment plan.   Vanetta Mulders, MD Attending Physician, Orthopedic Surgery   This document was dictated using Dragon voice recognition software. A  reasonable attempt at proof reading has been made to minimize errors.

## 2022-04-30 NOTE — Transfer of Care (Signed)
Immediate Anesthesia Transfer of Care Note  Patient: Rebecca Bonilla  Procedure(s) Performed: LEFT SHOULDER ARTHROSCOPY WITH SUBACROMIAL DECOMPRESSION (Left: Shoulder) BICEPS TENODESIS LEFT SHOULDER (Shoulder)  Patient Location: PACU  Anesthesia Type:GA combined with regional for post-op pain  Level of Consciousness: drowsy  Airway & Oxygen Therapy: Patient Spontanous Breathing and Patient connected to face mask oxygen  Post-op Assessment: Report given to RN and Post -op Vital signs reviewed and stable  Post vital signs: Reviewed and stable  Last Vitals:  Vitals Value Taken Time  BP 131/77 04/30/22 0933  Temp    Pulse 96 04/30/22 0935  Resp 10 04/30/22 0935  SpO2 100 % 04/30/22 0935  Vitals shown include unvalidated device data.  Last Pain:  Vitals:   04/30/22 0639  TempSrc: Oral  PainSc: 3       Patients Stated Pain Goal: 3 (03/50/09 3818)  Complications: No notable events documented.

## 2022-04-30 NOTE — Anesthesia Procedure Notes (Signed)
Anesthesia Regional Block: Interscalene brachial plexus block   Pre-Anesthetic Checklist: , timeout performed,  Correct Patient, Correct Site, Correct Laterality,  Correct Procedure, Correct Position, site marked,  Risks and benefits discussed,  Surgical consent,  Pre-op evaluation,  At surgeon's request and post-op pain management  Laterality: Upper and Left  Prep: chloraprep       Needles:  Injection technique: Single-shot  Needle Type: Stimulator Needle - 40     Needle Length: 4cm  Needle Gauge: 22     Additional Needles:   Procedures:,,,, ultrasound used (permanent image in chart),,    Narrative:  Start time: 04/30/2022 6:59 AM End time: 04/30/2022 7:19 AM Injection made incrementally with aspirations every 5 mL.  Performed by: Personally  Anesthesiologist: Nolon Nations, MD  Additional Notes: BP cuff, SpO2 and EKG monitors applied. Sedation begun. Nerve location verified with ultrasound. Anesthetic injected incrementally, slowly, and after neg aspirations under direct u/s guidance. Good perineural spread. Tolerated well.

## 2022-04-30 NOTE — Anesthesia Postprocedure Evaluation (Signed)
Anesthesia Post Note  Patient: Rebecca Bonilla  Procedure(s) Performed: LEFT SHOULDER ARTHROSCOPY WITH SUBACROMIAL DECOMPRESSION (Left: Shoulder) BICEPS TENODESIS LEFT SHOULDER (Shoulder)     Patient location during evaluation: PACU Anesthesia Type: General Level of consciousness: sedated and patient cooperative Pain management: pain level controlled Vital Signs Assessment: post-procedure vital signs reviewed and stable Respiratory status: spontaneous breathing Cardiovascular status: stable Anesthetic complications: no   No notable events documented.  Last Vitals:  Vitals:   04/30/22 1045 04/30/22 1100  BP: (!) 146/84 (!) 149/85  Pulse: 88 99  Resp: 15 16  Temp:  (!) 36.4 C  SpO2: 96% 97%    Last Pain:  Vitals:   04/30/22 1100  TempSrc:   PainSc: 0-No pain                 Nolon Nations

## 2022-04-30 NOTE — Brief Op Note (Signed)
   Brief Op Note  Date of Surgery: 04/30/2022  Preoperative Diagnosis: LEFT SHOULDER ROTATOR CUFF TEAR  Postoperative Diagnosis: same  Procedure: Procedure(s): LEFT SHOULDER ARTHROSCOPY WITH SUBACROMIAL DECOMPRESSION BICEPS TENODESIS LEFT SHOULDER  Implants: Implant Name Type Inv. Item Serial No. Manufacturer Lot No. LRB No. Used Action  ANCHOR SUT QUATTRO KNTLS 4.5 - [id number] Anchor ANCHOR SUT QUATTRO KNTLS 4.5  [employer](ORTH,TRAU,BIO,SG) [id number] Left 1 Implanted    Surgeons: Surgeon(s): Vanetta Mulders, MD  Anesthesia: General    Estimated Blood Loss: See anesthesia record  Complications: None  Condition to PACU: Stable  Yevonne Pax, MD 04/30/2022 9:30 AM

## 2022-05-01 ENCOUNTER — Encounter (HOSPITAL_BASED_OUTPATIENT_CLINIC_OR_DEPARTMENT_OTHER): Payer: Self-pay | Admitting: Orthopaedic Surgery

## 2022-05-02 NOTE — Therapy (Addendum)
OUTPATIENT PHYSICAL THERAPY SHOULDER EVALUATION   Patient Name: Rebecca Bonilla MRN: [id number] DOB:[birth year], 46 y.o., female Today's Date: 05/03/2022   PT End of Session - 05/03/22 0823     Visit Number 1    Number of Visits 28    Date for PT Re-Evaluation 07/26/22    Authorization Type [employer]    PT Start Time 0803    PT Stop Time 0903    PT Time Calculation (min) 60 min    Activity Tolerance Patient tolerated treatment well    Behavior During Therapy [facility] for tasks assessed/performed             Past Medical History:  Diagnosis Date   Alcohol abuse    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   Hyponatremia 08/15/2020   Macrocytosis 10/29/2020   Portal hypertension (HCC) 08/14/2020   Rotator cuff disorder    Past Surgical History:  Procedure Laterality Date   ANKLE SURGERY     x 2   BICEPT TENODESIS  04/30/2022   Procedure: BICEPS TENODESIS LEFT SHOULDER;  Surgeon: Huel Cote, MD;  Location: [facility];  Service: Orthopedics;;   CHEST TUBE INSERTION Left 06/27/2021   Procedure: CHEST TUBE INSERTION;  Surgeon: Tomma Lightning, MD;  Location: [facility];  Service: Pulmonary;  Laterality: Left;   PARACENTESIS     SHOULDER ARTHROSCOPY WITH SUBACROMIAL DECOMPRESSION Left 04/30/2022   Procedure: LEFT SHOULDER ARTHROSCOPY WITH SUBACROMIAL DECOMPRESSION;  Surgeon: Huel Cote, MD;  Location: [facility];  Service: Orthopedics;  Laterality: Left;   THORACENTESIS N/A 06/18/2021   Procedure: Alanson Puls;  Surgeon: [name], MD;  Location: [facility];  Service: Pulmonary;  Laterality: N/A;   Patient Active Problem List   Diagnosis Date Noted   Nontraumatic incomplete tear of left rotator cuff    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   Recurrent left pleural effusion 06/26/2021   Flank pain 06/09/2021   Hematuria 06/09/2021   Ascites 04/02/2021   Secondary esophageal varices without bleeding (HCC) 12/19/2020    History of colon polyps 12/19/2020   SOB (shortness of breath) 10/30/2020   Pleural effusion 10/29/2020   Macrocytosis 10/29/2020   Hypokalemia 10/29/2020   Hypophosphatemia 10/29/2020   Hypomagnesemia 10/29/2020   Leukocytosis 10/12/2020   Thrombocytopenia (HCC) 10/12/2020   Anemia, chronic disease 09/20/2020   Alcoholism (HCC) 08/25/2020   Hyponatremia 08/15/2020   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   Portal hypertension (HCC) 08/14/2020   Alcoholic cirrhosis of liver with ascites (HCC) 08/14/2020   Gallbladder sludge 08/14/2020   Psoriasis 08/09/2020    REFERRING PROVIDER: Huel Cote, MD  REFERRING DIAG: [id number] (ICD-10-CM) - Rotator cuff tendinitis, left  s/p L shoulder arthroscopic biceps tenodesis, debridement, and subacromial decompression  THERAPY DIAG:  Left shoulder pain, unspecified chronicity  Stiffness of left shoulder, not elsewhere classified  Muscle weakness (generalized)  Rationale for Evaluation and Treatment Rehabilitation  ONSET DATE: End of January 2023 ; DOS 04/30/2022  SUBJECTIVE:  SUBJECTIVE STATEMENT: Pt was seen in PT from April 2023 to May 2023.  Pt was making good progress in PT though had a setback and returned to MD.  Pt had a MRI which showed a full thickness tear of the supraspinatus.  Pt underwent L shoulder arthroscopic biceps tenodesis, subacromial decompression, and extensive debridement (supraspinatus tendon, anterior, superior, and posterior labrum) on 04/30/2022.  Op note indicated RTC was found to be well intact.  Postoperative plan on Op note indicates: She will begin active range of motion at 2 weeks.  At this time she will plan for early and aggressive active range of motion without any rotator cuff restrictions.  She will follow the biceps  tendon protocol.  PT received message that RCR was not performed and pt to have aggressive ROM post op.    Pt is sling dependent and is limited with all self care activities and ADLs/IADLs.  She is unable to reach and perform any overhead activities.  Pt not working currently.  Pt unable to swim.  Pt's husband present during evaluation.    PERTINENT HISTORY: L shoulder arthroscopic biceps tenodesis, subacromial decompression, and extensive debridement on 04/30/2022.  Shoulder AROM to begin at 2 weeks. Cirrhosis of liver   PAIN:  Are you having pain? Yes NPRS:  Current:  3/10, Worst:  5/10, Best:  3/10 Location:  L anterior shoulder  PRECAUTIONS: Other: per surgical protocol.  No AROM for 2 weeks  WEIGHT BEARING RESTRICTIONS: Yes L UE  FALLS:  Has patient fallen in last 6 months? No    OCCUPATION: Pt is an attorney  PLOF: Independent.  Pt was able to perform all of her ADLs/IADLs, reaching activities, overhead activities without limitation or pain.  Pt was able to perform her normal work and lifting activities without limitation or pain.  Pt enjoys swimming  PATIENT GOALS:improve strength, to be able to swim, return to normal activities, to be able to pick up and lower pots and pans  OBJECTIVE:   DIAGNOSTIC FINDINGS:  Pt is post op and had a MRI prior to surgery.  PATIENT SURVEYS:  FOTO 25 with a goal of 16 at visit #23  COGNITION: Overall cognitive status: Within functional limits for tasks assessed  Pt is R hand dominant      POSTURE: PT removed post op dressings.  She has 4 portals intact with stitches with no drainage.  Pt has no signs of infection.  PT applied gauze and tegaderm over portals.  UPPER EXTREMITY ROM:   Active ROM Right eval Left eval  Shoulder flexion 155   Shoulder scaption 158   Shoulder abduction 130   Shoulder adduction    Shoulder internal rotation    Shoulder external rotation    Elbow flexion    Elbow extension    Wrist flexion     Wrist extension    Wrist ulnar deviation    Wrist radial deviation    Wrist pronation    Wrist supination    (Blank rows = not tested)  PT did not assess PROM, will check next visit.  UPPER EXTREMITY MMT: Strength not tested due to healing and surgical protocol    TODAY'S TREATMENT:  DATE: Pt performed wrist flexion/extension AROM in sling x 20 reps and towel gripping 2x10 reps in sling.   Pt to attempt pendulums though had pain with trying to allow arm to hand out of sling.  Pt did not perform pendulums.   Pt received a HEP handout and was educated in correct form and appropriate frequency.  Pt instructed to perform HEP in sling with arm supported.   See below for pt education.   PATIENT EDUCATION: Education details:  Educated pt in post op protocol and restrictions, sling compliance, dressing precautions, MD orders, dx, POC, and HEP.  Pt instructed to not use L UE with activities, but to remain in sling.   Person educated: Patient and Spouse Education method: Explanation, Demonstration, Tactile cues, Verbal cues, and Handouts Education comprehension: verbalized understanding, returned demonstration, verbal cues required, tactile cues required, and needs further education  HOME EXERCISE PROGRAM: Access Code: [email or web address] URL: [email or web address] Date: 05/03/2022 Prepared by: Aaron Edelman  Exercises - Wrist AROM Flexion Extension  - 3 x daily - 7 x weekly - 2-3 sets - 10 reps - Seated Gripping Towel  - 2-3 x daily - 7 x weekly - 2 sets - 10 reps  ASSESSMENT:  CLINICAL IMPRESSION: Patient is a 46 y.o. female 3 days s/p L shoulder arthroscopic biceps tenodesis, subacromial decompression, and extensive debridement presenting to the clinic with L shoulder pain, limited ROM in L shoulder, and muscle weakness in L UE.  Pt is sling dependent and is  limited with all self care activities and ADLs/IADLs.  She is unable to reach and perform any overhead activities.  Pt is not performing work activities and is  unable to swim.  Pt should benefit from skilled PT services per protocol to address impairments and to assist in restoring PLOF.   OBJECTIVE IMPAIRMENTS: decreased activity tolerance, decreased ROM, decreased strength, hypomobility, impaired flexibility, impaired UE functional use, and pain.   ACTIVITY LIMITATIONS: carrying, lifting, bathing, dressing, reach over head, and hygiene/grooming  PARTICIPATION LIMITATIONS: meal prep, cleaning, laundry, driving, shopping, community activity, and occupation  PERSONAL FACTORS: none  REHAB POTENTIAL: Good  CLINICAL DECISION MAKING: Stable/uncomplicated  EVALUATION COMPLEXITY: Low   GOALS:  SHORT TERM GOALS:   Pt will be independent and compliant with HEP for improved pain, ROM, strength, and function.  Baseline: Goal status: INITIAL Target date:  05/31/2022   2.  Pt will tolerate initiation of AAROM without adverse effects for improved shoulder mobility Baseline:  Goal status: INITIAL Target date:  05/16/2022   3.  Pt will demo L shoulder PROM to at least 110 deg in flexion and 35-40 deg in ER for improved ROM and stiffness. Baseline:  Goal status: INITIAL Target date:  05/24/2022     4.  Pt will demo AAROM to at least 120 deg and ER to 45 deg for improved shoulder mobility and function. Baseline:  Goal status: INITIAL Target date:  05/31/2022  5.  Pt will wean out of sling without adverse effects per MD instruction.  Baseline:  Goal status: INITIAL Target date:  05/31/2022  6.  Pt will be able to actively elevate L UE > 90 deg without significant pain. Baseline:  Goal status: INITIAL Target date:  06/07/2022  7.  Pt will be able to perform self care activities with no > than minimal difficulty.   Goal status: INITIAL  Target date:  06/28/2022  8.  Pt will be  able to perform her normal work activities without adverse effects.  Goal status: INITIAL  Target date:  06/14/2022    LONG TERM GOALS: Target date: 08/22/2022   Pt will demo L shoulder AROM to be [facility] t/o for performance of ADLs and IADLs. Baseline:  Goal status: INITIAL  2.  Pt will be able to perform ADLs and IADLs without significant pain and difficulty.  Baseline:  Goal status: INITIAL  3.  Pt will be able to perform her normal reaching and overhead activities without significant pain and limitation.   Baseline:  Goal status: INITIAL  4.  Pt will demo at least 4+/5 MMT strength t/o L shoulder for functional lifting/carrying and to assist with returning to recreational activities.  Baseline:  Goal status: INITIAL    PLAN:  PT FREQUENCY: 1-2x/wk x 3 weeks and 2x/wk afterward  PT DURATION: other: 12-16 weeks  PLANNED INTERVENTIONS: Therapeutic exercises, Therapeutic activity, Neuromuscular re-education, Patient/Family education, Self Care, Joint mobilization, Aquatic Therapy, Dry Needling, Electrical stimulation, Spinal mobilization, Cryotherapy, Moist heat, Taping, Ultrasound, Manual therapy, and Re-evaluation  PLAN FOR NEXT SESSION: Cont per biceps tenodesis protocol.  Op note indicates: She will begin active range of motion at 2 weeks.  At this time she will plan for early and aggressive active range of motion without any rotator cuff restrictions.  ice as needed.     Audie Clear III PT, DPT 05/03/22 5:13 PM

## 2022-05-03 ENCOUNTER — Encounter (HOSPITAL_BASED_OUTPATIENT_CLINIC_OR_DEPARTMENT_OTHER): Payer: Self-pay | Admitting: Physical Therapy

## 2022-05-03 ENCOUNTER — Other Ambulatory Visit: Payer: Self-pay

## 2022-05-03 ENCOUNTER — Ambulatory Visit (HOSPITAL_BASED_OUTPATIENT_CLINIC_OR_DEPARTMENT_OTHER): Payer: Managed Care, Other (non HMO) | Attending: Orthopaedic Surgery | Admitting: Physical Therapy

## 2022-05-03 DIAGNOSIS — M7582 Other shoulder lesions, left shoulder: Secondary | ICD-10-CM | POA: Diagnosis not present

## 2022-05-03 DIAGNOSIS — M6281 Muscle weakness (generalized): Secondary | ICD-10-CM

## 2022-05-03 DIAGNOSIS — M25612 Stiffness of left shoulder, not elsewhere classified: Secondary | ICD-10-CM

## 2022-05-03 DIAGNOSIS — M25512 Pain in left shoulder: Secondary | ICD-10-CM

## 2022-05-07 ENCOUNTER — Encounter (HOSPITAL_BASED_OUTPATIENT_CLINIC_OR_DEPARTMENT_OTHER): Payer: Self-pay | Admitting: Orthopaedic Surgery

## 2022-05-08 ENCOUNTER — Ambulatory Visit (HOSPITAL_BASED_OUTPATIENT_CLINIC_OR_DEPARTMENT_OTHER): Payer: Managed Care, Other (non HMO) | Attending: Orthopaedic Surgery | Admitting: Physical Therapy

## 2022-05-08 ENCOUNTER — Encounter (HOSPITAL_BASED_OUTPATIENT_CLINIC_OR_DEPARTMENT_OTHER): Payer: Self-pay | Admitting: Physical Therapy

## 2022-05-08 DIAGNOSIS — M6281 Muscle weakness (generalized): Secondary | ICD-10-CM | POA: Insufficient documentation

## 2022-05-08 DIAGNOSIS — M25512 Pain in left shoulder: Secondary | ICD-10-CM | POA: Insufficient documentation

## 2022-05-08 DIAGNOSIS — M25612 Stiffness of left shoulder, not elsewhere classified: Secondary | ICD-10-CM | POA: Diagnosis present

## 2022-05-08 NOTE — Therapy (Signed)
OUTPATIENT PHYSICAL THERAPY TREATMENT NOTE   Patient Name: Rebecca Bonilla MRN: [id number] DOB:01/30/1976, 46 y.o., female Today's Date: 05/08/2022   PT End of Session - 05/08/22 1106     Visit Number 2    Number of Visits 28    Date for PT Re-Evaluation 07/26/22    Authorization Type [employer]    PT Start Time 0803    PT Stop Time 0828    PT Time Calculation (min) 25 min    Activity Tolerance Patient tolerated treatment well    Behavior During Therapy [facility] for tasks assessed/performed              Past Medical History:  Diagnosis Date   Alcohol abuse    Cirrhosis of liver with ascites ([place]) 08/14/2020   Hyponatremia 08/15/2020   Macrocytosis 10/29/2020   Portal hypertension ([place]) 08/14/2020   Rotator cuff disorder    Past Surgical History:  Procedure Laterality Date   ANKLE SURGERY     x 2   BICEPT TENODESIS  04/30/2022   Procedure: BICEPS TENODESIS LEFT SHOULDER;  Surgeon: Vanetta Mulders, MD;  Location: [place];  Service: Orthopedics;;   CHEST TUBE INSERTION Left 06/27/2021   Procedure: CHEST TUBE INSERTION;  Surgeon: Laurin Coder, MD;  Location: [place] [facility];  Service: Pulmonary;  Laterality: Left;   PARACENTESIS     SHOULDER ARTHROSCOPY WITH SUBACROMIAL DECOMPRESSION Left 04/30/2022   Procedure: LEFT SHOULDER ARTHROSCOPY WITH SUBACROMIAL DECOMPRESSION;  Surgeon: Vanetta Mulders, MD;  Location: [place];  Service: Orthopedics;  Laterality: Left;   THORACENTESIS N/A 06/18/2021   Procedure: Mathews Robinsons;  Surgeon: Freddi Starr, MD;  Location: [facility] ENDOSCOPY;  Service: Pulmonary;  Laterality: N/A;   Patient Active Problem List   Diagnosis Date Noted   Nontraumatic incomplete tear of left rotator cuff    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   Recurrent left pleural effusion 06/26/2021   Flank pain 06/09/2021   Hematuria 06/09/2021   Ascites 04/02/2021   Secondary esophageal varices without bleeding ([place]) 12/19/2020    History of colon polyps 12/19/2020   SOB (shortness of breath) 10/30/2020   Pleural effusion 10/29/2020   Macrocytosis 10/29/2020   Hypokalemia 10/29/2020   Hypophosphatemia 10/29/2020   Hypomagnesemia 10/29/2020   Leukocytosis 10/12/2020   Thrombocytopenia ([place]) 10/12/2020   Anemia, chronic disease 09/20/2020   Alcoholism ([place]) 08/25/2020   Hyponatremia 08/15/2020   Cirrhosis of liver with ascites ([place]) 08/14/2020   Portal hypertension ([place]) 41/32/4401   Alcoholic cirrhosis of liver with ascites ([place]) 08/14/2020   Gallbladder sludge 08/14/2020   Psoriasis 08/09/2020    REFERRING PROVIDER: Vanetta Mulders, MD  REFERRING DIAG: [phone] (ICD-10-CM) - Rotator cuff tendinitis, left  s/p L shoulder arthroscopic biceps tenodesis, debridement, and subacromial decompression  THERAPY DIAG:  Left shoulder pain, unspecified chronicity  Stiffness of left shoulder, not elsewhere classified  Muscle weakness (generalized)  Rationale for Evaluation and Treatment Rehabilitation  ONSET DATE: End of January 2023 ; DOS 04/30/2022  SUBJECTIVE:  SUBJECTIVE STATEMENT: Pt is 1 weeks and 1 day s/p L shoulder arthroscopic biceps tenodesis, subacromial decompression, and extensive debridement.  Pt is sling dependent and is limited with all self care activities and ADLs/IADLs.   Pt is having difficulty sleeping.  Pt states her shoulder is not doing well.  Pt was holding a water bottle and pronated her wrist and felt a pop in her anterior shoulder.  This occurred yesterday and she had immediate pain.  Her pain has improved though still has the pain since the incident.  She was in the sling.  Pt denies any adverse effects after prior Rx.  Pt states she was doing great until the incident occurred.       PERTINENT HISTORY: L  shoulder arthroscopic biceps tenodesis, subacromial decompression, and extensive debridement on 04/30/2022.  Shoulder AROM to begin at 2 weeks. Cirrhosis of liver   PAIN:  Are you having pain? Yes NPRS:  Current:  6/10, Worst:  5/10, Best:  3/10 Location:  L anterior shoulder  PRECAUTIONS: Other: per surgical protocol.  No AROM for 2 weeks  WEIGHT BEARING RESTRICTIONS: Yes L UE  FALLS:  Has patient fallen in last 6 months? No    OCCUPATION: Pt is an attorney  PLOF: Independent.  Pt was able to perform all of her ADLs/IADLs, reaching activities, overhead activities without limitation or pain.  Pt was able to perform her normal work and lifting activities without limitation or pain.  Pt enjoys swimming  PATIENT GOALS:improve strength, to be able to swim, return to normal activities, to be able to pick up and lower pots and pans  OBJECTIVE:   DIAGNOSTIC FINDINGS:  Pt is post op and had a MRI prior to surgery.     TODAY'S TREATMENT:   Therapeutic Exercise: Reviewed pt presentation, pain level, and response to prior Rx.  PT removed tegaderm and gauze dressings and applied new gauze and tegaderm over portals.  Portals are intact with stitches.  Portals are dry with no drainage.  She had no signs of infection.   PT observed L shoulder and assessed palpation.  She was very tender to palpate anterior L shoulder and also had some tenderness in L biceps.  Pt had no bruising and no obvious defect.  She may have some mild swelling though no significant swelling.  PT educated pt concerning findings.  PT messaged MD and ATC and explained to pt MD response/instructions.  PT reviewed HEP and instructed pt to cont with HEP.  See below for pt education.   PATIENT EDUCATION: Education details:  Educated pt in post op protocol and restrictions, sling compliance, dressing precautions, POC, HEP, and MD instructions.  Instructed pt to contact MD if pain does not improve, sx's worsen, or she  notices bruising/swelling.   Person educated: Patient Education method: Explanation, Demonstration, Tactile cues, Verbal cues, and Handouts Education comprehension: verbalized understanding, returned demonstration, verbal cues required, tactile cues required, and needs further education  HOME EXERCISE PROGRAM: Access Code: [email or web address] URL: [email or web address] Date: 05/03/2022 Prepared by: Aaron Edelman  Exercises - Wrist AROM Flexion Extension  - 3 x daily - 7 x weekly - 2-3 sets - 10 reps - Seated Gripping Towel  - 2-3 x daily - 7 x weekly - 2 sets - 10 reps  ASSESSMENT:  CLINICAL IMPRESSION: Patient presents to Rx c/o'ing of increased pain in shoulder and biceps since yesterday when she was holding a water bottle and pronated wrist.  Pt felt a pop and had immediate pain.  PT assessed and observed L shoulder.  She had no obvious defect and no bruising.  She was tender to palpate in anterior shoulder and biceps.  PT sent message to MD and ATC concerning pt's subjective report, pain, and objective findings.  PT was informed pt doesn't have to come in at this time, and has a follow up with MD on Monday.  PT explained MD response to pt and educated pt in what to look for that would indicate for her to call MD and see MD.  PT didn't perform any PROM or assess shoulder ROM due to recent incidence of popping and increased pain.  PT changed her dressings and her portals looked good having no signs of infection.      OBJECTIVE IMPAIRMENTS: decreased activity tolerance, decreased ROM, decreased strength, hypomobility, impaired flexibility, impaired UE functional use, and pain.   ACTIVITY LIMITATIONS: carrying, lifting, bathing, dressing, reach over head, and hygiene/grooming  PARTICIPATION LIMITATIONS: meal prep, cleaning, laundry, driving, shopping, community activity, and occupation  PERSONAL FACTORS: none  REHAB POTENTIAL: Good  CLINICAL DECISION MAKING:  Stable/uncomplicated  EVALUATION COMPLEXITY: Low   GOALS:  SHORT TERM GOALS:   Pt will be independent and compliant with HEP for improved pain, ROM, strength, and function.  Baseline: Goal status: INITIAL Target date:  05/31/2022   2.  Pt will tolerate initiation of AAROM without adverse effects for improved shoulder mobility Baseline:  Goal status: INITIAL Target date:  05/16/2022   3.  Pt will demo L shoulder PROM to at least 110 deg in flexion and 35-40 deg in ER for improved ROM and stiffness. Baseline:  Goal status: INITIAL Target date:  05/24/2022     4.  Pt will demo AAROM to at least 120 deg and ER to 45 deg for improved shoulder mobility and function. Baseline:  Goal status: INITIAL Target date:  05/31/2022  5.  Pt will wean out of sling without adverse effects per MD instruction.  Baseline:  Goal status: INITIAL Target date:  05/31/2022  6.  Pt will be able to actively elevate L UE > 90 deg without significant pain. Baseline:  Goal status: INITIAL Target date:  06/07/2022  7.  Pt will be able to perform self care activities with no > than minimal difficulty.   Goal status: INITIAL  Target date:  06/28/2022  8.  Pt will be able to perform her normal work activities without adverse effects.    Goal status: INITIAL  Target date:  06/14/2022    LONG TERM GOALS: Target date: 08/22/2022   Pt will demo L shoulder AROM to be [facility] t/o for performance of ADLs and IADLs. Baseline:  Goal status: INITIAL  2.  Pt will be able to perform ADLs and IADLs without significant pain and difficulty.  Baseline:  Goal status: INITIAL  3.  Pt will be able to perform her normal reaching and overhead activities without significant pain and limitation.   Baseline:  Goal status: INITIAL  4.  Pt will demo at least 4+/5 MMT strength t/o L shoulder for functional lifting/carrying and to assist with returning to recreational activities.  Baseline:  Goal status:  INITIAL    PLAN:  PT FREQUENCY: 1-2x/wk x 3 weeks and 2x/wk afterward  PT DURATION: other: 12-16 weeks  PLANNED INTERVENTIONS: Therapeutic exercises, Therapeutic activity, Neuromuscular re-education, Patient/Family education, Self Care, Joint mobilization, Aquatic Therapy, Dry Needling, Electrical stimulation, Spinal mobilization, Cryotherapy, Moist heat, Taping, Ultrasound, Manual therapy, and Re-evaluation  PLAN FOR NEXT SESSION:  Pt has a follow up with MD on Monday.  Pt will contact MD if her pain does not improve, sx's worsen, or she has bruising/swelling.  Pt is in agreement and understands POC.    Cont per biceps tenodesis protocol.  Op note indicates: She will begin active range of motion at 2 weeks.  At this time she will plan for early and aggressive active range of motion without any rotator cuff restrictions.  ice as needed.     Audie Clear III PT, DPT 05/08/22 7:58 PM

## 2022-05-10 ENCOUNTER — Encounter (HOSPITAL_BASED_OUTPATIENT_CLINIC_OR_DEPARTMENT_OTHER): Payer: Self-pay | Admitting: Physical Therapy

## 2022-05-10 ENCOUNTER — Ambulatory Visit (HOSPITAL_BASED_OUTPATIENT_CLINIC_OR_DEPARTMENT_OTHER): Payer: Managed Care, Other (non HMO) | Admitting: Physical Therapy

## 2022-05-10 DIAGNOSIS — M25612 Stiffness of left shoulder, not elsewhere classified: Secondary | ICD-10-CM

## 2022-05-10 DIAGNOSIS — M25512 Pain in left shoulder: Secondary | ICD-10-CM

## 2022-05-10 DIAGNOSIS — M6281 Muscle weakness (generalized): Secondary | ICD-10-CM

## 2022-05-10 NOTE — Therapy (Unsigned)
OUTPATIENT PHYSICAL THERAPY TREATMENT NOTE   Patient Name: Rebecca Bonilla MRN: [id number] DOB:01/30/1976, 46 y.o., female Today's Date: 05/08/2022   PT End of Session - 05/08/22 1106     Visit Number 2    Number of Visits 28    Date for PT Re-Evaluation 07/26/22    Authorization Type [employer]    PT Start Time 0803    PT Stop Time 0828    PT Time Calculation (min) 25 min    Activity Tolerance Patient tolerated treatment well    Behavior During Therapy [facility] for tasks assessed/performed              Past Medical History:  Diagnosis Date   Alcohol abuse    Cirrhosis of liver with ascites ([place]) 08/14/2020   Hyponatremia 08/15/2020   Macrocytosis 10/29/2020   Portal hypertension ([place]) 08/14/2020   Rotator cuff disorder    Past Surgical History:  Procedure Laterality Date   ANKLE SURGERY     x 2   BICEPT TENODESIS  04/30/2022   Procedure: BICEPS TENODESIS LEFT SHOULDER;  Surgeon: Vanetta Mulders, MD;  Location: [place];  Service: Orthopedics;;   CHEST TUBE INSERTION Left 06/27/2021   Procedure: CHEST TUBE INSERTION;  Surgeon: Laurin Coder, MD;  Location: [place] [facility];  Service: Pulmonary;  Laterality: Left;   PARACENTESIS     SHOULDER ARTHROSCOPY WITH SUBACROMIAL DECOMPRESSION Left 04/30/2022   Procedure: LEFT SHOULDER ARTHROSCOPY WITH SUBACROMIAL DECOMPRESSION;  Surgeon: Vanetta Mulders, MD;  Location: [place];  Service: Orthopedics;  Laterality: Left;   THORACENTESIS N/A 06/18/2021   Procedure: Mathews Robinsons;  Surgeon: Freddi Starr, MD;  Location: [facility];  Service: Pulmonary;  Laterality: N/A;   Patient Active Problem List   Diagnosis Date Noted   Nontraumatic incomplete tear of left rotator cuff    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   Recurrent left pleural effusion 06/26/2021   Flank pain 06/09/2021   Hematuria 06/09/2021   Ascites 04/02/2021   Secondary esophageal varices without bleeding ([place]) 12/19/2020    History of colon polyps 12/19/2020   SOB (shortness of breath) 10/30/2020   Pleural effusion 10/29/2020   Macrocytosis 10/29/2020   Hypokalemia 10/29/2020   Hypophosphatemia 10/29/2020   Hypomagnesemia 10/29/2020   Leukocytosis 10/12/2020   Thrombocytopenia ([place]) 10/12/2020   Anemia, chronic disease 09/20/2020   Alcoholism ([place]) 08/25/2020   Hyponatremia 08/15/2020   Cirrhosis of liver with ascites ([place]) 08/14/2020   Portal hypertension ([place]) 41/32/4401   Alcoholic cirrhosis of liver with ascites ([place]) 08/14/2020   Gallbladder sludge 08/14/2020   Psoriasis 08/09/2020    REFERRING PROVIDER: Vanetta Mulders, MD  REFERRING DIAG: [phone] (ICD-10-CM) - Rotator cuff tendinitis, left  s/p L shoulder arthroscopic biceps tenodesis, debridement, and subacromial decompression  THERAPY DIAG:  Left shoulder pain, unspecified chronicity  Stiffness of left shoulder, not elsewhere classified  Muscle weakness (generalized)  Rationale for Evaluation and Treatment Rehabilitation  ONSET DATE: End of January 2023 ; DOS 04/30/2022  SUBJECTIVE:  SUBJECTIVE STATEMENT: Pt is 1 weeks and 1 day s/p L shoulder arthroscopic biceps tenodesis, subacromial decompression, and extensive debridement.  Pt is sling dependent and is limited with all self care activities and ADLs/IADLs.   Pt is having difficulty sleeping.  Pt states her shoulder is not doing well.  Pt was holding a water bottle and pronated her wrist and felt a pop in her anterior shoulder.  This occurred yesterday and she had immediate pain.  Her pain has improved though still has the pain since the incident.  She was in the sling.  Pt denies any adverse effects after prior Rx.  Pt states she was doing great until the incident occurred.       PERTINENT HISTORY: L  shoulder arthroscopic biceps tenodesis, subacromial decompression, and extensive debridement on 04/30/2022.  Shoulder AROM to begin at 2 weeks. Cirrhosis of liver   PAIN:  Are you having pain? Yes NPRS:  Current:  6/10, Worst:  5/10, Best:  3/10 Location:  L anterior shoulder  PRECAUTIONS: Other: per surgical protocol.  No AROM for 2 weeks  WEIGHT BEARING RESTRICTIONS: Yes L UE  FALLS:  Has patient fallen in last 6 months? No    OCCUPATION: Pt is an attorney  PLOF: Independent.  Pt was able to perform all of her ADLs/IADLs, reaching activities, overhead activities without limitation or pain.  Pt was able to perform her normal work and lifting activities without limitation or pain.  Pt enjoys swimming  PATIENT GOALS:improve strength, to be able to swim, return to normal activities, to be able to pick up and lower pots and pans  OBJECTIVE:   DIAGNOSTIC FINDINGS:  Pt is post op and had a MRI prior to surgery.     TODAY'S TREATMENT:   Therapeutic Exercise: Reviewed pt presentation, pain level, and response to prior Rx.  PT removed tegaderm and gauze dressings and applied new gauze and tegaderm over portals.  Portals are intact with stitches.  Portals are dry with no drainage.  She had no signs of infection.   PT observed L shoulder and assessed palpation.  She was very tender to palpate anterior L shoulder and also had some tenderness in L biceps.  Pt had no bruising and no obvious defect.  She may have some mild swelling though no significant swelling.  PT educated pt concerning findings.  PT messaged MD and ATC and explained to pt MD response/instructions.  PT reviewed HEP and instructed pt to cont with HEP.  See below for pt education.   PATIENT EDUCATION: Education details:  Educated pt in post op protocol and restrictions, sling compliance, dressing precautions, POC, HEP, and MD instructions.  Instructed pt to contact MD if pain does not improve, sx's worsen, or she  notices bruising/swelling.   Person educated: Patient Education method: Explanation, Demonstration, Tactile cues, Verbal cues, and Handouts Education comprehension: verbalized understanding, returned demonstration, verbal cues required, tactile cues required, and needs further education  HOME EXERCISE PROGRAM: Access Code: [email or web address] URL: [email or web address] Date: 05/03/2022 Prepared by: Aaron Edelman  Exercises - Wrist AROM Flexion Extension  - 3 x daily - 7 x weekly - 2-3 sets - 10 reps - Seated Gripping Towel  - 2-3 x daily - 7 x weekly - 2 sets - 10 reps  ASSESSMENT:  CLINICAL IMPRESSION: T       OBJECTIVE IMPAIRMENTS: decreased activity tolerance, decreased ROM, decreased strength, hypomobility, impaired flexibility, impaired UE functional use, and pain.   ACTIVITY LIMITATIONS: carrying, lifting, bathing, dressing,  reach over head, and hygiene/grooming  PARTICIPATION LIMITATIONS: meal prep, cleaning, laundry, driving, shopping, community activity, and occupation  PERSONAL FACTORS: none  REHAB POTENTIAL: Good  CLINICAL DECISION MAKING: Stable/uncomplicated  EVALUATION COMPLEXITY: Low   GOALS:  SHORT TERM GOALS:   Pt will be independent and compliant with HEP for improved pain, ROM, strength, and function.  Baseline: Goal status: INITIAL Target date:  05/31/2022   2.  Pt will tolerate initiation of AAROM without adverse effects for improved shoulder mobility Baseline:  Goal status: INITIAL Target date:  05/16/2022   3.  Pt will demo L shoulder PROM to at least 110 deg in flexion and 35-40 deg in ER for improved ROM and stiffness. Baseline:  Goal status: INITIAL Target date:  05/24/2022     4.  Pt will demo AAROM to at least 120 deg and ER to 45 deg for improved shoulder mobility and function. Baseline:  Goal status: INITIAL Target date:  05/31/2022  5.  Pt will wean out of sling without adverse effects per MD instruction.  Baseline:   Goal status: INITIAL Target date:  05/31/2022  6.  Pt will be able to actively elevate L UE > 90 deg without significant pain. Baseline:  Goal status: INITIAL Target date:  06/07/2022  7.  Pt will be able to perform self care activities with no > than minimal difficulty.   Goal status: INITIAL  Target date:  06/28/2022  8.  Pt will be able to perform her normal work activities without adverse effects.    Goal status: INITIAL  Target date:  06/14/2022    LONG TERM GOALS: Target date: 08/22/2022   Pt will demo L shoulder AROM to be [facility] t/o for performance of ADLs and IADLs. Baseline:  Goal status: INITIAL  2.  Pt will be able to perform ADLs and IADLs without significant pain and difficulty.  Baseline:  Goal status: INITIAL  3.  Pt will be able to perform her normal reaching and overhead activities without significant pain and limitation.   Baseline:  Goal status: INITIAL  4.  Pt will demo at least 4+/5 MMT strength t/o L shoulder for functional lifting/carrying and to assist with returning to recreational activities.  Baseline:  Goal status: INITIAL    PLAN:  PT FREQUENCY: 1-2x/wk x 3 weeks and 2x/wk afterward  PT DURATION: other: 12-16 weeks  PLANNED INTERVENTIONS: Therapeutic exercises, Therapeutic activity, Neuromuscular re-education, Patient/Family education, Self Care, Joint mobilization, Aquatic Therapy, Dry Needling, Electrical stimulation, Spinal mobilization, Cryotherapy, Moist heat, Taping, Ultrasound, Manual therapy, and Re-evaluation  PLAN FOR NEXT SESSION:  Pt has a follow up with MD on Monday.  Pt will contact MD if her pain does not improve, sx's worsen, or she has bruising/swelling.  Pt is in agreement and understands POC.    Cont per biceps tenodesis protocol.  Op note indicates: She will begin active range of motion at 2 weeks.  At this time she will plan for early and aggressive active range of motion without any rotator cuff restrictions.  ice as  needed.    Lorayne Bender PT DPT  05/08/22 7:58 PM

## 2022-05-11 ENCOUNTER — Encounter (HOSPITAL_BASED_OUTPATIENT_CLINIC_OR_DEPARTMENT_OTHER): Payer: Self-pay | Admitting: Physical Therapy

## 2022-05-13 ENCOUNTER — Ambulatory Visit (INDEPENDENT_AMBULATORY_CARE_PROVIDER_SITE_OTHER): Payer: Managed Care, Other (non HMO) | Admitting: Orthopaedic Surgery

## 2022-05-13 DIAGNOSIS — M7582 Other shoulder lesions, left shoulder: Secondary | ICD-10-CM

## 2022-05-13 NOTE — Progress Notes (Signed)
Post Operative Evaluation    Procedure/Date of Surgery: Left shoulder arthroscopy with biceps tenodesis and subacromial decompression 04/30/22  Interval History:   Presents today 2 weeks status post the above procedure.  Overall she is doing very well.  Pain is continuing to improve.  She did have a pop postoperatively but denied any bruising or biceps deformity.  She has been compliant with aspirin usage.  She has been working with physical therapy.   PMH/PSH/Family History/Social History/Meds/Allergies:    Past Medical History:  Diagnosis Date   Alcohol abuse    Cirrhosis of liver with ascites ([place]) 08/14/2020   Hyponatremia 08/15/2020   Macrocytosis 10/29/2020   Portal hypertension ([place]) 08/14/2020   Rotator cuff disorder    Past Surgical History:  Procedure Laterality Date   ANKLE SURGERY     x 2   BICEPT TENODESIS  04/30/2022   Procedure: BICEPS TENODESIS LEFT SHOULDER;  Surgeon: Vanetta Mulders, MD;  Location: [place];  Service: Orthopedics;;   CHEST TUBE INSERTION Left 06/27/2021   Procedure: CHEST TUBE INSERTION;  Surgeon: Laurin Coder, MD;  Location: [facility];  Service: Pulmonary;  Laterality: Left;   PARACENTESIS     SHOULDER ARTHROSCOPY WITH SUBACROMIAL DECOMPRESSION Left 04/30/2022   Procedure: LEFT SHOULDER ARTHROSCOPY WITH SUBACROMIAL DECOMPRESSION;  Surgeon: Vanetta Mulders, MD;  Location: [place];  Service: Orthopedics;  Laterality: Left;   THORACENTESIS N/A 06/18/2021   Procedure: Mathews Robinsons;  Surgeon: Freddi Starr, MD;  Location: [facility];  Service: Pulmonary;  Laterality: N/A;   Social History   Socioeconomic History   Marital status: Married    Spouse name: Not on file   Number of children: Not on file   Years of education: Not on file   Highest education level: Not on file  Occupational History   Not on file  Tobacco Use   Smoking status: Former     Packs/day: 0.50    Years: 10.00    Total pack years: 5.00    Types: Cigarettes   Smokeless tobacco: Never  Vaping Use   Vaping Use: Former  Substance and Sexual Activity   Alcohol use: Not Currently   Drug use: Never   Sexual activity: Yes    Birth control/protection: Post-menopausal  Other Topics Concern   Not on file  Social History Narrative   Not on file   Social Determinants of Health   Financial Resource Strain: Not on file  Food Insecurity: Not on file  Transportation Needs: Not on file  Physical Activity: Not on file  Stress: Not on file  Social Connections: Not on file   Family History  Problem Relation Age of Onset   Pancreatic cancer Paternal Grandfather    Skin cancer Father    Huntington's disease Mother    Allergies  Allergen Reactions   Clemastine Rash and Other (See Comments)    Tavist     Hydrocodone Nausea And Vomiting   Ibuprofen Other (See Comments)    Intestinal reactions    Other Rash and Other (See Comments)    Albertson's Dayhist-D/generic of Tavist- (reaction to antihistamine that is no longer on the market)   Oxycodone Nausea Only and Other (See Comments)    Nausea to opioids     Current Outpatient Medications  Medication Sig Dispense Refill  aspirin EC 325 MG tablet Take 1 tablet (325 mg total) by mouth daily. 30 tablet 0   clindamycin (CLINDAGEL) 1 % gel Apply topically 2 (two) times daily. 30 g 11   clobetasol ointment (TEMOVATE) 0.05 % Apply 1 application topically 2 (two) times daily as needed (psoriasis- bilateral legs).     furosemide (LASIX) 20 MG tablet Take 20 mg by mouth every morning.     lidocaine (XYLOCAINE) 2 % solution Use as directed 5 mLs in the mouth or throat every 3 (three) hours as needed (tooth pain). (Patient not taking: Reported on 05/03/2022)     LORazepam (ATIVAN) 0.5 MG tablet Take by mouth. (Patient not taking: Reported on 05/03/2022)     Potassium Chloride ER 20 MEQ TBCR Take 20 mEq by mouth every  morning.     spironolactone (ALDACTONE) 100 MG tablet Take 1 tablet (100 mg total) by mouth daily. 30 tablet 3   triamcinolone cream (KENALOG) 0.1 % Apply 1 application topically daily as needed (psoriasis- bilateral legs).     TYLENOL CHILDRENS CHEWABLES 160 MG CHEW Chew 80 mg by mouth daily as needed (for headaches).     No current facility-administered medications for this visit.   No results found.  Review of Systems:   A ROS was performed including pertinent positives and negatives as documented in the HPI.   Musculoskeletal Exam:    There were no vitals taken for this visit.  Left shoulder incisions are well-appearing without erythema or drainage.  Passive forward elevation is to 90 degrees.  External rotation at the side is to 30 degrees.  No internal rotation was tested today.  2+ radial pulse with distal neurosensory exam intact  Imaging:      I personally reviewed and interpreted the radiographs.   Assessment:   46 year old female who is 2 weeks status post left shoulder subacromial decompression and biceps tenodesis overall doing very well.  I will plan to see her back in 4 weeks for reassessment.  At this time she may begin active assisted range of motion in addition to passive and passive assisted range of motion.  I discussed that given the fact that I did not perform a rotator cuff repair rather debridement she may be more aggressive on active range of motion overhead.  At this time she will essentially follow the biceps tenodesis protocol  Plan :    -Return clinic in 4 weeks for reassessment      I personally saw and evaluated the patient, and participated in the management and treatment plan.  Huel Cote, MD Attending Physician, Orthopedic Surgery  This document was dictated using Dragon voice recognition software. A reasonable attempt at proof reading has been made to minimize errors.

## 2022-05-17 ENCOUNTER — Encounter (HOSPITAL_BASED_OUTPATIENT_CLINIC_OR_DEPARTMENT_OTHER): Payer: Self-pay | Admitting: Physical Therapy

## 2022-05-17 ENCOUNTER — Ambulatory Visit (HOSPITAL_BASED_OUTPATIENT_CLINIC_OR_DEPARTMENT_OTHER): Payer: Managed Care, Other (non HMO) | Admitting: Physical Therapy

## 2022-05-17 DIAGNOSIS — M6281 Muscle weakness (generalized): Secondary | ICD-10-CM

## 2022-05-17 DIAGNOSIS — M25612 Stiffness of left shoulder, not elsewhere classified: Secondary | ICD-10-CM

## 2022-05-17 DIAGNOSIS — M25512 Pain in left shoulder: Secondary | ICD-10-CM

## 2022-05-17 NOTE — Therapy (Signed)
OUTPATIENT PHYSICAL THERAPY TREATMENT NOTE   Patient Name: Rebecca Bonilla MRN: [id number] DOB:[birth year], 46 y.o., female Today's Date: 05/17/2022   PT End of Session - 05/17/22 0944     Visit Number 4    Number of Visits 28    Date for PT Re-Evaluation 07/26/22    Authorization Type [employer]    PT Start Time 0900    PT Stop Time 0934    PT Time Calculation (min) 34 min    Activity Tolerance Patient tolerated treatment well    Behavior During Therapy [facility] for tasks assessed/performed              Past Medical History:  Diagnosis Date   Alcohol abuse    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   Hyponatremia 08/15/2020   Macrocytosis 10/29/2020   Portal hypertension (HCC) 08/14/2020   Rotator cuff disorder    Past Surgical History:  Procedure Laterality Date   ANKLE SURGERY     x 2   BICEPT TENODESIS  04/30/2022   Procedure: BICEPS TENODESIS LEFT SHOULDER;  Surgeon: Huel Cote, MD;  Location: [facility];  Service: Orthopedics;;   CHEST TUBE INSERTION Left 06/27/2021   Procedure: CHEST TUBE INSERTION;  Surgeon: Tomma Lightning, MD;  Location: [facility];  Service: Pulmonary;  Laterality: Left;   PARACENTESIS     SHOULDER ARTHROSCOPY WITH SUBACROMIAL DECOMPRESSION Left 04/30/2022   Procedure: LEFT SHOULDER ARTHROSCOPY WITH SUBACROMIAL DECOMPRESSION;  Surgeon: Huel Cote, MD;  Location: [facility];  Service: Orthopedics;  Laterality: Left;   THORACENTESIS N/A 06/18/2021   Procedure: Alanson Puls;  Surgeon: [name], MD;  Location: [facility];  Service: Pulmonary;  Laterality: N/A;   Patient Active Problem List   Diagnosis Date Noted   Nontraumatic incomplete tear of left rotator cuff    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   Recurrent left pleural effusion 06/26/2021   Flank pain 06/09/2021   Hematuria 06/09/2021   Ascites 04/02/2021   Secondary esophageal varices without bleeding (HCC) 12/19/2020    History of colon polyps 12/19/2020   SOB (shortness of breath) 10/30/2020   Pleural effusion 10/29/2020   Macrocytosis 10/29/2020   Hypokalemia 10/29/2020   Hypophosphatemia 10/29/2020   Hypomagnesemia 10/29/2020   Leukocytosis 10/12/2020   Thrombocytopenia (HCC) 10/12/2020   Anemia, chronic disease 09/20/2020   Alcoholism (HCC) 08/25/2020   Hyponatremia 08/15/2020   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   Portal hypertension (HCC) 08/14/2020   Alcoholic cirrhosis of liver with ascites (HCC) 08/14/2020   Gallbladder sludge 08/14/2020   Psoriasis 08/09/2020    REFERRING PROVIDER: Huel Cote, MD  REFERRING DIAG: [phone] (ICD-10-CM) - Rotator cuff tendinitis, left  s/p L shoulder arthroscopic biceps tenodesis, debridement, and subacromial decompression  THERAPY DIAG:  Left shoulder pain, unspecified chronicity  Stiffness of left shoulder, not elsewhere classified  Muscle weakness (generalized)  Rationale for Evaluation and Treatment Rehabilitation  ONSET DATE: End of January 2023 ; DOS 04/30/2022  SUBJECTIVE:  SUBJECTIVE STATEMENT: Pt is 2 weeks and 3 days s/p L shoulder arthroscopic biceps tenodesis, subacromial decompression, and extensive debridement.  Pt is sling dependent and is limited with all self care activities and ADLs/IADLs.   Pt states her shoulder is doing better since prior incident.  Pt still gets some popping but not like the painful pop earlier.  Pt saw MD on Monday who removed stitches.  Pt states MD was pleased with shoulder.  MD note indicated pt can begin active assisted range of motion in addition to passive and passive assisted range of motion.  Pt denies any adverse effects after prior Rx.  Pt used ice after prior Rx.  Pt reports compliance with HEP.      PERTINENT HISTORY: L shoulder arthroscopic biceps tenodesis, subacromial decompression, and extensive debridement on 04/30/2022.  Shoulder AROM to begin at 2 weeks. Cirrhosis of liver   PAIN:  Are you having pain? Yes NPRS:  Current:  3/10, Worst:  5/10, Best:  3/10 Location:  L anterior shoulder  PRECAUTIONS: Other: per surgical protocol.  No AROM for 2 weeks  WEIGHT BEARING RESTRICTIONS: Yes L UE  FALLS:  Has patient fallen in last 6 months? No    OCCUPATION: Pt is an attorney  PLOF: Independent.  Pt was able to perform all of her ADLs/IADLs, reaching activities, overhead activities without limitation or pain.  Pt was able to perform her normal work and lifting activities without limitation or pain.  Pt enjoys swimming  PATIENT GOALS:improve strength, to be able to swim, return to normal activities, to be able to pick up and lower pots and pans  OBJECTIVE:   DIAGNOSTIC FINDINGS:  Pt is post op and had a MRI prior to surgery.     TODAY'S TREATMENT:  OBSERVATION: Portals intact with steri strips over them.   -Reviewed pt presentation, HEP compliance, response to prior Rx, and pain level.   -Pt received L shoulder PROM in flexion, scaption, ER, and IR per protocol ranges and pt tolerance   -Pt performed : Scap retractions x20  supine wand flexion 2x10 reps w/n protocol and pt tolerance.  Pt instructed to not perform into a painful or tight range and she should not feel tension with AAROM.   Pt received a HEP handout and was educated in correct form and appropriate frequency.  Pt instructed she should not have pain with HEP and should not perform into tension or pain.   PATIENT EDUCATION: Education details:  Educated pt in post op protocol and restrictions, sling compliance, POC, HEP, and MD instructions.  Person educated: Patient Education method: Explanation, Demonstration, Tactile cues, Verbal cues, and Handouts Education comprehension: verbalized understanding,  returned demonstration, verbal cues required, tactile cues required, and needs further education  HOME EXERCISE PROGRAM: Access Code: [email or web address] URL: [email or web address] Date: 05/03/2022 Prepared by: Aaron Edelman  Updated HEP: Supine wand flexion AAROM 2x/day, 1-2 sets of 10 reps  ASSESSMENT:  CLINICAL IMPRESSION: Pt's shoulder is feeling better.  She saw MD who released her to perform AAROM.  PT performed PROM w/n pt tolerance and protocol.  Pt tolerated PROM well without c/o's and is progressing well with ROM.  She performed supine wand flexion well without increased pain.  PT instructed pt in appropriate range and pt demonstrates good understanding and appropriate range.  She received a handout.  Pt responded well to Rx having no increased pain and no c/o's after Rx.  She should continue to benefit from cont skilled PT services per protocol and  MD instruction to address ongoing goals and to improve function.         OBJECTIVE IMPAIRMENTS: decreased activity tolerance, decreased ROM, decreased strength, hypomobility, impaired flexibility, impaired UE functional use, and pain.   ACTIVITY LIMITATIONS: carrying, lifting, bathing, dressing, reach over head, and hygiene/grooming  PARTICIPATION LIMITATIONS: meal prep, cleaning, laundry, driving, shopping, community activity, and occupation  PERSONAL FACTORS: none  REHAB POTENTIAL: Good  CLINICAL DECISION MAKING: Stable/uncomplicated  EVALUATION COMPLEXITY: Low   GOALS:  SHORT TERM GOALS:   Pt will be independent and compliant with HEP for improved pain, ROM, strength, and function.  Baseline: Goal status: INITIAL Target date:  05/31/2022   2.  Pt will tolerate initiation of AAROM without adverse effects for improved shoulder mobility Baseline:  Goal status: INITIAL Target date:  05/16/2022   3.  Pt will demo L shoulder PROM to at least 110 deg in flexion and 35-40 deg in ER for improved ROM and  stiffness. Baseline:  Goal status: INITIAL Target date:  05/24/2022     4.  Pt will demo AAROM to at least 120 deg and ER to 45 deg for improved shoulder mobility and function. Baseline:  Goal status: INITIAL Target date:  05/31/2022  5.  Pt will wean out of sling without adverse effects per MD instruction.  Baseline:  Goal status: INITIAL Target date:  05/31/2022  6.  Pt will be able to actively elevate L UE > 90 deg without significant pain. Baseline:  Goal status: INITIAL Target date:  06/07/2022  7.  Pt will be able to perform self care activities with no > than minimal difficulty.   Goal status: INITIAL  Target date:  06/28/2022  8.  Pt will be able to perform her normal work activities without adverse effects.    Goal status: INITIAL  Target date:  06/14/2022    LONG TERM GOALS: Target date: 08/22/2022   Pt will demo L shoulder AROM to be [facility] t/o for performance of ADLs and IADLs. Baseline:  Goal status: INITIAL  2.  Pt will be able to perform ADLs and IADLs without significant pain and difficulty.  Baseline:  Goal status: INITIAL  3.  Pt will be able to perform her normal reaching and overhead activities without significant pain and limitation.   Baseline:  Goal status: INITIAL  4.  Pt will demo at least 4+/5 MMT strength t/o L shoulder for functional lifting/carrying and to assist with returning to recreational activities.  Baseline:  Goal status: INITIAL    PLAN:  PT FREQUENCY: 1-2x/wk x 3 weeks and 2x/wk afterward  PT DURATION: other: 12-16 weeks  PLANNED INTERVENTIONS: Therapeutic exercises, Therapeutic activity, Neuromuscular re-education, Patient/Family education, Self Care, Joint mobilization, Aquatic Therapy, Dry Needling, Electrical stimulation, Spinal mobilization, Cryotherapy, Moist heat, Taping, Ultrasound, Manual therapy, and Re-evaluation  PLAN FOR NEXT SESSION:   Cont per biceps tenodesis protocol.  Op note indicates: She will begin  active range of motion at 2 weeks.  At this time she will plan for early and aggressive active range of motion without any rotator cuff restrictions.  ice as needed.    Audie Clear III PT, DPT 05/17/22 4:48 PM

## 2022-05-21 ENCOUNTER — Encounter (HOSPITAL_BASED_OUTPATIENT_CLINIC_OR_DEPARTMENT_OTHER): Payer: Self-pay | Admitting: Orthopaedic Surgery

## 2022-05-22 ENCOUNTER — Other Ambulatory Visit (HOSPITAL_BASED_OUTPATIENT_CLINIC_OR_DEPARTMENT_OTHER): Payer: Self-pay

## 2022-05-22 ENCOUNTER — Ambulatory Visit (INDEPENDENT_AMBULATORY_CARE_PROVIDER_SITE_OTHER): Payer: Managed Care, Other (non HMO)

## 2022-05-22 ENCOUNTER — Ambulatory Visit (INDEPENDENT_AMBULATORY_CARE_PROVIDER_SITE_OTHER): Payer: Managed Care, Other (non HMO) | Admitting: Orthopaedic Surgery

## 2022-05-22 DIAGNOSIS — M25512 Pain in left shoulder: Secondary | ICD-10-CM

## 2022-05-22 MED ORDER — MELOXICAM 15 MG PO TABS
15.0000 mg | ORAL_TABLET | Freq: Every day | ORAL | 0 refills | Status: DC
  Filled 2022-05-22: qty 7, 7d supply, fill #0

## 2022-05-22 NOTE — Progress Notes (Signed)
Post Operative Evaluation    Procedure/Date of Surgery: Left shoulder arthroscopy with biceps tenodesis and subacromial decompression 04/30/22  Interval History:   Presents today for follow-up of the above procedure.  She did have an incident where she ran into a car door and since that time has had shoulder pain and increased soreness.  The pain is radiating to her neck.  Her motion is more limited at this time.  PMH/PSH/Family History/Social History/Meds/Allergies:    Past Medical History:  Diagnosis Date   Alcohol abuse    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   Hyponatremia 08/15/2020   Macrocytosis 10/29/2020   Portal hypertension (HCC) 08/14/2020   Rotator cuff disorder    Past Surgical History:  Procedure Laterality Date   ANKLE SURGERY     x 2   BICEPT TENODESIS  04/30/2022   Procedure: BICEPS TENODESIS LEFT SHOULDER;  Surgeon: Huel Cote, MD;  Location: [facility];  Service: Orthopedics;;   CHEST TUBE INSERTION Left 06/27/2021   Procedure: CHEST TUBE INSERTION;  Surgeon: Tomma Lightning, MD;  Location: [facility];  Service: Pulmonary;  Laterality: Left;   PARACENTESIS     SHOULDER ARTHROSCOPY WITH SUBACROMIAL DECOMPRESSION Left 04/30/2022   Procedure: LEFT SHOULDER ARTHROSCOPY WITH SUBACROMIAL DECOMPRESSION;  Surgeon: Huel Cote, MD;  Location: [facility];  Service: Orthopedics;  Laterality: Left;   THORACENTESIS N/A 06/18/2021   Procedure: Alanson Puls;  Surgeon: [name], MD;  Location: [facility];  Service: Pulmonary;  Laterality: N/A;   Social History   Socioeconomic History   Marital status: Married    Spouse name: Not on file   Number of children: Not on file   Years of education: Not on file   Highest education level: Not on file  Occupational History   Not on file  Tobacco Use   Smoking status: Former    Packs/day: 0.50    Years: 10.00    Total  pack years: 5.00    Types: Cigarettes   Smokeless tobacco: Never  Vaping Use   Vaping Use: Former  Substance and Sexual Activity   Alcohol use: Not Currently   Drug use: Never   Sexual activity: Yes    Birth control/protection: Post-menopausal  Other Topics Concern   Not on file  Social History Narrative   Not on file   Social Determinants of Health   Financial Resource Strain: Not on file  Food Insecurity: Not on file  Transportation Needs: Not on file  Physical Activity: Not on file  Stress: Not on file  Social Connections: Not on file   Family History  Problem Relation Age of Onset   Pancreatic cancer Paternal Grandfather    Skin cancer Father    Huntington's disease Mother    Allergies  Allergen Reactions   Clemastine Rash and Other (See Comments)    Tavist     Hydrocodone Nausea And Vomiting   Ibuprofen Other (See Comments)    Intestinal reactions    Other Rash and Other (See Comments)    Albertson's Dayhist-D/generic of Tavist- (reaction to antihistamine that is no longer on the market)   Oxycodone Nausea Only and Other (See Comments)    Nausea to opioids     Current Outpatient Medications  Medication Sig Dispense Refill   meloxicam (MOBIC) 15  MG tablet Take 1 tablet (15 mg total) by mouth daily. 7 tablet 0   aspirin EC 325 MG tablet Take 1 tablet (325 mg total) by mouth daily. 30 tablet 0   clindamycin (CLINDAGEL) 1 % gel Apply topically 2 (two) times daily. 30 g 11   clobetasol ointment (TEMOVATE) 0.05 % Apply 1 application topically 2 (two) times daily as needed (psoriasis- bilateral legs).     furosemide (LASIX) 20 MG tablet Take 20 mg by mouth every morning.     lidocaine (XYLOCAINE) 2 % solution Use as directed 5 mLs in the mouth or throat every 3 (three) hours as needed (tooth pain). (Patient not taking: Reported on 05/03/2022)     LORazepam (ATIVAN) 0.5 MG tablet Take by mouth. (Patient not taking: Reported on 05/03/2022)     Potassium Chloride ER  20 MEQ TBCR Take 20 mEq by mouth every morning.     spironolactone (ALDACTONE) 100 MG tablet Take 1 tablet (100 mg total) by mouth daily. 30 tablet 3   triamcinolone cream (KENALOG) 0.1 % Apply 1 application topically daily as needed (psoriasis- bilateral legs).     TYLENOL CHILDRENS CHEWABLES 160 MG CHEW Chew 80 mg by mouth daily as needed (for headaches).     No current facility-administered medications for this visit.   No results found.  Review of Systems:   A ROS was performed including pertinent positives and negatives as documented in the HPI.   Musculoskeletal Exam:    There were no vitals taken for this visit.  Left shoulder incisions are well-appearing without erythema or drainage.  Active forward elevation is to 70 degrees with pain.  External rotation at the side is to 30 degrees.  There is tenderness about the trapezius.  2+ radial pulse with distal neurosensory exam intact  Imaging:      I personally reviewed and interpreted the radiographs.   Assessment:   46 year old female who is 3 weeks status post left shoulder biceps tenodesis and subacromial decompression.  Today she is favoring the shoulder with significant scapular protraction.  This time I do believe she is having increased inflammation consistent with possible early freezing which may benefit from a 1 week course of Mobic.  We will plan to proceed with this.  I would like to see her back in 2 weeks for reassessment.  I have asked that she cancel her upcoming PT session and resume this on Monday.  -Return to clinic in 2 weeks for reassessment     I personally saw and evaluated the patient, and participated in the management and treatment plan.  Huel Cote, MD Attending Physician, Orthopedic Surgery  This document was dictated using Dragon voice recognition software. A reasonable attempt at proof reading has been made to minimize errors.

## 2022-05-23 ENCOUNTER — Ambulatory Visit (HOSPITAL_BASED_OUTPATIENT_CLINIC_OR_DEPARTMENT_OTHER): Payer: Managed Care, Other (non HMO) | Admitting: Physical Therapy

## 2022-05-23 ENCOUNTER — Encounter (HOSPITAL_BASED_OUTPATIENT_CLINIC_OR_DEPARTMENT_OTHER): Payer: Self-pay

## 2022-05-27 ENCOUNTER — Encounter (HOSPITAL_BASED_OUTPATIENT_CLINIC_OR_DEPARTMENT_OTHER): Payer: Self-pay | Admitting: Physical Therapy

## 2022-05-27 ENCOUNTER — Ambulatory Visit (HOSPITAL_BASED_OUTPATIENT_CLINIC_OR_DEPARTMENT_OTHER): Payer: Managed Care, Other (non HMO) | Admitting: Physical Therapy

## 2022-05-27 DIAGNOSIS — M25612 Stiffness of left shoulder, not elsewhere classified: Secondary | ICD-10-CM

## 2022-05-27 DIAGNOSIS — M6281 Muscle weakness (generalized): Secondary | ICD-10-CM

## 2022-05-27 DIAGNOSIS — M25512 Pain in left shoulder: Secondary | ICD-10-CM | POA: Diagnosis not present

## 2022-05-27 NOTE — Therapy (Signed)
OUTPATIENT PHYSICAL THERAPY TREATMENT NOTE   Patient Name: Rebecca Bonilla MRN: [id number] DOB:01/10/1976, 46 y.o., female Today's Date: 05/27/2022   PT End of Session - 05/27/22 1241     Visit Number 5    Number of Visits 28    Date for PT Re-Evaluation 07/26/22    Authorization Type [employer]    PT Start Time 1015    PT Stop Time 1058    PT Time Calculation (min) 43 min    Activity Tolerance Patient tolerated treatment well    Behavior During Therapy [facility] for tasks assessed/performed              Past Medical History:  Diagnosis Date   Alcohol abuse    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   Hyponatremia 08/15/2020   Macrocytosis 10/29/2020   Portal hypertension (HCC) 08/14/2020   Rotator cuff disorder    Past Surgical History:  Procedure Laterality Date   ANKLE SURGERY     x 2   BICEPT TENODESIS  04/30/2022   Procedure: BICEPS TENODESIS LEFT SHOULDER;  Surgeon: Huel Cote, MD;  Location: [facility];  Service: Orthopedics;;   CHEST TUBE INSERTION Left 06/27/2021   Procedure: CHEST TUBE INSERTION;  Surgeon: Tomma Lightning, MD;  Location: [facility];  Service: Pulmonary;  Laterality: Left;   PARACENTESIS     SHOULDER ARTHROSCOPY WITH SUBACROMIAL DECOMPRESSION Left 04/30/2022   Procedure: LEFT SHOULDER ARTHROSCOPY WITH SUBACROMIAL DECOMPRESSION;  Surgeon: Huel Cote, MD;  Location: [facility];  Service: Orthopedics;  Laterality: Left;   THORACENTESIS N/A 06/18/2021   Procedure: Alanson Puls;  Surgeon: [name], MD;  Location: [facility];  Service: Pulmonary;  Laterality: N/A;   Patient Active Problem List   Diagnosis Date Noted   Nontraumatic incomplete tear of left rotator cuff    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   Recurrent left pleural effusion 06/26/2021   Flank pain 06/09/2021   Hematuria 06/09/2021   Ascites 04/02/2021   Secondary esophageal varices without bleeding (HCC) 12/19/2020    History of colon polyps 12/19/2020   SOB (shortness of breath) 10/30/2020   Pleural effusion 10/29/2020   Macrocytosis 10/29/2020   Hypokalemia 10/29/2020   Hypophosphatemia 10/29/2020   Hypomagnesemia 10/29/2020   Leukocytosis 10/12/2020   Thrombocytopenia (HCC) 10/12/2020   Anemia, chronic disease 09/20/2020   Alcoholism (HCC) 08/25/2020   Hyponatremia 08/15/2020   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   Portal hypertension (HCC) 08/14/2020   Alcoholic cirrhosis of liver with ascites (HCC) 08/14/2020   Gallbladder sludge 08/14/2020   Psoriasis 08/09/2020    REFERRING PROVIDER: Huel Cote, MD  REFERRING DIAG: [phone] (ICD-10-CM) - Rotator cuff tendinitis, left  s/p L shoulder arthroscopic biceps tenodesis, debridement, and subacromial decompression  THERAPY DIAG:  Left shoulder pain, unspecified chronicity  Stiffness of left shoulder, not elsewhere classified  Muscle weakness (generalized)  Rationale for Evaluation and Treatment Rehabilitation  ONSET DATE: End of January 2023 ; DOS 04/30/2022  SUBJECTIVE:  SUBJECTIVE STATEMENT: Pt is 2 weeks and 3 days s/p L shoulder arthroscopic biceps tenodesis, subacromial decompression, and extensive debridement.  Pt is sling dependent and is limited with all self care activities and ADLs/IADLs.   Pt states her shoulder is doing better since prior incident.  Pt still gets some popping but not like the painful pop earlier.  Pt saw MD on Monday who removed stitches.  Pt states MD was pleased with shoulder.  MD note indicated pt can begin active assisted range of motion in addition to passive and passive assisted range of motion.  Pt denies any adverse effects after prior Rx.  Pt used ice after prior Rx.  Pt reports compliance with HEP.      PERTINENT HISTORY: L shoulder arthroscopic biceps tenodesis, subacromial decompression, and extensive debridement on 04/30/2022.  Shoulder AROM to begin at 2 weeks. Cirrhosis of liver   PAIN:  Are you having pain? Yes NPRS:  Current:  3/10, Worst:  5/10, Best:  3/10 Location:  L anterior shoulder  PRECAUTIONS: Other: per surgical protocol.  No AROM for 2 weeks  WEIGHT BEARING RESTRICTIONS: Yes L UE  FALLS:  Has patient fallen in last 6 months? No    OCCUPATION: Pt is an attorney  PLOF: Independent.  Pt was able to perform all of her ADLs/IADLs, reaching activities, overhead activities without limitation or pain.  Pt was able to perform her normal work and lifting activities without limitation or pain.  Pt enjoys swimming  PATIENT GOALS:improve strength, to be able to swim, return to normal activities, to be able to pick up and lower pots and pans  OBJECTIVE:   DIAGNOSTIC FINDINGS:  Pt is post op and had a MRI prior to surgery.     TODAY'S TREATMENT:  11/20 PROM into all planes; Trigger point release to upper traps  Wand flexion 2x10  Supine ABC 1 lbs 2x10  Sidelying ER 3x10  Row to neutral 2x10 yellow.      Last visit :  OBSERVATION: Portals intact with steri strips over them.   -Reviewed pt presentation, HEP compliance, response to prior Rx, and pain level.   -Pt received L shoulder PROM in flexion, scaption, ER, and IR per protocol ranges and pt tolerance   -Pt performed : Scap retractions x20  supine wand flexion 2x10 reps w/n protocol and pt tolerance.  Pt instructed to not perform into a painful or tight range and she should not feel tension with AAROM.   Pt received a HEP handout and was educated in correct form and appropriate frequency.  Pt instructed she should not have pain with HEP and should not perform into tension or pain.   PATIENT EDUCATION: Education details:  Educated pt in post op protocol and restrictions, sling compliance, POC, HEP,  and MD instructions.  Person educated: Patient Education method: Explanation, Demonstration, Tactile cues, Verbal cues, and Handouts Education comprehension: verbalized understanding, returned demonstration, verbal cues required, tactile cues required, and needs further education  HOME EXERCISE PROGRAM: Access Code: [email or web address] URL: [email or web address] Date: 05/03/2022 Prepared by: Aaron Edelman  Updated HEP: Supine wand flexion AAROM 2x/day, 1-2 sets of 10 reps  ASSESSMENT:  CLINICAL IMPRESSION: Patient did well with AROM with her exercises so we advanced her slightly. She tolerated well. Sh ehad no increase in pain> She is still using her weights at home. Some of the soreness has gone away since she she hit her shoulder on the door. We updated her HEP. She had full PROM by the  end of the session today.       OBJECTIVE IMPAIRMENTS: decreased activity tolerance, decreased ROM, decreased strength, hypomobility, impaired flexibility, impaired UE functional use, and pain.   ACTIVITY LIMITATIONS: carrying, lifting, bathing, dressing, reach over head, and hygiene/grooming  PARTICIPATION LIMITATIONS: meal prep, cleaning, laundry, driving, shopping, community activity, and occupation  PERSONAL FACTORS: none  REHAB POTENTIAL: Good  CLINICAL DECISION MAKING: Stable/uncomplicated  EVALUATION COMPLEXITY: Low   GOALS:  SHORT TERM GOALS:   Pt will be independent and compliant with HEP for improved pain, ROM, strength, and function.  Baseline: Goal status: INITIAL Target date:  05/31/2022   2.  Pt will tolerate initiation of AAROM without adverse effects for improved shoulder mobility Baseline:  Goal status: INITIAL Target date:  05/16/2022   3.  Pt will demo L shoulder PROM to at least 110 deg in flexion and 35-40 deg in ER for improved ROM and stiffness. Baseline:  Goal status: INITIAL Target date:  05/24/2022     4.  Pt will demo AAROM to at least 120 deg and  ER to 45 deg for improved shoulder mobility and function. Baseline:  Goal status: INITIAL Target date:  05/31/2022  5.  Pt will wean out of sling without adverse effects per MD instruction.  Baseline:  Goal status: INITIAL Target date:  05/31/2022  6.  Pt will be able to actively elevate L UE > 90 deg without significant pain. Baseline:  Goal status: INITIAL Target date:  06/07/2022  7.  Pt will be able to perform self care activities with no > than minimal difficulty.   Goal status: INITIAL  Target date:  06/28/2022  8.  Pt will be able to perform her normal work activities without adverse effects.    Goal status: INITIAL  Target date:  06/14/2022    LONG TERM GOALS: Target date: 08/22/2022   Pt will demo L shoulder AROM to be [facility] t/o for performance of ADLs and IADLs. Baseline:  Goal status: INITIAL  2.  Pt will be able to perform ADLs and IADLs without significant pain and difficulty.  Baseline:  Goal status: INITIAL  3.  Pt will be able to perform her normal reaching and overhead activities without significant pain and limitation.   Baseline:  Goal status: INITIAL  4.  Pt will demo at least 4+/5 MMT strength t/o L shoulder for functional lifting/carrying and to assist with returning to recreational activities.  Baseline:  Goal status: INITIAL    PLAN:  PT FREQUENCY: 1-2x/wk x 3 weeks and 2x/wk afterward  PT DURATION: other: 12-16 weeks  PLANNED INTERVENTIONS: Therapeutic exercises, Therapeutic activity, Neuromuscular re-education, Patient/Family education, Self Care, Joint mobilization, Aquatic Therapy, Dry Needling, Electrical stimulation, Spinal mobilization, Cryotherapy, Moist heat, Taping, Ultrasound, Manual therapy, and Re-evaluation  PLAN FOR NEXT SESSION:   Cont per biceps tenodesis protocol.  Op note indicates: She will begin active range of motion at 2 weeks.  At this time she will plan for early and aggressive active range of motion without any  rotator cuff restrictions.  ice as needed.    Lorayne Bender PT DPT  05/27/22 12:42 PM

## 2022-05-29 ENCOUNTER — Encounter (HOSPITAL_BASED_OUTPATIENT_CLINIC_OR_DEPARTMENT_OTHER): Payer: Self-pay | Admitting: Physical Therapy

## 2022-05-29 ENCOUNTER — Ambulatory Visit (HOSPITAL_BASED_OUTPATIENT_CLINIC_OR_DEPARTMENT_OTHER): Payer: Managed Care, Other (non HMO) | Admitting: Physical Therapy

## 2022-05-29 DIAGNOSIS — M25512 Pain in left shoulder: Secondary | ICD-10-CM

## 2022-05-29 DIAGNOSIS — M25612 Stiffness of left shoulder, not elsewhere classified: Secondary | ICD-10-CM

## 2022-05-29 DIAGNOSIS — M6281 Muscle weakness (generalized): Secondary | ICD-10-CM

## 2022-05-29 NOTE — Therapy (Signed)
OUTPATIENT PHYSICAL THERAPY TREATMENT NOTE   Patient Name: Rebecca Bonilla MRN: [id number] DOB:[birth year], 46 y.o., female Today's Date: 05/27/2022   PT End of Session - 05/27/22 1241     Visit Number 5    Number of Visits 28    Date for PT Re-Evaluation 07/26/22    Authorization Type [employer]    PT Start Time 1015    PT Stop Time 1058    PT Time Calculation (min) 43 min    Activity Tolerance Patient tolerated treatment well    Behavior During Therapy [facility] for tasks assessed/performed              Past Medical History:  Diagnosis Date   Alcohol abuse    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   Hyponatremia 08/15/2020   Macrocytosis 10/29/2020   Portal hypertension (HCC) 08/14/2020   Rotator cuff disorder    Past Surgical History:  Procedure Laterality Date   ANKLE SURGERY     x 2   BICEPT TENODESIS  04/30/2022   Procedure: BICEPS TENODESIS LEFT SHOULDER;  Surgeon: Huel Cote, MD;  Location: [facility];  Service: Orthopedics;;   CHEST TUBE INSERTION Left 06/27/2021   Procedure: CHEST TUBE INSERTION;  Surgeon: Tomma Lightning, MD;  Location: [facility];  Service: Pulmonary;  Laterality: Left;   PARACENTESIS     SHOULDER ARTHROSCOPY WITH SUBACROMIAL DECOMPRESSION Left 04/30/2022   Procedure: LEFT SHOULDER ARTHROSCOPY WITH SUBACROMIAL DECOMPRESSION;  Surgeon: Huel Cote, MD;  Location: [facility];  Service: Orthopedics;  Laterality: Left;   THORACENTESIS N/A 06/18/2021   Procedure: Alanson Puls;  Surgeon: [name], MD;  Location: [facility];  Service: Pulmonary;  Laterality: N/A;   Patient Active Problem List   Diagnosis Date Noted   Nontraumatic incomplete tear of left rotator cuff    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   Recurrent left pleural effusion 06/26/2021   Flank pain 06/09/2021   Hematuria 06/09/2021   Ascites 04/02/2021   Secondary esophageal varices without bleeding (HCC) 12/19/2020    History of colon polyps 12/19/2020   SOB (shortness of breath) 10/30/2020   Pleural effusion 10/29/2020   Macrocytosis 10/29/2020   Hypokalemia 10/29/2020   Hypophosphatemia 10/29/2020   Hypomagnesemia 10/29/2020   Leukocytosis 10/12/2020   Thrombocytopenia (HCC) 10/12/2020   Anemia, chronic disease 09/20/2020   Alcoholism (HCC) 08/25/2020   Hyponatremia 08/15/2020   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   Portal hypertension (HCC) 08/14/2020   Alcoholic cirrhosis of liver with ascites (HCC) 08/14/2020   Gallbladder sludge 08/14/2020   Psoriasis 08/09/2020    REFERRING PROVIDER: Huel Cote, MD  REFERRING DIAG: [phone] (ICD-10-CM) - Rotator cuff tendinitis, left  s/p L shoulder arthroscopic biceps tenodesis, debridement, and subacromial decompression  THERAPY DIAG:  Left shoulder pain, unspecified chronicity  Stiffness of left shoulder, not elsewhere classified  Muscle weakness (generalized)  Rationale for Evaluation and Treatment Rehabilitation  ONSET DATE: End of January 2023 ; DOS 04/30/2022  SUBJECTIVE:  SUBJECTIVE STATEMENT: Pt is 2 weeks and 3 days s/p L shoulder arthroscopic biceps tenodesis, subacromial decompression, and extensive debridement.  Pt is sling dependent and is limited with all self care activities and ADLs/IADLs.   Pt states her shoulder is doing better since prior incident.  Pt still gets some popping but not like the painful pop earlier.  Pt saw MD on Monday who removed stitches.  Pt states MD was pleased with shoulder.  MD note indicated pt can begin active assisted range of motion in addition to passive and passive assisted range of motion.  Pt denies any adverse effects after prior Rx.  Pt used ice after prior Rx.  Pt reports compliance with HEP.      PERTINENT HISTORY: L shoulder arthroscopic biceps tenodesis, subacromial decompression, and extensive debridement on 04/30/2022.  Shoulder AROM to begin at 2 weeks. Cirrhosis of liver   PAIN:  Are you having pain? Yes NPRS:  Current:  3/10, Worst:  5/10, Best:  3/10 Location:  L anterior shoulder  PRECAUTIONS: Other: per surgical protocol.  No AROM for 2 weeks  WEIGHT BEARING RESTRICTIONS: Yes L UE  FALLS:  Has patient fallen in last 6 months? No    OCCUPATION: Pt is an attorney  PLOF: Independent.  Pt was able to perform all of her ADLs/IADLs, reaching activities, overhead activities without limitation or pain.  Pt was able to perform her normal work and lifting activities without limitation or pain.  Pt enjoys swimming  PATIENT GOALS:improve strength, to be able to swim, return to normal activities, to be able to pick up and lower pots and pans  OBJECTIVE:   DIAGNOSTIC FINDINGS:  Pt is post op and had a MRI prior to surgery.     TODAY'S TREATMENT:  11/22 PROM into all planes; Trigger point release to upper traps  Wand flexion 2x10  Supine ABC 1 lbs 2x10  Sidelying ER 3x10  Row to neutral 2x10 red Shoulder extension to neutral yellow 2x10   Attempted wall flexion with towel but the patient had pain   11/20 PROM into all planes; Trigger point release to upper traps  Wand flexion 2x10  Supine ABC 1 lbs 2x10  Sidelying ER 3x10  Row to neutral 2x10 yellow.      Last visit :  OBSERVATION: Portals intact with steri strips over them.   -Reviewed pt presentation, HEP compliance, response to prior Rx, and pain level.   -Pt received L shoulder PROM in flexion, scaption, ER, and IR per protocol ranges and pt tolerance   -Pt performed : Scap retractions x20  supine wand flexion 2x10 reps w/n protocol and pt tolerance.  Pt instructed to not perform into a painful or tight range and she should not feel tension with AAROM.   Pt received a HEP handout and was  educated in correct form and appropriate frequency.  Pt instructed she should not have pain with HEP and should not perform into tension or pain.   PATIENT EDUCATION: Education details:  Educated pt in post op protocol and restrictions, sling compliance, POC, HEP, and MD instructions.  Person educated: Patient Education method: Explanation, Demonstration, Tactile cues, Verbal cues, and Handouts Education comprehension: verbalized understanding, returned demonstration, verbal cues required, tactile cues required, and needs further education  HOME EXERCISE PROGRAM: Access Code: [email or web address] URL: [email or web address] Date: 05/03/2022 Prepared by: Aaron Edelman  Updated HEP: Supine wand flexion AAROM 2x/day, 1-2 sets of 10 reps  ASSESSMENT:  CLINICAL IMPRESSION: The patient continues to make  great progress. She has full passive ROM without pain. We kept her exercises consistent today. She has also started working on general exercises at the gym. We attempted closed chained wall flexion but she had some pain so we did not continue. Therapy will continue to progress as tolerated.    OBJECTIVE IMPAIRMENTS: decreased activity tolerance, decreased ROM, decreased strength, hypomobility, impaired flexibility, impaired UE functional use, and pain.   ACTIVITY LIMITATIONS: carrying, lifting, bathing, dressing, reach over head, and hygiene/grooming  PARTICIPATION LIMITATIONS: meal prep, cleaning, laundry, driving, shopping, community activity, and occupation  PERSONAL FACTORS: none  REHAB POTENTIAL: Good  CLINICAL DECISION MAKING: Stable/uncomplicated  EVALUATION COMPLEXITY: Low   GOALS:  SHORT TERM GOALS:   Pt will be independent and compliant with HEP for improved pain, ROM, strength, and function.  Baseline: Goal status: INITIAL Target date:  05/31/2022   2.  Pt will tolerate initiation of AAROM without adverse effects for improved shoulder mobility Baseline:  Goal  status: INITIAL Target date:  05/16/2022   3.  Pt will demo L shoulder PROM to at least 110 deg in flexion and 35-40 deg in ER for improved ROM and stiffness. Baseline:  Goal status: INITIAL Target date:  05/24/2022     4.  Pt will demo AAROM to at least 120 deg and ER to 45 deg for improved shoulder mobility and function. Baseline:  Goal status: INITIAL Target date:  05/31/2022  5.  Pt will wean out of sling without adverse effects per MD instruction.  Baseline:  Goal status: INITIAL Target date:  05/31/2022  6.  Pt will be able to actively elevate L UE > 90 deg without significant pain. Baseline:  Goal status: INITIAL Target date:  06/07/2022  7.  Pt will be able to perform self care activities with no > than minimal difficulty.   Goal status: INITIAL  Target date:  06/28/2022  8.  Pt will be able to perform her normal work activities without adverse effects.    Goal status: INITIAL  Target date:  06/14/2022    LONG TERM GOALS: Target date: 08/22/2022   Pt will demo L shoulder AROM to be [facility] t/o for performance of ADLs and IADLs. Baseline:  Goal status: INITIAL  2.  Pt will be able to perform ADLs and IADLs without significant pain and difficulty.  Baseline:  Goal status: INITIAL  3.  Pt will be able to perform her normal reaching and overhead activities without significant pain and limitation.   Baseline:  Goal status: INITIAL  4.  Pt will demo at least 4+/5 MMT strength t/o L shoulder for functional lifting/carrying and to assist with returning to recreational activities.  Baseline:  Goal status: INITIAL    PLAN:  PT FREQUENCY: 1-2x/wk x 3 weeks and 2x/wk afterward  PT DURATION: other: 12-16 weeks  PLANNED INTERVENTIONS: Therapeutic exercises, Therapeutic activity, Neuromuscular re-education, Patient/Family education, Self Care, Joint mobilization, Aquatic Therapy, Dry Needling, Electrical stimulation, Spinal mobilization, Cryotherapy, Moist heat, Taping,  Ultrasound, Manual therapy, and Re-evaluation  PLAN FOR NEXT SESSION:   Cont per biceps tenodesis protocol.  Op note indicates: She will begin active range of motion at 2 weeks.  At this time she will plan for early and aggressive active range of motion without any rotator cuff restrictions.  ice as needed.    Lorayne Bender PT DPT  05/27/22 12:42 PM

## 2022-06-04 ENCOUNTER — Ambulatory Visit (HOSPITAL_BASED_OUTPATIENT_CLINIC_OR_DEPARTMENT_OTHER): Payer: Managed Care, Other (non HMO) | Admitting: Physical Therapy

## 2022-06-04 DIAGNOSIS — M25512 Pain in left shoulder: Secondary | ICD-10-CM

## 2022-06-04 DIAGNOSIS — M6281 Muscle weakness (generalized): Secondary | ICD-10-CM

## 2022-06-04 DIAGNOSIS — M25612 Stiffness of left shoulder, not elsewhere classified: Secondary | ICD-10-CM

## 2022-06-04 NOTE — Therapy (Signed)
OUTPATIENT PHYSICAL THERAPY TREATMENT NOTE   Patient Name: Rebecca Bonilla MRN: [id number] DOB:06/27/1976, 46 y.o., female Today's Date: 06/05/2022   PT End of Session - 06/04/22 1601     Visit Number 7    Number of Visits 28    Date for PT Re-Evaluation 07/26/22    Authorization Type [employer]    PT Start Time 1523    PT Stop Time 1557   Pt had to take a call for work.   PT Time Calculation (min) 34 min    Activity Tolerance Patient tolerated treatment well    Behavior During Therapy [facility] for tasks assessed/performed              Past Medical History:  Diagnosis Date   Alcohol abuse    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   Hyponatremia 08/15/2020   Macrocytosis 10/29/2020   Portal hypertension (HCC) 08/14/2020   Rotator cuff disorder    Past Surgical History:  Procedure Laterality Date   ANKLE SURGERY     x 2   BICEPT TENODESIS  04/30/2022   Procedure: BICEPS TENODESIS LEFT SHOULDER;  Surgeon: Huel Cote, MD;  Location: [facility];  Service: Orthopedics;;   CHEST TUBE INSERTION Left 06/27/2021   Procedure: CHEST TUBE INSERTION;  Surgeon: Tomma Lightning, MD;  Location: [facility];  Service: Pulmonary;  Laterality: Left;   PARACENTESIS     SHOULDER ARTHROSCOPY WITH SUBACROMIAL DECOMPRESSION Left 04/30/2022   Procedure: LEFT SHOULDER ARTHROSCOPY WITH SUBACROMIAL DECOMPRESSION;  Surgeon: Huel Cote, MD;  Location:  [facility]ERY [facility];  Service: Orthopedics;  Laterality: Left;   THORACENTESIS N/A 06/18/2021   Procedure: Alanson Puls;  Surgeon: [name], MD;  Location: [facility];  Service: Pulmonary;  Laterality: N/A;   Patient Active Problem List   Diagnosis Date Noted   Nontraumatic incomplete tear of left rotator cuff    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   Recurrent left pleural effusion 06/26/2021   Flank pain 06/09/2021   Hematuria 06/09/2021   Ascites 04/02/2021   Secondary esophageal varices without  bleeding (HCC) 12/19/2020   History of colon polyps 12/19/2020   SOB (shortness of breath) 10/30/2020   Pleural effusion 10/29/2020   Macrocytosis 10/29/2020   Hypokalemia 10/29/2020   Hypophosphatemia 10/29/2020   Hypomagnesemia 10/29/2020   Leukocytosis 10/12/2020   Thrombocytopenia (HCC) 10/12/2020   Anemia, chronic disease 09/20/2020   Alcoholism (HCC) 08/25/2020   Hyponatremia 08/15/2020   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   Portal hypertension (HCC) 08/14/2020   Alcoholic cirrhosis of liver with ascites (HCC) 08/14/2020   Gallbladder sludge 08/14/2020   Psoriasis 08/09/2020    REFERRING PROVIDER: Huel Cote, MD  REFERRING DIAG: [phone] (ICD-10-CM) - Rotator cuff tendinitis, left  s/p L shoulder arthroscopic biceps tenodesis, debridement, and subacromial decompression  THERAPY DIAG:  Left shoulder pain, unspecified chronicity  Stiffness of left shoulder, not elsewhere classified  Muscle weakness (generalized)  Rationale for Evaluation and Treatment Rehabilitation  ONSET DATE: End of January 2023 ; DOS 04/30/2022  SUBJECTIVE:  SUBJECTIVE STATEMENT: Pt is 5 weeks s/p L shoulder arthroscopic biceps tenodesis, subacromial decompression, and extensive debridement.  Pt has returned to work.  She states things are harder at work, but is doing well overall.  Pt uses a light sling at work.  Pt states she does have some popping at work.  Pt reports improved performance of self care activities including putting her hair up.  Pt reports compliance with HEP.  Pt reports she was sore after prior Rx.  Pt used ice and felt fine the following day.  She occasionally has burning pain and some pin/needles in L shoulder.   PERTINENT HISTORY: L shoulder arthroscopic biceps tenodesis,  subacromial decompression, and extensive debridement on 04/30/2022.  Shoulder AROM to begin at 2 weeks. Cirrhosis of liver   PAIN:  Are you having pain? Yes NPRS:  Current:  0/10, Worst:  5/10, Best:  3/10 Location:  L anterior shoulder  PRECAUTIONS: Other: per surgical protocol.  No AROM for 2 weeks  WEIGHT BEARING RESTRICTIONS: Yes L UE  FALLS:  Has patient fallen in last 6 months? No    OCCUPATION: Pt is an attorney  PLOF: Independent.  Pt was able to perform all of her ADLs/IADLs, reaching activities, overhead activities without limitation or pain.  Pt was able to perform her normal work and lifting activities without limitation or pain.  Pt enjoys swimming  PATIENT GOALS:improve strength, to be able to swim, return to normal activities, to be able to pick up and lower pots and pans  OBJECTIVE:   DIAGNOSTIC FINDINGS:  Pt is post op and had a MRI prior to surgery.     TODAY'S TREATMENT:   Therapeutic Exercise: -Reviewed current function, pain level, response to prior Rx, and HEP compliance.   -Pt received L shoulder PROM in flexion, scaption, ER, and IR per protocol ranges and pt tolerance -Pt performed:   Wand flexion 2x10    Supine shoulder flexion AROM approx 5 reps   Sidelying ER 3x10       PATIENT EDUCATION: Education details:  Educated pt in post op protocol and restrictions, POC, HEP, and exercise form.  Person educated: Patient Education method: Explanation, Demonstration, Tactile cues, Verbal cues, and Handouts Education comprehension: verbalized understanding, returned demonstration, verbal cues required, tactile cues required, and needs further education  HOME EXERCISE PROGRAM: Access Code: [email or web address] URL: [email or web address] Date: 05/03/2022 Prepared by: Aaron Edelman   ASSESSMENT:  CLINICAL IMPRESSION: PT started Pt treatment 8 mins late and Pt had to leave early due to a phone call for work, therefore treatment time was limited  today.  Pt is improving with function and tolerance to activity as evidenced by subjective reports.  Pt is progressing well with L shoulder PROM per protocol.  Pt performed supine wand flexion and S/L ER well with cuing and instruction in correct form and positioning.  PT attempted supine flexion AROM though pt had pain and PT had pt stop that exercise.  Pt responded well to Rx having no increased pain after Rx.  Pt should cont to benefit from cont skilled PT services per protocol to address ongoing goals and impairments and to restore PLOF.      OBJECTIVE IMPAIRMENTS: decreased activity tolerance, decreased ROM, decreased strength, hypomobility, impaired flexibility, impaired UE functional use, and pain.   ACTIVITY LIMITATIONS: carrying, lifting, bathing, dressing, reach over head, and hygiene/grooming  PARTICIPATION LIMITATIONS: meal prep, cleaning, laundry, driving, shopping, community activity, and occupation  PERSONAL FACTORS: none  REHAB POTENTIAL: Good  CLINICAL  DECISION MAKING: Stable/uncomplicated  EVALUATION COMPLEXITY: Low   GOALS:  SHORT TERM GOALS:   Pt will be independent and compliant with HEP for improved pain, ROM, strength, and function.  Baseline: Goal status: INITIAL Target date:  05/31/2022   2.  Pt will tolerate initiation of AAROM without adverse effects for improved shoulder mobility Baseline:  Goal status: INITIAL Target date:  05/16/2022   3.  Pt will demo L shoulder PROM to at least 110 deg in flexion and 35-40 deg in ER for improved ROM and stiffness. Baseline:  Goal status: INITIAL Target date:  05/24/2022     4.  Pt will demo AAROM to at least 120 deg and ER to 45 deg for improved shoulder mobility and function. Baseline:  Goal status: INITIAL Target date:  05/31/2022  5.  Pt will wean out of sling without adverse effects per MD instruction.  Baseline:  Goal status: INITIAL Target date:  05/31/2022  6.  Pt will be able to actively elevate  L UE > 90 deg without significant pain. Baseline:  Goal status: INITIAL Target date:  06/07/2022  7.  Pt will be able to perform self care activities with no > than minimal difficulty.   Goal status: INITIAL  Target date:  06/28/2022  8.  Pt will be able to perform her normal work activities without adverse effects.    Goal status: INITIAL  Target date:  06/14/2022    LONG TERM GOALS: Target date: 08/22/2022   Pt will demo L shoulder AROM to be [facility] t/o for performance of ADLs and IADLs. Baseline:  Goal status: INITIAL  2.  Pt will be able to perform ADLs and IADLs without significant pain and difficulty.  Baseline:  Goal status: INITIAL  3.  Pt will be able to perform her normal reaching and overhead activities without significant pain and limitation.   Baseline:  Goal status: INITIAL  4.  Pt will demo at least 4+/5 MMT strength t/o L shoulder for functional lifting/carrying and to assist with returning to recreational activities.  Baseline:  Goal status: INITIAL    PLAN:  PT FREQUENCY: 1-2x/wk x 3 weeks and 2x/wk afterward  PT DURATION: other: 12-16 weeks  PLANNED INTERVENTIONS: Therapeutic exercises, Therapeutic activity, Neuromuscular re-education, Patient/Family education, Self Care, Joint mobilization, Aquatic Therapy, Dry Needling, Electrical stimulation, Spinal mobilization, Cryotherapy, Moist heat, Taping, Ultrasound, Manual therapy, and Re-evaluation  PLAN FOR NEXT SESSION:   Cont per biceps tenodesis protocol.  Op note indicates: She will begin active range of motion at 2 weeks.  At this time she will plan for early and aggressive active range of motion without any rotator cuff restrictions.  ice as needed.    Audie Clear III PT, DPT 06/05/22 10:32 PM

## 2022-06-05 ENCOUNTER — Encounter (HOSPITAL_BASED_OUTPATIENT_CLINIC_OR_DEPARTMENT_OTHER): Payer: Self-pay | Admitting: Physical Therapy

## 2022-06-05 ENCOUNTER — Ambulatory Visit (INDEPENDENT_AMBULATORY_CARE_PROVIDER_SITE_OTHER): Payer: Managed Care, Other (non HMO) | Admitting: Orthopaedic Surgery

## 2022-06-05 DIAGNOSIS — M7582 Other shoulder lesions, left shoulder: Secondary | ICD-10-CM

## 2022-06-05 NOTE — Progress Notes (Signed)
Post Operative Evaluation    Procedure/Date of Surgery: Left shoulder arthroscopy with biceps tenodesis and subacromial decompression 04/30/22  Interval History:   Presents today for follow-up of the above procedure.  She recently finished a course of Mobic which made her feel significantly better.  She is here today for follow-up.  She has been working on active overhead range of motion which is improving.  PMH/PSH/Family History/Social History/Meds/Allergies:    Past Medical History:  Diagnosis Date   Alcohol abuse    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   Hyponatremia 08/15/2020   Macrocytosis 10/29/2020   Portal hypertension (HCC) 08/14/2020   Rotator cuff disorder    Past Surgical History:  Procedure Laterality Date   ANKLE SURGERY     x 2   BICEPT TENODESIS  04/30/2022   Procedure: BICEPS TENODESIS LEFT SHOULDER;  Surgeon: Huel Cote, MD;  Location: [facility];  Service: Orthopedics;;   CHEST TUBE INSERTION Left 06/27/2021   Procedure: CHEST TUBE INSERTION;  Surgeon: Tomma Lightning, MD;  Location: [facility];  Service: Pulmonary;  Laterality: Left;   PARACENTESIS     SHOULDER ARTHROSCOPY WITH SUBACROMIAL DECOMPRESSION Left 04/30/2022   Procedure: LEFT SHOULDER ARTHROSCOPY WITH SUBACROMIAL DECOMPRESSION;  Surgeon: Huel Cote, MD;  Location: [facility];  Service: Orthopedics;  Laterality: Left;   THORACENTESIS N/A 06/18/2021   Procedure: Alanson Puls;  Surgeon: [name], MD;  Location: [facility];  Service: Pulmonary;  Laterality: N/A;   Social History   Socioeconomic History   Marital status: Married    Spouse name: Not on file   Number of children: Not on file   Years of education: Not on file   Highest education level: Not on file  Occupational History   Not on file  Tobacco Use   Smoking status: Former    Packs/day: 0.50    Years: 10.00    Total pack  years: 5.00    Types: Cigarettes   Smokeless tobacco: Never  Vaping Use   Vaping Use: Former  Substance and Sexual Activity   Alcohol use: Not Currently   Drug use: Never   Sexual activity: Yes    Birth control/protection: Post-menopausal  Other Topics Concern   Not on file  Social History Narrative   Not on file   Social Determinants of Health   Financial Resource Strain: Not on file  Food Insecurity: Not on file  Transportation Needs: Not on file  Physical Activity: Not on file  Stress: Not on file  Social Connections: Not on file   Family History  Problem Relation Age of Onset   Pancreatic cancer Paternal Grandfather    Skin cancer Father    Huntington's disease Mother    Allergies  Allergen Reactions   Clemastine Rash and Other (See Comments)    Tavist     Hydrocodone Nausea And Vomiting   Ibuprofen Other (See Comments)    Intestinal reactions    Other Rash and Other (See Comments)    Albertson's Dayhist-D/generic of Tavist- (reaction to antihistamine that is no longer on the market)   Oxycodone Nausea Only and Other (See Comments)    Nausea to opioids     Current Outpatient Medications  Medication Sig Dispense Refill   aspirin EC 325 MG tablet Take 1 tablet (325  mg total) by mouth daily. 30 tablet 0   clindamycin (CLINDAGEL) 1 % gel Apply topically 2 (two) times daily. 30 g 11   clobetasol ointment (TEMOVATE) 0.05 % Apply 1 application topically 2 (two) times daily as needed (psoriasis- bilateral legs).     furosemide (LASIX) 20 MG tablet Take 20 mg by mouth every morning.     lidocaine (XYLOCAINE) 2 % solution Use as directed 5 mLs in the mouth or throat every 3 (three) hours as needed (tooth pain). (Patient not taking: Reported on 05/03/2022)     LORazepam (ATIVAN) 0.5 MG tablet Take by mouth. (Patient not taking: Reported on 05/03/2022)     meloxicam (MOBIC) 15 MG tablet Take 1 tablet (15 mg total) by mouth daily. 7 tablet 0   Potassium Chloride ER 20  MEQ TBCR Take 20 mEq by mouth every morning.     spironolactone (ALDACTONE) 100 MG tablet Take 1 tablet (100 mg total) by mouth daily. 30 tablet 3   triamcinolone cream (KENALOG) 0.1 % Apply 1 application topically daily as needed (psoriasis- bilateral legs).     TYLENOL CHILDRENS CHEWABLES 160 MG CHEW Chew 80 mg by mouth daily as needed (for headaches).     No current facility-administered medications for this visit.   No results found.  Review of Systems:   A ROS was performed including pertinent positives and negatives as documented in the HPI.   Musculoskeletal Exam:    There were no vitals taken for this visit.  Left shoulder incisions are well-healed.  Active forward elevation is to 160 degrees equal to the contralateral side degrees with pain.  External rotation at the side is to 55 degrees.  Internal rotation is deferred today.  2+ radial pulse with distal neurosensory exam intact  Imaging:      I personally reviewed and interpreted the radiographs.   Assessment:   46 year old female who is 6 weeks status post left shoulder biceps tenodesis subacromial decompression overall doing very well.  At this time she will continue to advance through my strengthening portion of the protocol.  All limitations and restrictions were discussed with her.  I will plan to see her back in 6 weeks for reassessment  -Return to clinic in 6 weeks for reassessment    I personally saw and evaluated the patient, and participated in the management and treatment plan.  Huel Cote, MD Attending Physician, Orthopedic Surgery  This document was dictated using Dragon voice recognition software. A reasonable attempt at proof reading has been made to minimize errors.

## 2022-06-06 ENCOUNTER — Encounter (HOSPITAL_BASED_OUTPATIENT_CLINIC_OR_DEPARTMENT_OTHER): Payer: Self-pay | Admitting: Physical Therapy

## 2022-06-06 ENCOUNTER — Ambulatory Visit (HOSPITAL_BASED_OUTPATIENT_CLINIC_OR_DEPARTMENT_OTHER): Payer: Managed Care, Other (non HMO) | Admitting: Physical Therapy

## 2022-06-06 DIAGNOSIS — M6281 Muscle weakness (generalized): Secondary | ICD-10-CM

## 2022-06-06 DIAGNOSIS — M25512 Pain in left shoulder: Secondary | ICD-10-CM | POA: Diagnosis not present

## 2022-06-06 DIAGNOSIS — M25612 Stiffness of left shoulder, not elsewhere classified: Secondary | ICD-10-CM

## 2022-06-06 NOTE — Therapy (Signed)
OUTPATIENT PHYSICAL THERAPY TREATMENT NOTE   Patient Name: Rebecca Bonilla MRN: [id number] DOB:06/27/1976, 46 y.o., female Today's Date: 06/05/2022   PT End of Session - 06/04/22 1601     Visit Number 7    Number of Visits 28    Date for PT Re-Evaluation 07/26/22    Authorization Type [employer]    PT Start Time 1523    PT Stop Time 1557   Pt had to take a call for work.   PT Time Calculation (min) 34 min    Activity Tolerance Patient tolerated treatment well    Behavior During Therapy [facility] for tasks assessed/performed              Past Medical History:  Diagnosis Date   Alcohol abuse    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   Hyponatremia 08/15/2020   Macrocytosis 10/29/2020   Portal hypertension (HCC) 08/14/2020   Rotator cuff disorder    Past Surgical History:  Procedure Laterality Date   ANKLE SURGERY     x 2   BICEPT TENODESIS  04/30/2022   Procedure: BICEPS TENODESIS LEFT SHOULDER;  Surgeon: Huel Cote, MD;  Location: [facility];  Service: Orthopedics;;   CHEST TUBE INSERTION Left 06/27/2021   Procedure: CHEST TUBE INSERTION;  Surgeon: Tomma Lightning, MD;  Location: [facility];  Service: Pulmonary;  Laterality: Left;   PARACENTESIS     SHOULDER ARTHROSCOPY WITH SUBACROMIAL DECOMPRESSION Left 04/30/2022   Procedure: LEFT SHOULDER ARTHROSCOPY WITH SUBACROMIAL DECOMPRESSION;  Surgeon: Huel Cote, MD;  Location:  SUR[facility]ERY CENTER;  Service: Orthopedics;  Laterality: Left;   THORACENTESIS N/A 06/18/2021   Procedure: Alanson Puls;  Surgeon: [name], MD;  Location: [facility];  Service: Pulmonary;  Laterality: N/A;   Patient Active Problem List   Diagnosis Date Noted   Nontraumatic incomplete tear of left rotator cuff    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   Recurrent left pleural effusion 06/26/2021   Flank pain 06/09/2021   Hematuria 06/09/2021   Ascites 04/02/2021   Secondary esophageal varices without  bleeding (HCC) 12/19/2020   History of colon polyps 12/19/2020   SOB (shortness of breath) 10/30/2020   Pleural effusion 10/29/2020   Macrocytosis 10/29/2020   Hypokalemia 10/29/2020   Hypophosphatemia 10/29/2020   Hypomagnesemia 10/29/2020   Leukocytosis 10/12/2020   Thrombocytopenia (HCC) 10/12/2020   Anemia, chronic disease 09/20/2020   Alcoholism (HCC) 08/25/2020   Hyponatremia 08/15/2020   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   Portal hypertension (HCC) 08/14/2020   Alcoholic cirrhosis of liver with ascites (HCC) 08/14/2020   Gallbladder sludge 08/14/2020   Psoriasis 08/09/2020    REFERRING PROVIDER: Huel Cote, MD  REFERRING DIAG: [phone] (ICD-10-CM) - Rotator cuff tendinitis, left  s/p L shoulder arthroscopic biceps tenodesis, debridement, and subacromial decompression  THERAPY DIAG:  Left shoulder pain, unspecified chronicity  Stiffness of left shoulder, not elsewhere classified  Muscle weakness (generalized)  Rationale for Evaluation and Treatment Rehabilitation  ONSET DATE: End of January 2023 ; DOS 04/30/2022  SUBJECTIVE:  SUBJECTIVE STATEMENT: Pt is 5 weeks s/p L shoulder arthroscopic biceps tenodesis, subacromial decompression, and extensive debridement.  Pt has returned to work.  She states things are harder at work, but is doing well overall.  Pt uses a light sling at work.  Pt states she does have some popping at work.  Pt reports improved performance of self care activities including putting her hair up.  Pt reports compliance with HEP.  Pt reports she was sore after prior Rx.  Pt used ice and felt fine the following day.  She occasionally has burning pain and some pin/needles in L shoulder.   PERTINENT HISTORY: L shoulder arthroscopic biceps tenodesis,  subacromial decompression, and extensive debridement on 04/30/2022.  Shoulder AROM to begin at 2 weeks. Cirrhosis of liver   PAIN:  Are you having pain? Yes NPRS:  Current:  0/10, Worst:  5/10, Best:  3/10 Location:  L anterior shoulder  PRECAUTIONS: Other: per surgical protocol.  No AROM for 2 weeks  WEIGHT BEARING RESTRICTIONS: Yes L UE  FALLS:  Has patient fallen in last 6 months? No    OCCUPATION: Pt is an attorney  PLOF: Independent.  Pt was able to perform all of her ADLs/IADLs, reaching activities, overhead activities without limitation or pain.  Pt was able to perform her normal work and lifting activities without limitation or pain.  Pt enjoys swimming  PATIENT GOALS:improve strength, to be able to swim, return to normal activities, to be able to pick up and lower pots and pans  OBJECTIVE:   DIAGNOSTIC FINDINGS:  Pt is post op and had a MRI prior to surgery.     TODAY'S TREATMENT:  11/30  Manual: PROM Into flexion and ER  Grade II and III posterior and interior glides. Trigger point release to upper trap.   wand flexion 2 pounds 2 x 10   side-lying ER 1 pound 2 x 10 Supine ABC 1 set 2 pounds once at 3 pound  Standing flexion to 90 degrees 2 x 10 Scan standing scaption to 90 degrees 2 x 10 Performed in the mid year with cueing not to hike shoulder        Eval: Last visit  Therapeutic Exercise: -Reviewed current function, pain level, response to prior Rx, and HEP compliance.   -Pt received L shoulder PROM in flexion, scaption, ER, and IR per protocol ranges and pt tolerance -Pt performed:   Wand flexion 2x10    Supine shoulder flexion AROM approx 5 reps   Sidelying ER 3x10       PATIENT EDUCATION: Education details:  Educated pt in post op protocol and restrictions, POC, HEP, and exercise form.  Person educated: Patient Education method: Explanation, Demonstration, Tactile cues, Verbal cues, and Handouts Education comprehension: verbalized  understanding, returned demonstration, verbal cues required, tactile cues required, and needs further education  HOME EXERCISE PROGRAM: Access Code: [email or web address] URL: [email or web address] Date: 05/03/2022 Prepared by: Aaron Edelman   ASSESSMENT:  CLINICAL IMPRESSION: Patient is progressing well.  She came in today with minor soreness but felt better after manual therapy.  We added in standing active shoulder flexion and scaption today with no significant increase in pain.  Will continue to advance weight as tolerated.  We attempted to advance  side-lying ER weight but had minor pain so weight kept at 1 pound.  OBJECTIVE IMPAIRMENTS: decreased activity tolerance, decreased ROM, decreased strength, hypomobility, impaired flexibility, impaired UE functional use, and pain.   ACTIVITY LIMITATIONS: carrying, lifting, bathing, dressing, reach over head,  and hygiene/grooming  PARTICIPATION LIMITATIONS: meal prep, cleaning, laundry, driving, shopping, community activity, and occupation  PERSONAL FACTORS: none  REHAB POTENTIAL: Good  CLINICAL DECISION MAKING: Stable/uncomplicated  EVALUATION COMPLEXITY: Low   GOALS:  SHORT TERM GOALS:   Pt will be independent and compliant with HEP for improved pain, ROM, strength, and function.  Baseline: Goal status: INITIAL Target date:  05/31/2022   2.  Pt will tolerate initiation of AAROM without adverse effects for improved shoulder mobility Baseline:  Goal status: INITIAL Target date:  05/16/2022   3.  Pt will demo L shoulder PROM to at least 110 deg in flexion and 35-40 deg in ER for improved ROM and stiffness. Baseline:  Goal status: INITIAL Target date:  05/24/2022     4.  Pt will demo AAROM to at least 120 deg and ER to 45 deg for improved shoulder mobility and function. Baseline:  Goal status: INITIAL Target date:  05/31/2022  5.  Pt will wean out of sling without adverse effects per MD instruction.  Baseline:   Goal status: INITIAL Target date:  05/31/2022  6.  Pt will be able to actively elevate L UE > 90 deg without significant pain. Baseline:  Goal status: INITIAL Target date:  06/07/2022  7.  Pt will be able to perform self care activities with no > than minimal difficulty.   Goal status: INITIAL  Target date:  06/28/2022  8.  Pt will be able to perform her normal work activities without adverse effects.    Goal status: INITIAL  Target date:  06/14/2022    LONG TERM GOALS: Target date: 08/22/2022   Pt will demo L shoulder AROM to be [facility] t/o for performance of ADLs and IADLs. Baseline:  Goal status: INITIAL  2.  Pt will be able to perform ADLs and IADLs without significant pain and difficulty.  Baseline:  Goal status: INITIAL  3.  Pt will be able to perform her normal reaching and overhead activities without significant pain and limitation.   Baseline:  Goal status: INITIAL  4.  Pt will demo at least 4+/5 MMT strength t/o L shoulder for functional lifting/carrying and to assist with returning to recreational activities.  Baseline:  Goal status: INITIAL    PLAN:  PT FREQUENCY: 1-2x/wk x 3 weeks and 2x/wk afterward  PT DURATION: other: 12-16 weeks  PLANNED INTERVENTIONS: Therapeutic exercises, Therapeutic activity, Neuromuscular re-education, Patient/Family education, Self Care, Joint mobilization, Aquatic Therapy, Dry Needling, Electrical stimulation, Spinal mobilization, Cryotherapy, Moist heat, Taping, Ultrasound, Manual therapy, and Re-evaluation  PLAN FOR NEXT SESSION:   Cont per biceps tenodesis protocol.  Op note indicates: She will begin active range of motion at 2 weeks.  At this time she will plan for early and aggressive active range of motion without any rotator cuff restrictions.  ice as needed.    Lorayne Bender PT DPT  06/05/22 10:32 PM

## 2022-06-10 ENCOUNTER — Encounter (HOSPITAL_BASED_OUTPATIENT_CLINIC_OR_DEPARTMENT_OTHER): Payer: Managed Care, Other (non HMO) | Admitting: Orthopaedic Surgery

## 2022-06-10 NOTE — Therapy (Signed)
OUTPATIENT PHYSICAL THERAPY TREATMENT NOTE   Patient Name: Rebecca Bonilla MRN: [id number] DOB:1976-05-30, 46 y.o., female Today's Date: 06/12/2022   PT End of Session - 06/11/22 0847     Visit Number 9    Number of Visits 28    Date for PT Re-Evaluation 07/26/22    Authorization Type [employer]    PT Start Time 0845    PT Stop Time 0928    PT Time Calculation (min) 43 min    Activity Tolerance Patient tolerated treatment well    Behavior During Therapy [facility] for tasks assessed/performed               Past Medical History:  Diagnosis Date   Alcohol abuse    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   Hyponatremia 08/15/2020   Macrocytosis 10/29/2020   Portal hypertension (HCC) 08/14/2020   Rotator cuff disorder    Past Surgical History:  Procedure Laterality Date   ANKLE SURGERY     x 2   BICEPT TENODESIS  04/30/2022   Procedure: BICEPS TENODESIS LEFT SHOULDER;  Surgeon: Huel Cote, MD;  Location: [facility];  Service: Orthopedics;;   CHEST TUBE INSERTION Left 06/27/2021   Procedure: CHEST TUBE INSERTION;  Surgeon: Tomma Lightning, MD;  Location: [facility];  Service: Pulmonary;  Laterality: Left;   PARACENTESIS     SHOULDER ARTHROSCOPY WITH SUBACROMIAL DECOMPRESSION Left 04/30/2022   Procedure: LEFT SHOULDER ARTHROSCOPY WITH SUBACROMIAL DECOMPRESSION;  Surgeon: Huel Cote, MD;  Location: [facility];  Service: Orthopedics;  Laterality: Left;   THORACENTESIS N/A 06/18/2021   Procedure: Alanson Puls;  Surgeon: [name], MD;  Location: [facility];  Service: Pulmonary;  Laterality: N/A;   Patient Active Problem List   Diagnosis Date Noted   Nontraumatic incomplete tear of left rotator cuff    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   Recurrent left pleural effusion 06/26/2021   Flank pain 06/09/2021   Hematuria 06/09/2021   Ascites 04/02/2021   Secondary esophageal varices without bleeding (HCC) 12/19/2020    History of colon polyps 12/19/2020   SOB (shortness of breath) 10/30/2020   Pleural effusion 10/29/2020   Macrocytosis 10/29/2020   Hypokalemia 10/29/2020   Hypophosphatemia 10/29/2020   Hypomagnesemia 10/29/2020   Leukocytosis 10/12/2020   Thrombocytopenia (HCC) 10/12/2020   Anemia, chronic disease 09/20/2020   Alcoholism (HCC) 08/25/2020   Hyponatremia 08/15/2020   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   Portal hypertension (HCC) 08/14/2020   Alcoholic cirrhosis of liver with ascites (HCC) 08/14/2020   Gallbladder sludge 08/14/2020   Psoriasis 08/09/2020    REFERRING PROVIDER: Huel Cote, MD  REFERRING DIAG: [phone] (ICD-10-CM) - Rotator cuff tendinitis, left  s/p L shoulder arthroscopic biceps tenodesis, debridement, and subacromial decompression  THERAPY DIAG:  Left shoulder pain, unspecified chronicity  Stiffness of left shoulder, not elsewhere classified  Muscle weakness (generalized)  Rationale for Evaluation and Treatment Rehabilitation  ONSET DATE: End of January 2023 ; DOS 04/30/2022  SUBJECTIVE:  SUBJECTIVE STATEMENT: Pt is 6 weeks s/p L shoulder arthroscopic biceps tenodesis, subacromial decompression, and extensive debridement.  Pt reports she still gets some popping.  She has a little pain with popping though mainly some soreness.  Pt denies any adverse effects after prior Rx.  Pt reports compliance with HEP.  Pt saw MD last week and was released to begin swimming just no overhead strokes.  Pt has returned to work.  She states things are harder at work, but is doing well overall.  Pt would like to reduce frequency to 1x/wk if that is ok with PT.     PERTINENT HISTORY: L shoulder arthroscopic biceps tenodesis, subacromial decompression, and extensive debridement on  04/30/2022.  Shoulder AROM to begin at 2 weeks. Cirrhosis of liver   PAIN:  Are you having pain? Yes NPRS:  Pt states her pain was 0/10 before her shoulder popped.  Currently 3/10 after her shoulder popped. Location:  L anterior shoulder  PRECAUTIONS: Other: per surgical protocol.  No AROM for 2 weeks  WEIGHT BEARING RESTRICTIONS: Yes L UE  FALLS:  Has patient fallen in last 6 months? No    OCCUPATION: Pt is an attorney  PLOF: Independent.  Pt was able to perform all of her ADLs/IADLs, reaching activities, overhead activities without limitation or pain.  Pt was able to perform her normal work and lifting activities without limitation or pain.  Pt enjoys swimming  PATIENT GOALS:improve strength, to be able to swim, return to normal activities, to be able to pick up and lower pots and pans  OBJECTIVE:   DIAGNOSTIC FINDINGS:  Pt is post op and had a MRI prior to surgery.     TODAY'S TREATMENT:  Manual Therapy:  Grade II and III posterior and interior glides. Trigger point release to upper trap.   Therapeutic Exercise: Pt received L shoulder PROM Into flexion, scaption, and ER  Pt performed: side-lying ER 2 x 10 Supine ABC 1 set with 2 pounds  Standing flexion to 90 degrees 2 x 10 Scan standing scaption to 90 degrees 2 x 10 Performed in front of mirror with cuing not to hike shoulder Standing row to neutral with YTB 2x10   PATIENT EDUCATION: Education details:  PT answered pt's questions.  Educated pt in post op protocol and restrictions, POC, HEP, and exercise form.  Person educated: Patient Education method: Explanation, Demonstration, Tactile cues, Verbal cues Education comprehension: verbalized understanding, returned demonstration, verbal cues required, tactile cues required, and needs further education  HOME EXERCISE PROGRAM: Access Code: [email or web address] URL: [email or web address] Date: 05/03/2022 Prepared by: Ronny Flurry   ASSESSMENT:  CLINICAL IMPRESSION: Patient is progressing well with ROM, function, and tolerance to activity.  Pt is improving with ROM t/o L shoulder including ER.  She does have some limitations and tightness in flexion which improved with increased reps of PROM.  PT has been progressing with standing elevation exercises.  Pt performed standing flexion well and was challenged more with standing scaption.  PT provided verbal and tactile cuing for correct form and pt performed in front of the mirror for visual cuing with standing elevation exercises.  She responded well to Rx having no increased pain after Rx.  Pt should continue to benefit from cont skilled PT services per protocol to address ongoing goals and to restore desired level of function.  OBJECTIVE IMPAIRMENTS: decreased activity tolerance, decreased ROM, decreased strength, hypomobility, impaired flexibility, impaired UE functional use, and pain.   ACTIVITY LIMITATIONS: carrying, lifting, bathing, dressing, reach  over head, and hygiene/grooming  PARTICIPATION LIMITATIONS: meal prep, cleaning, laundry, driving, shopping, community activity, and occupation  PERSONAL FACTORS: none  REHAB POTENTIAL: Good  CLINICAL DECISION MAKING: Stable/uncomplicated  EVALUATION COMPLEXITY: Low   GOALS:  SHORT TERM GOALS:   Pt will be independent and compliant with HEP for improved pain, ROM, strength, and function.  Baseline: Goal status: INITIAL Target date:  05/31/2022   2.  Pt will tolerate initiation of AAROM without adverse effects for improved shoulder mobility Baseline:  Goal status: INITIAL Target date:  05/16/2022   3.  Pt will demo L shoulder PROM to at least 110 deg in flexion and 35-40 deg in ER for improved ROM and stiffness. Baseline:  Goal status: INITIAL Target date:  05/24/2022     4.  Pt will demo AAROM to at least 120 deg and ER to 45 deg for improved shoulder mobility and function. Baseline:  Goal  status: INITIAL Target date:  05/31/2022  5.  Pt will wean out of sling without adverse effects per MD instruction.  Baseline:  Goal status: INITIAL Target date:  05/31/2022  6.  Pt will be able to actively elevate L UE > 90 deg without significant pain. Baseline:  Goal status: INITIAL Target date:  06/07/2022  7.  Pt will be able to perform self care activities with no > than minimal difficulty.   Goal status: INITIAL  Target date:  06/28/2022  8.  Pt will be able to perform her normal work activities without adverse effects.    Goal status: INITIAL  Target date:  06/14/2022    LONG TERM GOALS: Target date: 08/22/2022   Pt will demo L shoulder AROM to be [employer] t/o for performance of ADLs and IADLs. Baseline:  Goal status: INITIAL  2.  Pt will be able to perform ADLs and IADLs without significant pain and difficulty.  Baseline:  Goal status: INITIAL  3.  Pt will be able to perform her normal reaching and overhead activities without significant pain and limitation.   Baseline:  Goal status: INITIAL  4.  Pt will demo at least 4+/5 MMT strength t/o L shoulder for functional lifting/carrying and to assist with returning to recreational activities.  Baseline:  Goal status: INITIAL    PLAN:  PT FREQUENCY: 1-2x/wk x 3 weeks and 2x/wk afterward  PT DURATION: other: 12-16 weeks  PLANNED INTERVENTIONS: Therapeutic exercises, Therapeutic activity, Neuromuscular re-education, Patient/Family education, Self Care, Joint mobilization, Aquatic Therapy, Dry Needling, Electrical stimulation, Spinal mobilization, Cryotherapy, Moist heat, Taping, Ultrasound, Manual therapy, and Re-evaluation  PLAN FOR NEXT SESSION:   Cont per biceps tenodesis protocol.  Op note indicates: She will begin active range of motion at 2 weeks.  At this time she will plan for early and aggressive active range of motion without any rotator cuff restrictions.  ice as needed.  PN next visit.    Selinda Michaels III  PT, DPT 06/12/22 1:54 PM

## 2022-06-11 ENCOUNTER — Encounter (HOSPITAL_BASED_OUTPATIENT_CLINIC_OR_DEPARTMENT_OTHER): Payer: Self-pay | Admitting: Physical Therapy

## 2022-06-11 ENCOUNTER — Ambulatory Visit (HOSPITAL_BASED_OUTPATIENT_CLINIC_OR_DEPARTMENT_OTHER): Payer: Managed Care, Other (non HMO) | Attending: Orthopaedic Surgery | Admitting: Physical Therapy

## 2022-06-11 DIAGNOSIS — M25612 Stiffness of left shoulder, not elsewhere classified: Secondary | ICD-10-CM | POA: Diagnosis present

## 2022-06-11 DIAGNOSIS — M25512 Pain in left shoulder: Secondary | ICD-10-CM | POA: Insufficient documentation

## 2022-06-11 DIAGNOSIS — M6281 Muscle weakness (generalized): Secondary | ICD-10-CM | POA: Diagnosis present

## 2022-06-13 ENCOUNTER — Encounter (HOSPITAL_BASED_OUTPATIENT_CLINIC_OR_DEPARTMENT_OTHER): Payer: Self-pay

## 2022-06-13 ENCOUNTER — Ambulatory Visit (HOSPITAL_BASED_OUTPATIENT_CLINIC_OR_DEPARTMENT_OTHER): Payer: Managed Care, Other (non HMO) | Admitting: Physical Therapy

## 2022-06-18 ENCOUNTER — Encounter (HOSPITAL_BASED_OUTPATIENT_CLINIC_OR_DEPARTMENT_OTHER): Payer: Self-pay | Admitting: Physical Therapy

## 2022-06-18 ENCOUNTER — Ambulatory Visit (HOSPITAL_BASED_OUTPATIENT_CLINIC_OR_DEPARTMENT_OTHER): Payer: Managed Care, Other (non HMO) | Admitting: Physical Therapy

## 2022-06-18 DIAGNOSIS — M25612 Stiffness of left shoulder, not elsewhere classified: Secondary | ICD-10-CM

## 2022-06-18 DIAGNOSIS — M6281 Muscle weakness (generalized): Secondary | ICD-10-CM

## 2022-06-18 DIAGNOSIS — M25512 Pain in left shoulder: Secondary | ICD-10-CM

## 2022-06-18 NOTE — Therapy (Signed)
OUTPATIENT PHYSICAL THERAPY TREATMENT NOTE   Patient Name: Rebecca Bonilla MRN: [id number] DOB:01/31/1976, 46 y.o., female Today's Date: 06/12/2022   PT End of Session - 06/11/22 0847     Visit Number 9    Number of Visits 28    Date for PT Re-Evaluation 07/26/22    Authorization Type [employer]    PT Start Time 0845    PT Stop Time 0928    PT Time Calculation (min) 43 min    Activity Tolerance Patient tolerated treatment well    Behavior During Therapy [facility] for tasks assessed/performed               Past Medical History:  Diagnosis Date   Alcohol abuse    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   Hyponatremia 08/15/2020   Macrocytosis 10/29/2020   Portal hypertension (HCC) 08/14/2020   Rotator cuff disorder    Past Surgical History:  Procedure Laterality Date   ANKLE SURGERY     x 2   BICEPT TENODESIS  04/30/2022   Procedure: BICEPS TENODESIS LEFT SHOULDER;  Surgeon: Huel Cote, MD;  Location: [facility];  Service: Orthopedics;;   CHEST TUBE INSERTION Left 06/27/2021   Procedure: CHEST TUBE INSERTION;  Surgeon: Tomma Lightning, MD;  Location: [facility];  Service: Pulmonary;  Laterality: Left;   PARACENTESIS     SHOULDER ARTHROSCOPY WITH SUBACROMIAL DECOMPRESSION Left 04/30/2022   Procedure: LEFT SHOULDER ARTHROSCOPY WITH SUBACROMIAL DECOMPRESSION;  Surgeon: Huel Cote, MD;  Location: [facility];  Service: Orthopedics;  Laterality: Left;   THORACENTESIS N/A 06/18/2021   Procedure: Alanson Puls;  Surgeon: [name], MD;  Location: 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   Recurrent left pleural effusion 06/26/2021   Flank pain 06/09/2021   Hematuria 06/09/2021   Ascites 04/02/2021   Secondary esophageal varices without bleeding (HCC) 12/19/2020    History of colon polyps 12/19/2020   SOB (shortness of breath) 10/30/2020   Pleural effusion 10/29/2020   Macrocytosis 10/29/2020   Hypokalemia 10/29/2020   Hypophosphatemia 10/29/2020   Hypomagnesemia 10/29/2020   Leukocytosis 10/12/2020   Thrombocytopenia (HCC) 10/12/2020   Anemia, chronic disease 09/20/2020   Alcoholism (HCC) 08/25/2020   Hyponatremia 08/15/2020   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   Portal hypertension (HCC) 08/14/2020   Alcoholic cirrhosis of liver with ascites (HCC) 08/14/2020   Gallbladder sludge 08/14/2020   Psoriasis 08/09/2020    REFERRING PROVIDER: Huel Cote, MD  REFERRING DIAG: [id number] (ICD-10-CM) - Rotator cuff tendinitis, left  s/p L shoulder arthroscopic biceps tenodesis, debridement, and subacromial decompression  THERAPY DIAG:  Left shoulder pain, unspecified chronicity  Stiffness of left shoulder, not elsewhere classified  Muscle weakness (generalized)  Rationale for Evaluation and Treatment Rehabilitation  ONSET DATE: End of January 2023 ; DOS 04/30/2022  SUBJECTIVE:  SUBJECTIVE STATEMENT: The patient reports she was a little sore when she woke up this morning but she is otherwise doing well.  PERTINENT HISTORY: L shoulder arthroscopic biceps tenodesis, subacromial decompression, and extensive debridement on 04/30/2022.  Shoulder AROM to begin at 2 weeks. Cirrhosis of liver   PAIN:  Are you having pain? Yes NPRS:  Pt states her pain was 0/10 before her shoulder popped.  Currently 3/10 after her shoulder popped. Location:  L anterior shoulder  PRECAUTIONS: Other: per surgical protocol.  No AROM for 2 weeks  WEIGHT BEARING RESTRICTIONS: Yes L UE  FALLS:  Has patient fallen in last 6 months? No    OCCUPATION: Pt is an  attorney  PLOF: Independent.  Pt was able to perform all of her ADLs/IADLs, reaching activities, overhead activities without limitation or pain.  Pt was able to perform her normal work and lifting activities without limitation or pain.  Pt enjoys swimming  PATIENT GOALS:improve strength, to be able to swim, return to normal activities, to be able to pick up and lower pots and pans  OBJECTIVE:   DIAGNOSTIC FINDINGS:  Pt is post op and had a MRI prior to surgery.     TODAY'S TREATMENT:   12/12 Manual Therapy:  Grade II and III posterior and interior glides. Trigger point release to upper trap.   side-lying ER 3x10 2lbs  Supine ABC 1 set with 2 pounds  Standing flexion to 90 degrees 2 x 10 Scan standing scaption to 90 degrees 2 x 10 Performed in front of mirror with cuing not to hike shoulder  Last visit  Manual Therapy:  Grade II and III posterior and interior glides. Trigger point release to upper trap.   Therapeutic Exercise: Pt received L shoulder PROM Into flexion, scaption, and ER  Pt performed: side-lying ER 2 x 10 Supine ABC 1 set with 2 pounds  Standing flexion to 90 degrees 2 x 10 Scan standing scaption to 90 degrees 2 x 10 Performed in front of mirror with cuing not to hike shoulder Standing row to neutral with YTB 2x10   PATIENT EDUCATION: Education details:  PT answered pt's questions.  Educated pt in post op protocol and restrictions, POC, HEP, and exercise form.  Person educated: Patient Education method: Explanation, Demonstration, Tactile cues, Verbal cues Education comprehension: verbalized understanding, returned demonstration, verbal cues required, tactile cues required, and needs further education  HOME EXERCISE PROGRAM: Access Code: [email or web address] URL: [email or web address] Date: 05/03/2022 Prepared by: Ronny Flurry   ASSESSMENT:  CLINICAL IMPRESSION: Patient continues to make good progress.  She tolerated there-ex well.  Will  continue to advance her weights.  We increased the weight of her wand flexion to 2 pounds.  She continues to have mild trigger point in her upper trap.  It appears to be resolving and she gets stronger.  We could always talk about trigger point dry needling if that trigger point remains.  Therapy will continue to progress as tolerated.  OBJECTIVE IMPAIRMENTS: decreased activity tolerance, decreased ROM, decreased strength, hypomobility, impaired flexibility, impaired UE functional use, and pain.   ACTIVITY LIMITATIONS: carrying, lifting, bathing, dressing, reach over head, and hygiene/grooming  PARTICIPATION LIMITATIONS: meal prep, cleaning, laundry, driving, shopping, community activity, and occupation  PERSONAL FACTORS: none  REHAB POTENTIAL: Good  CLINICAL DECISION MAKING: Stable/uncomplicated  EVALUATION COMPLEXITY: Low   GOALS:  SHORT TERM GOALS:   Pt will be independent and compliant with HEP for improved pain, ROM, strength, and function.  Baseline: Goal status: INITIAL Target  date:  05/31/2022   2.  Pt will tolerate initiation of AAROM without adverse effects for improved shoulder mobility Baseline:  Goal status: INITIAL Target date:  05/16/2022   3.  Pt will demo L shoulder PROM to at least 110 deg in flexion and 35-40 deg in ER for improved ROM and stiffness. Baseline:  Goal status: INITIAL Target date:  05/24/2022     4.  Pt will demo AAROM to at least 120 deg and ER to 45 deg for improved shoulder mobility and function. Baseline:  Goal status: INITIAL Target date:  05/31/2022  5.  Pt will wean out of sling without adverse effects per MD instruction.  Baseline:  Goal status: INITIAL Target date:  05/31/2022  6.  Pt will be able to actively elevate L UE > 90 deg without significant pain. Baseline:  Goal status: INITIAL Target date:  06/07/2022  7.  Pt will be able to perform self care activities with no > than minimal difficulty.   Goal status:  INITIAL  Target date:  06/28/2022  8.  Pt will be able to perform her normal work activities without adverse effects.    Goal status: INITIAL  Target date:  06/14/2022    LONG TERM GOALS: Target date: 08/22/2022   Pt will demo L shoulder AROM to be [facility] t/o for performance of ADLs and IADLs. Baseline:  Goal status: INITIAL  2.  Pt will be able to perform ADLs and IADLs without significant pain and difficulty.  Baseline:  Goal status: INITIAL  3.  Pt will be able to perform her normal reaching and overhead activities without significant pain and limitation.   Baseline:  Goal status: INITIAL  4.  Pt will demo at least 4+/5 MMT strength t/o L shoulder for functional lifting/carrying and to assist with returning to recreational activities.  Baseline:  Goal status: INITIAL    PLAN:  PT FREQUENCY: 1-2x/wk x 3 weeks and 2x/wk afterward  PT DURATION: other: 12-16 weeks  PLANNED INTERVENTIONS: Therapeutic exercises, Therapeutic activity, Neuromuscular re-education, Patient/Family education, Self Care, Joint mobilization, Aquatic Therapy, Dry Needling, Electrical stimulation, Spinal mobilization, Cryotherapy, Moist heat, Taping, Ultrasound, Manual therapy, and Re-evaluation  PLAN FOR NEXT SESSION:   Cont per biceps tenodesis protocol.  Op note indicates: She will begin active range of motion at 2 weeks.  At this time she will plan for early and aggressive active range of motion without any rotator cuff restrictions.  ice as needed.  PN next visit.    Carolyne Littles PT DPT  06/12/22 1:54 PM

## 2022-06-20 ENCOUNTER — Encounter (HOSPITAL_BASED_OUTPATIENT_CLINIC_OR_DEPARTMENT_OTHER): Payer: Managed Care, Other (non HMO) | Admitting: Physical Therapy

## 2022-06-25 ENCOUNTER — Encounter (HOSPITAL_BASED_OUTPATIENT_CLINIC_OR_DEPARTMENT_OTHER): Payer: Self-pay | Admitting: Physical Therapy

## 2022-06-25 ENCOUNTER — Ambulatory Visit (HOSPITAL_BASED_OUTPATIENT_CLINIC_OR_DEPARTMENT_OTHER): Payer: Managed Care, Other (non HMO) | Admitting: Physical Therapy

## 2022-06-25 DIAGNOSIS — M25612 Stiffness of left shoulder, not elsewhere classified: Secondary | ICD-10-CM

## 2022-06-25 DIAGNOSIS — M6281 Muscle weakness (generalized): Secondary | ICD-10-CM

## 2022-06-25 DIAGNOSIS — M25512 Pain in left shoulder: Secondary | ICD-10-CM

## 2022-06-25 NOTE — Therapy (Signed)
OUTPATIENT PHYSICAL THERAPY TREATMENT NOTE   Patient Name: Rebecca Bonilla MRN: [id number] DOB:1976-05-30, 46 y.o., female Today's Date: 06/12/2022   PT End of Session - 06/11/22 0847     Visit Number 9    Number of Visits 28    Date for PT Re-Evaluation 07/26/22    Authorization Type [employer]    PT Start Time 0845    PT Stop Time 0928    PT Time Calculation (min) 43 min    Activity Tolerance Patient tolerated treatment well    Behavior During Therapy [facility] for tasks assessed/performed               Past Medical History:  Diagnosis Date   Alcohol abuse    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   Hyponatremia 08/15/2020   Macrocytosis 10/29/2020   Portal hypertension (HCC) 08/14/2020   Rotator cuff disorder    Past Surgical History:  Procedure Laterality Date   ANKLE SURGERY     x 2   BICEPT TENODESIS  04/30/2022   Procedure: BICEPS TENODESIS LEFT SHOULDER;  Surgeon: Huel Cote, MD;  Location: [facility];  Service: Orthopedics;;   CHEST TUBE INSERTION Left 06/27/2021   Procedure: CHEST TUBE INSERTION;  Surgeon: Tomma Lightning, MD;  Location: [facility];  Service: Pulmonary;  Laterality: Left;   PARACENTESIS     SHOULDER ARTHROSCOPY WITH SUBACROMIAL DECOMPRESSION Left 04/30/2022   Procedure: LEFT SHOULDER ARTHROSCOPY WITH SUBACROMIAL DECOMPRESSION;  Surgeon: Huel Cote, MD;  Location: [facility];  Service: Orthopedics;  Laterality: Left;   THORACENTESIS N/A 06/18/2021   Procedure: Alanson Puls;  Surgeon: [name], MD;  Location: [facility];  Service: Pulmonary;  Laterality: N/A;   Patient Active Problem List   Diagnosis Date Noted   Nontraumatic incomplete tear of left rotator cuff    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   Recurrent left pleural effusion 06/26/2021   Flank pain 06/09/2021   Hematuria 06/09/2021   Ascites 04/02/2021   Secondary esophageal varices without bleeding (HCC) 12/19/2020    History of colon polyps 12/19/2020   SOB (shortness of breath) 10/30/2020   Pleural effusion 10/29/2020   Macrocytosis 10/29/2020   Hypokalemia 10/29/2020   Hypophosphatemia 10/29/2020   Hypomagnesemia 10/29/2020   Leukocytosis 10/12/2020   Thrombocytopenia (HCC) 10/12/2020   Anemia, chronic disease 09/20/2020   Alcoholism (HCC) 08/25/2020   Hyponatremia 08/15/2020   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   Portal hypertension (HCC) 08/14/2020   Alcoholic cirrhosis of liver with ascites (HCC) 08/14/2020   Gallbladder sludge 08/14/2020   Psoriasis 08/09/2020    REFERRING PROVIDER: Huel Cote, MD  REFERRING DIAG: [phone] (ICD-10-CM) - Rotator cuff tendinitis, left  s/p L shoulder arthroscopic biceps tenodesis, debridement, and subacromial decompression  THERAPY DIAG:  Left shoulder pain, unspecified chronicity  Stiffness of left shoulder, not elsewhere classified  Muscle weakness (generalized)  Rationale for Evaluation and Treatment Rehabilitation  ONSET DATE: End of January 2023 ; DOS 04/30/2022  SUBJECTIVE:  SUBJECTIVE STATEMENT: The patient reports she is doing well. She is having very little pain.  PERTINENT HISTORY: L shoulder arthroscopic biceps tenodesis, subacromial decompression, and extensive debridement on 04/30/2022.  Shoulder AROM to begin at 2 weeks. Cirrhosis of liver   PAIN:  Are you having pain? Yes NPRS:  Pt states her pain was 0/10 before her shoulder popped.  Currently 3/10 after her shoulder popped. Location:  L anterior shoulder  PRECAUTIONS: Other: per surgical protocol.  No AROM for 2 weeks  WEIGHT BEARING RESTRICTIONS: Yes L UE  FALLS:  Has patient fallen in last 6 months? No    OCCUPATION: Pt is an attorney  PLOF: Independent.  Pt was able to  perform all of her ADLs/IADLs, reaching activities, overhead activities without limitation or pain.  Pt was able to perform her normal work and lifting activities without limitation or pain.  Pt enjoys swimming  PATIENT GOALS:improve strength, to be able to swim, return to normal activities, to be able to pick up and lower pots and pans  OBJECTIVE:   DIAGNOSTIC FINDINGS:  Pt is post op and had a MRI prior to surgery.     TODAY'S TREATMENT:  12/19 Manual Therapy:  Grade II and III posterior and interior glides. Trigger point release to upper trap.    side-lying ER 3x10 2lbs  Supine ABC 1 set with 2 pounds  Standing flexion to 90 degrees 2 x 10 1lb  Scan standing scaption to 90 degrees 2 x 10 1lb  Performed in front of mirror with cuing not to hike shoulder  Scap retraction 2x15 red  Shoulder extension 2x15 red   Row machine 2x15 10 lbs  Cable extension 10 lbs 2x15  Cable row x10 10 lbs     12/12 Manual Therapy:  Grade II and III posterior and interior glides. Trigger point release to upper trap.   side-lying ER 3x10 2lbs  Supine ABC 1 set with 2 pounds  Standing flexion to 90 degrees 2 x 10 Scan standing scaption to 90 degrees 2 x 10 Performed in front of mirror with cuing not to hike shoulder  Last visit  Manual Therapy:  Grade II and III posterior and interior glides. Trigger point release to upper trap.   Therapeutic Exercise: Pt received L shoulder PROM Into flexion, scaption, and ER  Pt performed: side-lying ER 2 x 10 Supine ABC 1 set with 2 pounds  Standing flexion to 90 degrees 2 x 10 Scan standing scaption to 90 degrees 2 x 10 Performed in front of mirror with cuing not to hike shoulder Standing row to neutral with YTB 2x10   PATIENT EDUCATION: Education details:  PT answered pt's questions.  Educated pt in post op protocol and restrictions, POC, HEP, and exercise form.  Person educated: Patient Education method: Explanation, Demonstration,  Tactile cues, Verbal cues Education comprehension: verbalized understanding, returned demonstration, verbal cues required, tactile cues required, and needs further education  HOME EXERCISE PROGRAM: Access Code: [email or web address] URL: [email or web address] Date: 05/03/2022 Prepared by: Aaron Edelman   ASSESSMENT:  CLINICAL IMPRESSION: The patient is making great progress. She had full motion today. She is tolerating strengthening well. We did low weight gy exercises today. We will review again next week. At that time if she continues to do this well we will follow up a month after ad potentially D/C   OBJECTIVE IMPAIRMENTS: decreased activity tolerance, decreased ROM, decreased strength, hypomobility, impaired flexibility, impaired UE functional use, and pain.   ACTIVITY LIMITATIONS: carrying, lifting, bathing,  dressing, reach over head, and hygiene/grooming  PARTICIPATION LIMITATIONS: meal prep, cleaning, laundry, driving, shopping, community activity, and occupation  PERSONAL FACTORS: none  REHAB POTENTIAL: Good  CLINICAL DECISION MAKING: Stable/uncomplicated  EVALUATION COMPLEXITY: Low   GOALS:  SHORT TERM GOALS:   Pt will be independent and compliant with HEP for improved pain, ROM, strength, and function.  Baseline: Goal status: INITIAL Target date:  05/31/2022   2.  Pt will tolerate initiation of AAROM without adverse effects for improved shoulder mobility Baseline:  Goal status: INITIAL Target date:  05/16/2022   3.  Pt will demo L shoulder PROM to at least 110 deg in flexion and 35-40 deg in ER for improved ROM and stiffness. Baseline:  Goal status: INITIAL Target date:  05/24/2022     4.  Pt will demo AAROM to at least 120 deg and ER to 45 deg for improved shoulder mobility and function. Baseline:  Goal status: INITIAL Target date:  05/31/2022  5.  Pt will wean out of sling without adverse effects per MD instruction.  Baseline:  Goal status:  INITIAL Target date:  05/31/2022  6.  Pt will be able to actively elevate L UE > 90 deg without significant pain. Baseline:  Goal status: INITIAL Target date:  06/07/2022  7.  Pt will be able to perform self care activities with no > than minimal difficulty.   Goal status: INITIAL  Target date:  06/28/2022  8.  Pt will be able to perform her normal work activities without adverse effects.    Goal status: INITIAL  Target date:  06/14/2022    LONG TERM GOALS: Target date: 08/22/2022   Pt will demo L shoulder AROM to be [facility] t/o for performance of ADLs and IADLs. Baseline:  Goal status: INITIAL  2.  Pt will be able to perform ADLs and IADLs without significant pain and difficulty.  Baseline:  Goal status: INITIAL  3.  Pt will be able to perform her normal reaching and overhead activities without significant pain and limitation.   Baseline:  Goal status: INITIAL  4.  Pt will demo at least 4+/5 MMT strength t/o L shoulder for functional lifting/carrying and to assist with returning to recreational activities.  Baseline:  Goal status: INITIAL    PLAN:  PT FREQUENCY: 1-2x/wk x 3 weeks and 2x/wk afterward  PT DURATION: other: 12-16 weeks  PLANNED INTERVENTIONS: Therapeutic exercises, Therapeutic activity, Neuromuscular re-education, Patient/Family education, Self Care, Joint mobilization, Aquatic Therapy, Dry Needling, Electrical stimulation, Spinal mobilization, Cryotherapy, Moist heat, Taping, Ultrasound, Manual therapy, and Re-evaluation  PLAN FOR NEXT SESSION:   Cont per biceps tenodesis protocol.  Op note indicates: She will begin active range of motion at 2 weeks.  At this time she will plan for early and aggressive active range of motion without any rotator cuff restrictions.  ice as needed.  PN next visit.    Lorayne Bender PT DPT  06/12/22 1:54 PM

## 2022-06-27 ENCOUNTER — Encounter (HOSPITAL_BASED_OUTPATIENT_CLINIC_OR_DEPARTMENT_OTHER): Payer: Managed Care, Other (non HMO) | Admitting: Physical Therapy

## 2022-07-02 ENCOUNTER — Ambulatory Visit (HOSPITAL_BASED_OUTPATIENT_CLINIC_OR_DEPARTMENT_OTHER): Payer: Managed Care, Other (non HMO) | Admitting: Physical Therapy

## 2022-07-02 ENCOUNTER — Encounter (HOSPITAL_BASED_OUTPATIENT_CLINIC_OR_DEPARTMENT_OTHER): Payer: Self-pay | Admitting: Physical Therapy

## 2022-07-02 DIAGNOSIS — M25612 Stiffness of left shoulder, not elsewhere classified: Secondary | ICD-10-CM

## 2022-07-02 DIAGNOSIS — M6281 Muscle weakness (generalized): Secondary | ICD-10-CM

## 2022-07-02 DIAGNOSIS — M25512 Pain in left shoulder: Secondary | ICD-10-CM | POA: Diagnosis not present

## 2022-07-02 NOTE — Therapy (Signed)
OUTPATIENT PHYSICAL THERAPY TREATMENT NOTE   Patient Name: Rebecca Bonilla MRN: [id number] DOB:17-Aug-1975, 46 y.o., female Today's Date: 07/02/2022   PT End of Session - 07/02/22 0936     Visit Number 12    Number of Visits 28    Date for PT Re-Evaluation 07/26/22    Authorization Type [employer]    PT Start Time 0930    PT Stop Time 1010    PT Time Calculation (min) 40 min    Activity Tolerance Patient tolerated treatment well    Behavior During Therapy [facility] for tasks assessed/performed               Past Medical History:  Diagnosis Date   Alcohol abuse    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   Hyponatremia 08/15/2020   Macrocytosis 10/29/2020   Portal hypertension (HCC) 08/14/2020   Rotator cuff disorder    Past Surgical History:  Procedure Laterality Date   ANKLE SURGERY     x 2   BICEPT TENODESIS  04/30/2022   Procedure: BICEPS TENODESIS LEFT SHOULDER;  Surgeon: Huel Cote, MD;  Location: [facility];  Service: Orthopedics;;   CHEST TUBE INSERTION Left 06/27/2021   Procedure: CHEST TUBE INSERTION;  Surgeon: Tomma Lightning, MD;  Location: [facility];  Service: Pulmonary;  Laterality: Left;   PARACENTESIS     SHOULDER ARTHROSCOPY WITH SUBACROMIAL DECOMPRESSION Left 04/30/2022   Procedure: LEFT SHOULDER ARTHROSCOPY WITH SUBACROMIAL DECOMPRESSION;  Surgeon: Huel Cote, MD;  Location: [facility];  Service: Orthopedics;  Laterality: Left;   THORACENTESIS N/A 06/18/2021   Procedure: Alanson Puls;  Surgeon: [name], MD;  Location: [facility];  Service: Pulmonary;  Laterality: N/A;   Patient Active Problem List   Diagnosis Date Noted   Nontraumatic incomplete tear of left rotator cuff    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   Recurrent left pleural effusion 06/26/2021   Flank pain 06/09/2021   Hematuria 06/09/2021   Ascites 04/02/2021   Secondary esophageal varices without bleeding (HCC) 12/19/2020    History of colon polyps 12/19/2020   SOB (shortness of breath) 10/30/2020   Pleural effusion 10/29/2020   Macrocytosis 10/29/2020   Hypokalemia 10/29/2020   Hypophosphatemia 10/29/2020   Hypomagnesemia 10/29/2020   Leukocytosis 10/12/2020   Thrombocytopenia (HCC) 10/12/2020   Anemia, chronic disease 09/20/2020   Alcoholism (HCC) 08/25/2020   Hyponatremia 08/15/2020   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   Portal hypertension (HCC) 08/14/2020   Alcoholic cirrhosis of liver with ascites (HCC) 08/14/2020   Gallbladder sludge 08/14/2020   Psoriasis 08/09/2020    REFERRING PROVIDER: Huel Cote, MD  REFERRING DIAG: [phone] (ICD-10-CM) - Rotator cuff tendinitis, left  s/p L shoulder arthroscopic biceps tenodesis, debridement, and subacromial decompression  THERAPY DIAG:  Left shoulder pain, unspecified chronicity  Stiffness of left shoulder, not elsewhere classified  Muscle weakness (generalized)  Rationale for Evaluation and Treatment Rehabilitation  ONSET DATE: End of January 2023 ; DOS 04/30/2022  SUBJECTIVE:  SUBJECTIVE STATEMENT: The patient reports she is doing really well. She only has pain when she sleeps on it.  PERTINENT HISTORY: L shoulder arthroscopic biceps tenodesis, subacromial decompression, and extensive debridement on 04/30/2022.  Shoulder AROM to begin at 2 weeks. Cirrhosis of liver   PAIN:  Are you having pain? Yes NPRS:  Pt states her pain was 0/10 before her shoulder popped.  Currently 3/10 after her shoulder popped. Location:  L anterior shoulder  PRECAUTIONS: Other: per surgical protocol.  No AROM for 2 weeks  WEIGHT BEARING RESTRICTIONS: Yes L UE  FALLS:  Has patient fallen in last 6 months? No    OCCUPATION: Pt is an attorney  PLOF: Independent.   Pt was able to perform all of her ADLs/IADLs, reaching activities, overhead activities without limitation or pain.  Pt was able to perform her normal work and lifting activities without limitation or pain.  Pt enjoys swimming  PATIENT GOALS:improve strength, to be able to swim, return to normal activities, to be able to pick up and lower pots and pans  OBJECTIVE:   DIAGNOSTIC FINDINGS:  Pt is post op and had a MRI prior to surgery.     TODAY'S TREATMENT:  12/26 Reviewed FOTO and ROM  Overhead flexion 2x15 1lbs with education on how to use.  Supine ABC 2 lbs   UBE 1.5 fwd and back  Triceps press down 2x15 15 lbs   Lat pull down 2x15 25 lbs  Biceps machine lifestyles x10 10 lbs. Could feel it. Patient advised this is not the most important machine. Free weights may be better for this exercise.   Reviewed all upper body exercises.   12/19 Manual Therapy:  Grade II and III posterior and interior glides. Trigger point release to upper trap.    side-lying ER 3x10 2lbs  Supine ABC 1 set with 2 pounds  Standing flexion to 90 degrees 2 x 10 1lb  Scan standing scaption to 90 degrees 2 x 10 1lb  Performed in front of mirror with cuing not to hike shoulder  Scap retraction 2x15 red  Shoulder extension 2x15 red   Row machine 2x15 10 lbs cybex Cable extension 10 lbs 2x15  Cable row x10 10 lbs     12/12 Manual Therapy:  Grade II and III posterior and interior glides. Trigger point release to upper trap.   side-lying ER 3x10 2lbs  Supine ABC 1 set with 2 pounds  Standing flexion to 90 degrees 2 x 10 Scan standing scaption to 90 degrees 2 x 10 Performed in front of mirror with cuing not to hike shoulder  Last visit  Manual Therapy:  Grade II and III posterior and interior glides. Trigger point release to upper trap.   Therapeutic Exercise: Pt received L shoulder PROM Into flexion, scaption, and ER  Pt performed: side-lying ER 2 x 10 Supine ABC 1 set with 2  pounds  Standing flexion to 90 degrees 2 x 10 Scan standing scaption to 90 degrees 2 x 10 Performed in front of mirror with cuing not to hike shoulder Standing row to neutral with YTB 2x10   PATIENT EDUCATION: Education details:  PT answered pt's questions.  Educated pt in post op protocol and restrictions, POC, HEP, and exercise form.  Person educated: Patient Education method: Explanation, Demonstration, Tactile cues, Verbal cues Education comprehension: verbalized understanding, returned demonstration, verbal cues required, tactile cues required, and needs further education  HOME EXERCISE PROGRAM: Access Code: [email or web address] URL: [email or web address] Date: 05/03/2022 Prepared by: Thurston Poundsrey  Romeo Apple   ASSESSMENT:  CLINICAL IMPRESSION: The patient is progressing very well. She is having very little pain at this time. We reviewed different exercises that she can do at the gym. She did well with most of them. She had a little discomfort with biceps flexion. She was advised to stay with free weights if she wants to do that exercise. She was shown the UBE as well. Therapy will keep her case open in case she needs a follow up but otherwise we will D/C to HEP. See below for goal specific progress OBJECTIVE IMPAIRMENTS: decreased activity tolerance, decreased ROM, decreased strength, hypomobility, impaired flexibility, impaired UE functional use, and pain.   ACTIVITY LIMITATIONS: carrying, lifting, bathing, dressing, reach over head, and hygiene/grooming  PARTICIPATION LIMITATIONS: meal prep, cleaning, laundry, driving, shopping, community activity, and occupation  PERSONAL FACTORS: none  REHAB POTENTIAL: Good  CLINICAL DECISION MAKING: Stable/uncomplicated  EVALUATION COMPLEXITY: Low   GOALS:  SHORT TERM GOALS:   Pt will be independent and compliant with HEP for improved pain, ROM, strength, and function.  Baseline: Goal status: achieved 12/26 Target date:   05/31/2022   2.  Pt will tolerate initiation of AAROM without adverse effects for improved shoulder mobility Baseline:  Goal status: achieved 12/26 Target date:  05/16/2022   3.  Pt will demo L shoulder PROM to at least 110 deg in flexion and 35-40 deg in ER for improved ROM and stiffness. Baseline:  Goal status:Full 12/26 Target date:  05/24/2022     4.  Pt will demo AAROM to at least 120 deg and ER to 45 deg for improved shoulder mobility and function. Baseline:  Goal status: full 12/26 Target date:  05/31/2022  5.  Pt will wean out of sling without adverse effects per MD instruction.  Baseline:  Goal status : achieved 12/26 Target date:  05/31/2022  6.  Pt will be able to actively elevate L UE > 90 deg without significant pain. Baseline:  Goal status: achieved 12/26  Target date:  06/07/2022  7.  Pt will be able to perform self care activities with no > than minimal difficulty.   Goal status: achieved 12/26   Target date:  06/28/2022  8.  Pt will be able to perform her normal work activities without adverse effects.    Goal status:achieved 12/26  Target date:  06/14/2022    LONG TERM GOALS: Target date: 08/22/2022   Pt will demo L shoulder AROM to be [facility] t/o for performance of ADLs and IADLs. Baseline:  Goal status: using for all ADL's achieved 12/26  2.  Pt will be able to perform ADLs and IADLs without significant pain and difficulty.  Baseline:  Goal status:using without difficulty achieved 12/26   3.  Pt will be able to perform her normal reaching and overhead activities without significant pain and limitation.   Baseline:  Goal status: achieved   4.  Pt will demo at least 4+/5 MMT strength t/o L shoulder for functional lifting/carrying and to assist with returning to recreational activities.  Baseline:  Goal status: not tested but significant improvement     PLAN:  PT FREQUENCY: 1-2x/wk x 3 weeks and 2x/wk afterward  PT DURATION: other: 12-16  weeks  PLANNED INTERVENTIONS: Therapeutic exercises, Therapeutic activity, Neuromuscular re-education, Patient/Family education, Self Care, Joint mobilization, Aquatic Therapy, Dry Needling, Electrical stimulation, Spinal mobilization, Cryotherapy, Moist heat, Taping, Ultrasound, Manual therapy, and Re-evaluation  PLAN FOR NEXT SESSION:   Cont per biceps tenodesis protocol.  Op note  indicates: She will begin active range of motion at 2 weeks.  At this time she will plan for early and aggressive active range of motion without any rotator cuff restrictions.  ice as needed.  PN next visit.    Lorayne Bender PT DPT  07/02/22 9:48 AM

## 2022-07-04 ENCOUNTER — Encounter (HOSPITAL_BASED_OUTPATIENT_CLINIC_OR_DEPARTMENT_OTHER): Payer: Managed Care, Other (non HMO) | Admitting: Physical Therapy

## 2022-07-17 ENCOUNTER — Ambulatory Visit (HOSPITAL_BASED_OUTPATIENT_CLINIC_OR_DEPARTMENT_OTHER): Payer: Managed Care, Other (non HMO) | Admitting: Orthopaedic Surgery

## 2022-07-17 DIAGNOSIS — M7582 Other shoulder lesions, left shoulder: Secondary | ICD-10-CM

## 2022-07-17 NOTE — Progress Notes (Signed)
Post Operative Evaluation    Procedure/Date of Surgery: Left shoulder arthroscopy with biceps tenodesis and subacromial decompression 04/30/22  Interval History:   Presents today for follow-up of the above procedure.  Overall she is continuing to improve.  Her motion is dramatically improving.  She is working on strengthening of the left shoulder.  She is having some difficulty with overhead stroke although this continues to improve.  PMH/PSH/Family History/Social History/Meds/Allergies:    Past Medical History:  Diagnosis Date   Alcohol abuse    Cirrhosis of liver with ascites ([place]) 08/14/2020   Hyponatremia 08/15/2020   Macrocytosis 10/29/2020   Portal hypertension ([place]) 08/14/2020   Rotator cuff disorder    Past Surgical History:  Procedure Laterality Date   ANKLE SURGERY     x 2   BICEPT TENODESIS  04/30/2022   Procedure: BICEPS TENODESIS LEFT SHOULDER;  Surgeon: Vanetta Mulders, MD;  Location: [place];  Service: Orthopedics;;   CHEST TUBE INSERTION Left 06/27/2021   Procedure: CHEST TUBE INSERTION;  Surgeon: Laurin Coder, MD;  Location: [facility] [facility];  Service: Pulmonary;  Laterality: Left;   PARACENTESIS     SHOULDER ARTHROSCOPY WITH SUBACROMIAL DECOMPRESSION Left 04/30/2022   Procedure: LEFT SHOULDER ARTHROSCOPY WITH SUBACROMIAL DECOMPRESSION;  Surgeon: Vanetta Mulders, MD;  Location: [place];  Service: Orthopedics;  Laterality: Left;   THORACENTESIS N/A 06/18/2021   Procedure: Mathews Robinsons;  Surgeon: Freddi Starr, MD;  Location: [facility]OSCOPY;  Service: Pulmonary;  Laterality: N/A;   Social History   Socioeconomic History   Marital status: Married    Spouse name: Not on file   Number of children: Not on file   Years of education: Not on file   Highest education level: Not on file  Occupational History   Not on file  Tobacco Use   Smoking status: Former    Packs/day: 0.50     Years: 10.00    Total pack years: 5.00    Types: Cigarettes   Smokeless tobacco: Never  Vaping Use   Vaping Use: Former  Substance and Sexual Activity   Alcohol use: Not Currently   Drug use: Never   Sexual activity: Yes    Birth control/protection: Post-menopausal  Other Topics Concern   Not on file  Social History Narrative   Not on file   Social Determinants of Health   Financial Resource Strain: Not on file  Food Insecurity: Not on file  Transportation Needs: Not on file  Physical Activity: Not on file  Stress: Not on file  Social Connections: Not on file   Family History  Problem Relation Age of Onset   Pancreatic cancer Paternal Grandfather    Skin cancer Father    Huntington's disease Mother    Allergies  Allergen Reactions   Clemastine Rash and Other (See Comments)    Tavist     Hydrocodone Nausea And Vomiting   Ibuprofen Other (See Comments)    Intestinal reactions    Other Rash and Other (See Comments)    Albertson's Dayhist-D/generic of Tavist- (reaction to antihistamine that is no longer on the market)   Oxycodone Nausea Only and Other (See Comments)    Nausea to opioids     Current Outpatient Medications  Medication Sig Dispense Refill   aspirin EC 325 MG tablet Take 1  tablet (325 mg total) by mouth daily. 30 tablet 0   clindamycin (CLINDAGEL) 1 % gel Apply topically 2 (two) times daily. 30 g 11   clobetasol ointment (TEMOVATE) 1.61 % Apply 1 application topically 2 (two) times daily as needed (psoriasis- bilateral legs).     furosemide (LASIX) 20 MG tablet Take 20 mg by mouth every morning.     lidocaine (XYLOCAINE) 2 % solution Use as directed 5 mLs in the mouth or throat every 3 (three) hours as needed (tooth pain). (Patient not taking: Reported on 05/03/2022)     LORazepam (ATIVAN) 0.5 MG tablet Take by mouth. (Patient not taking: Reported on 05/03/2022)     meloxicam (MOBIC) 15 MG tablet Take 1 tablet (15 mg total) by mouth daily. 7 tablet 0    Potassium Chloride ER 20 MEQ TBCR Take 20 mEq by mouth every morning.     spironolactone (ALDACTONE) 100 MG tablet Take 1 tablet (100 mg total) by mouth daily. 30 tablet 3   triamcinolone cream (KENALOG) 0.1 % Apply 1 application topically daily as needed (psoriasis- bilateral legs).     TYLENOL CHILDRENS CHEWABLES 160 MG CHEW Chew 80 mg by mouth daily as needed (for headaches).     No current facility-administered medications for this visit.   No results found.  Review of Systems:   A ROS was performed including pertinent positives and negatives as documented in the HPI.   Musculoskeletal Exam:    There were no vitals taken for this visit.  Left shoulder incisions are well-healed.  Active forward elevation is to 160 degrees equal to the contralateral side degrees with pain.  External rotation at the side is to 55 degrees.  Internal rotation is deferred today.  2+ radial pulse with distal neurosensory exam intact  Imaging:      I personally reviewed and interpreted the radiographs.   Assessment:   47 year old female who is 10 weeks status post left shoulder rotator cuff debridement and biceps tenodesis overall continuing to improve.  At this time I would like her to continue work on strengthening and range of motion.  I will plan to see her back in 10 weeks for reassessment.  All limitations and restrictions were discussed  -Return to clinic in 10 weeks for reassessment    I personally saw and evaluated the patient, and participated in the management and treatment plan.  Vanetta Mulders, MD Attending Physician, Orthopedic Surgery  This document was dictated using Dragon voice recognition software. A reasonable attempt at proof reading has been made to minimize errors.

## 2022-09-12 ENCOUNTER — Encounter (HOSPITAL_BASED_OUTPATIENT_CLINIC_OR_DEPARTMENT_OTHER): Payer: Self-pay | Admitting: Family Medicine

## 2022-09-12 ENCOUNTER — Ambulatory Visit (HOSPITAL_BASED_OUTPATIENT_CLINIC_OR_DEPARTMENT_OTHER): Payer: Managed Care, Other (non HMO) | Admitting: Family Medicine

## 2022-09-12 VITALS — BP 121/76 | HR 89 | Ht 68.0 in | Wt 184.6 lb

## 2022-09-12 DIAGNOSIS — K7031 Alcoholic cirrhosis of liver with ascites: Secondary | ICD-10-CM

## 2022-09-12 NOTE — Assessment & Plan Note (Signed)
Given current symptoms of abdominal discomfort and concerns of possible underlying liver issue, would be reasonable to update labs at this time.  Discussed that reported symptoms are unlikely to represent any acute issues ongoing with liver, but certainly reasonable to update labs today Recommend continued follow-up with hepatologist

## 2022-09-12 NOTE — Progress Notes (Signed)
New Patient Office Visit  Subjective    Patient ID: Rebecca Bonilla, female    DOB: May 22, 1976  Age: 47 y.o. MRN: [id number]  CC:  Chief Complaint  Patient presents with   New Patient (Initial Visit)    Pt here to establish new care     HPI Rebecca Bonilla presents to establish care Last PCP - Dr. Valora Piccolo in [facility]  Cirrhosis: History of alcoholic cirrhosis of liver.  She does follow with liver specialist in [place].  She was on the liver transplant list at 1 point, however has been doing well in recent times and was actually removed from liver transplant list as a result.  She has been having some occasional abdominal pain, more so when she has had increased physical activity, specifically with abdominal related exercises.  She wonders if this may reflect any issues related to her liver.  Last labs were several months ago and were normal at that time.  Patient is originally from [place], [place]. She works as an Forensic psychologist. She enjoys food, cooking, gardening, knitting, dogs.   Outpatient Encounter Medications as of 09/12/2022  Medication Sig   clindamycin (CLINDAGEL) 1 % gel Apply topically 2 (two) times daily.   clobetasol ointment (TEMOVATE) AB-123456789 % Apply 1 application topically 2 (two) times daily as needed (psoriasis- bilateral legs).   furosemide (LASIX) 20 MG tablet Take 20 mg by mouth every morning.   Potassium Chloride ER 20 MEQ TBCR Take 20 mEq by mouth every morning.   spironolactone (ALDACTONE) 100 MG tablet Take 1 tablet (100 mg total) by mouth daily.   triamcinolone cream (KENALOG) 0.1 % Apply 1 application topically daily as needed (psoriasis- bilateral legs).   TYLENOL CHILDRENS CHEWABLES 160 MG CHEW Chew 80 mg by mouth daily as needed (for headaches).   lidocaine (XYLOCAINE) 2 % solution Use as directed 5 mLs in the mouth or throat every 3 (three) hours as needed (tooth pain). (Patient not taking: Reported on 05/03/2022)   [DISCONTINUED] aspirin EC 325 MG  tablet Take 1 tablet (325 mg total) by mouth daily.   [DISCONTINUED] LORazepam (ATIVAN) 0.5 MG tablet Take by mouth. (Patient not taking: Reported on 05/03/2022)   [DISCONTINUED] meloxicam (MOBIC) 15 MG tablet Take 1 tablet (15 mg total) by mouth daily.   No facility-administered encounter medications on file as of 09/12/2022.    Past Medical History:  Diagnosis Date   Alcohol abuse    Cirrhosis of liver with ascites ([place]) 08/14/2020   Hyponatremia 08/15/2020   Macrocytosis 10/29/2020   Portal hypertension ([place]) 08/14/2020   Rotator cuff disorder     Past Surgical History:  Procedure Laterality Date   ANKLE SURGERY     x 2   BICEPT TENODESIS  04/30/2022   Procedure: BICEPS TENODESIS LEFT SHOULDER;  Surgeon: Vanetta Mulders, MD;  Location: [place];  Service: Orthopedics;;   CHEST TUBE INSERTION Left 06/27/2021   Procedure: CHEST TUBE INSERTION;  Surgeon: Laurin Coder, MD;  Location: [place] [facility];  Service: Pulmonary;  Laterality: Left;   PARACENTESIS     SHOULDER ARTHROSCOPY WITH SUBACROMIAL DECOMPRESSION Left 04/30/2022   Procedure: LEFT SHOULDER ARTHROSCOPY WITH SUBACROMIAL DECOMPRESSION;  Surgeon: Vanetta Mulders, MD;  Location: [place];  Service: Orthopedics;  Laterality: Left;   THORACENTESIS N/A 06/18/2021   Procedure: Mathews Robinsons;  Surgeon: Freddi Starr, MD;  Location: [facility] ENDOSCOPY;  Service: Pulmonary;  Laterality: N/A;    Family History  Problem Relation Age of Onset  Pancreatic cancer Paternal Grandfather    Skin cancer Father    Huntington's disease Mother     Social History   Socioeconomic History   Marital status: Married    Spouse name: Not on file   Number of children: Not on file   Years of education: Not on file   Highest education level: Not on file  Occupational History   Not on file  Tobacco Use   Smoking status: Former    Packs/day: 0.50    Years: 10.00    Total pack years: 5.00    Types:  Cigarettes   Smokeless tobacco: Never  Vaping Use   Vaping Use: Former  Substance and Sexual Activity   Alcohol use: Not Currently   Drug use: Never   Sexual activity: Yes    Birth control/protection: Post-menopausal  Other Topics Concern   Not on file  Social History Narrative   Not on file   Social Determinants of Health   Financial Resource Strain: Not on file  Food Insecurity: Not on file  Transportation Needs: Not on file  Physical Activity: Not on file  Stress: Not on file  Social Connections: Not on file  Intimate Partner Violence: Not on file    Objective    BP 121/76 (BP Location: Left Arm, Patient Position: Sitting, Cuff Size: Large)   Pulse 89   Ht '5\' 8"'$  (1.727 m)   Wt 184 lb 9.6 oz (83.7 kg)   SpO2 100%   BMI 28.07 kg/m   Physical Exam  47 year old female in no acute distress Cardiovascular exam with regular rate and rhythm Lungs clear to auscultation bilaterally  Assessment & Plan:   Problem List Items Addressed This Visit       Digestive   Alcoholic cirrhosis of liver with ascites ([place]) - Primary    Given current symptoms of abdominal discomfort and concerns of possible underlying liver issue, would be reasonable to update labs at this time.  Discussed that reported symptoms are unlikely to represent any acute issues ongoing with liver, but certainly reasonable to update labs today Recommend continued follow-up with hepatologist      Relevant Orders   CBC with Differential/Platelet   Comprehensive metabolic panel   AFP tumor marker    Return in about 4 months (around 01/12/2023).   Sharne Linders J [place], MD

## 2022-09-18 ENCOUNTER — Encounter (HOSPITAL_BASED_OUTPATIENT_CLINIC_OR_DEPARTMENT_OTHER): Payer: Self-pay

## 2022-09-18 LAB — CBC WITH DIFFERENTIAL/PLATELET
Basophils Absolute: 0.1 10*3/uL (ref 0.0–0.2)
Basos: 1 %
EOS (ABSOLUTE): 0.1 10*3/uL (ref 0.0–0.4)
Eos: 1 %
Hematocrit: 45 % (ref 34.0–46.6)
Hemoglobin: 15.7 g/dL (ref 11.1–15.9)
Immature Grans (Abs): 0 10*3/uL (ref 0.0–0.1)
Immature Granulocytes: 0 %
Lymphocytes Absolute: 2.3 10*3/uL (ref 0.7–3.1)
Lymphs: 28 %
MCH: 33.2 pg — ABNORMAL HIGH (ref 26.6–33.0)
MCHC: 34.9 g/dL (ref 31.5–35.7)
MCV: 95 fL (ref 79–97)
Monocytes Absolute: 0.7 10*3/uL (ref 0.1–0.9)
Monocytes: 9 %
Neutrophils Absolute: 5 10*3/uL (ref 1.4–7.0)
Neutrophils: 61 %
Platelets: 106 10*3/uL — ABNORMAL LOW (ref 150–450)
RBC: 4.73 x10E6/uL (ref 3.77–5.28)
RDW: 13.6 % (ref 11.7–15.4)
WBC: 8.2 10*3/uL (ref 3.4–10.8)

## 2022-09-18 LAB — COMPREHENSIVE METABOLIC PANEL
ALT: 34 IU/L — ABNORMAL HIGH (ref 0–32)
AST: 45 IU/L — ABNORMAL HIGH (ref 0–40)
Albumin: 3.9 g/dL (ref 3.9–4.9)
Alkaline Phosphatase: 156 IU/L — ABNORMAL HIGH (ref 44–121)
BUN/Creatinine Ratio: 17 (ref 9–23)
BUN: 16 mg/dL (ref 6–24)
CO2: 21 mmol/L (ref 20–29)
Calcium: 9.5 mg/dL (ref 8.7–10.2)
Chloride: 97 mmol/L (ref 96–106)
Creatinine, Ser: 0.93 mg/dL (ref 0.57–1.00)
Glucose: 94 mg/dL (ref 70–99)
Potassium: 4.7 mmol/L (ref 3.5–5.2)
Sodium: 134 mmol/L (ref 134–144)
eGFR: 76 mL/min/{1.73_m2} (ref >59)

## 2022-09-18 LAB — AFP TUMOR MARKER: AFP, Serum, Tumor Marker: 3.1 ng/mL (ref 0.0–6.4)

## 2022-09-19 ENCOUNTER — Encounter (HOSPITAL_BASED_OUTPATIENT_CLINIC_OR_DEPARTMENT_OTHER): Payer: Self-pay | Admitting: Family Medicine

## 2022-09-26 ENCOUNTER — Ambulatory Visit (HOSPITAL_BASED_OUTPATIENT_CLINIC_OR_DEPARTMENT_OTHER): Payer: Managed Care, Other (non HMO) | Admitting: Orthopaedic Surgery

## 2022-09-26 ENCOUNTER — Encounter: Payer: Self-pay | Admitting: Obstetrics and Gynecology

## 2022-09-26 ENCOUNTER — Ambulatory Visit: Payer: Managed Care, Other (non HMO) | Admitting: Obstetrics and Gynecology

## 2022-09-26 VITALS — BP 114/73 | HR 77 | Ht 68.0 in | Wt 183.0 lb

## 2022-09-26 DIAGNOSIS — N912 Amenorrhea, unspecified: Secondary | ICD-10-CM | POA: Diagnosis not present

## 2022-09-26 DIAGNOSIS — M7582 Other shoulder lesions, left shoulder: Secondary | ICD-10-CM

## 2022-09-26 DIAGNOSIS — N943 Premenstrual tension syndrome: Secondary | ICD-10-CM | POA: Diagnosis not present

## 2022-09-26 DIAGNOSIS — L732 Hidradenitis suppurativa: Secondary | ICD-10-CM | POA: Diagnosis not present

## 2022-09-26 MED ORDER — BUSPIRONE HCL 5 MG PO TABS
5.0000 mg | ORAL_TABLET | Freq: Two times a day (BID) | ORAL | 6 refills | Status: AC
  Filled 2023-07-04 – 2023-07-05 (×2): qty 180, 90d supply, fill #0
  Filled 2023-09-30: qty 180, 90d supply, fill #1

## 2022-09-26 MED ORDER — CLINDAMYCIN PHOSPHATE 1 % EX GEL: Freq: Two times a day (BID) | CUTANEOUS | 11 refills | Status: DC

## 2022-09-26 NOTE — Progress Notes (Signed)
GYNECOLOGY OFFICE VISIT NOTE  History:   Rebecca Bonilla is a 47 y.o. G1P0010 here today for worsening anxiety about the 7 days prior to her period. Started in the winter in 2023 and then in January she got her period back and it was noticeably in the week prior to her period. She has symptoms of high anxiety that is about eveyrthing and is all day. It affects her functioning at work. She also notes some brain fog.   She has had a monthly cycle since January but notes it is light.   She also notes her HS has flared but primarily around the time of her menses.  She does need a refill of her medication.    Past Medical History:  Diagnosis Date   Alcohol abuse    Cirrhosis of liver with ascites ([place]) 08/14/2020   Hyponatremia 08/15/2020   Macrocytosis 10/29/2020   Portal hypertension ([place]) 08/14/2020   Rotator cuff disorder     Past Surgical History:  Procedure Laterality Date   ANKLE SURGERY     x 2   BICEPT TENODESIS  04/30/2022   Procedure: BICEPS TENODESIS LEFT SHOULDER;  Surgeon: Vanetta Mulders, MD;  Location: [place];  Service: Orthopedics;;   CHEST TUBE INSERTION Left 06/27/2021   Procedure: CHEST TUBE INSERTION;  Surgeon: Laurin Coder, MD;  Location: [place] [facility];  Service: Pulmonary;  Laterality: Left;   PARACENTESIS     SHOULDER ARTHROSCOPY WITH SUBACROMIAL DECOMPRESSION Left 04/30/2022   Procedure: LEFT SHOULDER ARTHROSCOPY WITH SUBACROMIAL DECOMPRESSION;  Surgeon: Vanetta Mulders, MD;  Location: [place];  Service: Orthopedics;  Laterality: Left;   THORACENTESIS N/A 06/18/2021   Procedure: Mathews Robinsons;  Surgeon: Freddi Starr, MD;  Location: [facility] ENDOSCOPY;  Service: Pulmonary;  Laterality: N/A;    The following portions of the patient's history were reviewed and updated as appropriate: allergies, current medications, past family history, past medical history, past social history, past surgical history and problem  list.   Health Maintenance:   Normal pap and negative HRHPV:   Diagnosis  Date Value Ref Range Status  08/23/2021   Final   - Negative for intraepithelial lesion or malignancy (NILM)   Review of Systems:  Pertinent items noted in HPI and remainder of comprehensive ROS otherwise negative.  Physical Exam:  BP 114/73   Pulse 77   Ht 5\' 8"  (1.727 m)   Wt 183 lb (83 kg)   BMI 27.83 kg/m  CONSTITUTIONAL: Well-developed, well-nourished female in no acute distress.  HEENT:  Normocephalic, atraumatic. External right and left ear normal. No scleral icterus.  NECK: Normal range of motion, supple, no masses noted on observation SKIN: No rash noted. Not diaphoretic. No erythema. No pallor. MUSCULOSKELETAL: Normal range of motion. No edema noted. NEUROLOGIC: Alert and oriented to person, place, and time. Normal muscle tone coordination. No cranial nerve deficit noted. PSYCHIATRIC: Normal mood and affect. Normal behavior. Normal judgment and thought content.   PELVIC: Normal appearing external genitalia; normal urethral meatus; normal appearing vaginal mucosa and cervix.  No abnormal discharge noted. Performed in the presence of a chaperone  Labs and Imaging No results found for this or any previous visit (from the past 168 hour(s)). No results found.  Assessment and Plan:   1. Amenorrhea Given return of menses, we will recheck [facility] but may also be normal cycle resuming given prior [facility] one year ago was normal. Will also check [place] for structural findings. Suspect it is normal given regularity.  -  [place] - US PELVIC COMPLETE WITH TRANSVAGINAL; Future  2. PMS (premenstrual syndrome) Discussed options to help with anxiety prior to menses. Discussed prozac vs buspar. Given she is primarily anxiety and sounds generalized, I think buspar would be a better fit. We discussed dosing and titration of dose. Discussed starting at 1-2 weeks prior to menses.  - busPIRone (BUSPAR) 5 MG tablet; Take 1 tablet (5  mg total) by mouth 2 (two) times daily. May titrate up to 15 mg three times a day. Take the 1-2 weeks prior to menses.  Dispense: 60 tablet; Refill: 6  3. Hidradenitis suppurativa Refills sent.  - clindamycin (CLINDAGEL) 1 % gel; Apply topically 2 (two) times daily.  Dispense: 30 g; Refill: 11  Routine preventative health maintenance measures emphasized. Please refer to After Visit Summary for other counseling recommendations.   Return if symptoms worsen or fail to improve, for pelvic ultrasound.  Radene Gunning, MD, [place] for [facility], [place]

## 2022-09-26 NOTE — Progress Notes (Signed)
Post Operative Evaluation    Procedure/Date of Surgery: Left shoulder arthroscopy with biceps tenodesis and subacromial decompression 04/30/22  Interval History:   Presents today for follow-up of the above procedure.  Overall she is continuing to improve.  She is now back to swimming laps without difficulty.  She is able to freestyle swim without any pain.  She is overall very happy with how she is doing. PMH/PSH/Family History/Social History/Meds/Allergies:    Past Medical History:  Diagnosis Date   Alcohol abuse    Cirrhosis of liver with ascites ([place]) 08/14/2020   Hyponatremia 08/15/2020   Macrocytosis 10/29/2020   Portal hypertension ([place]) 08/14/2020   Rotator cuff disorder    Past Surgical History:  Procedure Laterality Date   ANKLE SURGERY     x 2   BICEPT TENODESIS  04/30/2022   Procedure: BICEPS TENODESIS LEFT SHOULDER;  Surgeon: Vanetta Mulders, MD;  Location: [place];  Service: Orthopedics;;   CHEST TUBE INSERTION Left 06/27/2021   Procedure: CHEST TUBE INSERTION;  Surgeon: Laurin Coder, MD;  Location: [place] [facility];  Service: Pulmonary;  Laterality: Left;   PARACENTESIS     SHOULDER ARTHROSCOPY WITH SUBACROMIAL DECOMPRESSION Left 04/30/2022   Procedure: LEFT SHOULDER ARTHROSCOPY WITH SUBACROMIAL DECOMPRESSION;  Surgeon: Vanetta Mulders, MD;  Location: [place];  Service: Orthopedics;  Laterality: Left;   THORACENTESIS N/A 06/18/2021   Procedure: Mathews Robinsons;  Surgeon: Freddi Starr, MD;  Location: [facility] ENDOSCOPY;  Service: Pulmonary;  Laterality: N/A;   Social History   Socioeconomic History   Marital status: Married    Spouse name: Not on file   Number of children: Not on file   Years of education: Not on file   Highest education level: Not on file  Occupational History   Not on file  Tobacco Use   Smoking status: Former    Packs/day: 0.50    Years: 10.00    Additional pack  years: 0.00    Total pack years: 5.00    Types: Cigarettes   Smokeless tobacco: Never  Vaping Use   Vaping Use: Former  Substance and Sexual Activity   Alcohol use: Not Currently   Drug use: Never   Sexual activity: Yes    Birth control/protection: Post-menopausal  Other Topics Concern   Not on file  Social History Narrative   Not on file   Social Determinants of Health   Financial Resource Strain: Not on file  Food Insecurity: Not on file  Transportation Needs: Not on file  Physical Activity: Not on file  Stress: Not on file  Social Connections: Not on file   Family History  Problem Relation Age of Onset   Pancreatic cancer Paternal Grandfather    Skin cancer Father    Huntington's disease Mother    Allergies  Allergen Reactions   Clemastine Rash and Other (See Comments)    Tavist     Hydrocodone Nausea And Vomiting   Ibuprofen Other (See Comments)    Intestinal reactions    Other Rash and Other (See Comments)    Albertson's Dayhist-D/generic of Tavist- (reaction to antihistamine that is no longer on the market)   Oxycodone Nausea Only and Other (See Comments)    Nausea to opioids     Current Outpatient Medications  Medication Sig Dispense Refill  clindamycin (CLINDAGEL) 1 % gel Apply topically 2 (two) times daily. 30 g 11   clobetasol ointment (TEMOVATE) AB-123456789 % Apply 1 application topically 2 (two) times daily as needed (psoriasis- bilateral legs).     furosemide (LASIX) 20 MG tablet Take 20 mg by mouth every morning.     lidocaine (XYLOCAINE) 2 % solution Use as directed 5 mLs in the mouth or throat every 3 (three) hours as needed (tooth pain). (Patient not taking: Reported on 05/03/2022)     Potassium Chloride ER 20 MEQ TBCR Take 20 mEq by mouth every morning.     spironolactone (ALDACTONE) 100 MG tablet Take 1 tablet (100 mg total) by mouth daily. 30 tablet 3   triamcinolone cream (KENALOG) 0.1 % Apply 1 application topically daily as needed (psoriasis-  bilateral legs).     TYLENOL CHILDRENS CHEWABLES 160 MG CHEW Chew 80 mg by mouth daily as needed (for headaches).     No current facility-administered medications for this visit.   No results found.  Review of Systems:   A ROS was performed including pertinent positives and negatives as documented in the HPI.   Musculoskeletal Exam:    There were no vitals taken for this visit.  Left shoulder incisions are well-healed.  Active forward elevation is to 160 degrees equal to the contralateral side degrees with pain.  External rotation at the side is to 55 degrees.  Internal rotation is deferred today.  2+ radial pulse with distal neurosensory exam intact  Imaging:      I personally reviewed and interpreted the radiographs.   Assessment:   47 year old female who is status post left shoulder rotator cuff debridement biceps tenodesis overall doing extremely well.  She is now on the freestyle swim without any issues.  This time I will plan to see her back as needed    I personally saw and evaluated the patient, and participated in the management and treatment plan.  Vanetta Mulders, MD Attending Physician, Orthopedic Surgery  This document was dictated using Dragon voice recognition software. A reasonable attempt at proof reading has been made to minimize errors.

## 2022-09-27 LAB — FOLLICLE STIMULATING HORMONE: FSH: 24.6 m[IU]/mL

## 2022-10-01 ENCOUNTER — Other Ambulatory Visit: Payer: Managed Care, Other (non HMO)

## 2022-10-15 ENCOUNTER — Ambulatory Visit: Payer: Managed Care, Other (non HMO) | Admitting: Dermatology

## 2022-11-25 ENCOUNTER — Other Ambulatory Visit (HOSPITAL_BASED_OUTPATIENT_CLINIC_OR_DEPARTMENT_OTHER): Payer: Self-pay

## 2022-11-25 ENCOUNTER — Telehealth (HOSPITAL_BASED_OUTPATIENT_CLINIC_OR_DEPARTMENT_OTHER): Payer: Self-pay | Admitting: Family Medicine

## 2022-11-25 NOTE — Telephone Encounter (Signed)
Please send in refills for Furosemide & Potassium to the pharmacy.

## 2022-11-25 NOTE — Telephone Encounter (Signed)
You have not prescribed these for patient, they are from a historical provider. Okay to fill?

## 2022-11-28 IMAGING — US US THORACENTESIS ASP PLEURAL SPACE W/IMG GUIDE
1 series · 1 of 1 positions shown · non-contrast
Comparison: none

INDICATION: Patient with history of cirrhosis, portal hypertension, pleuritic
chest pain, cough, dyspnea, left pleural effusion. Request received
for diagnostic and therapeutic left thoracentesis.

[Series 1: us thoracentesis asp pleural s mc & wl · 1 of 1 slices shown]
[im 1/1]
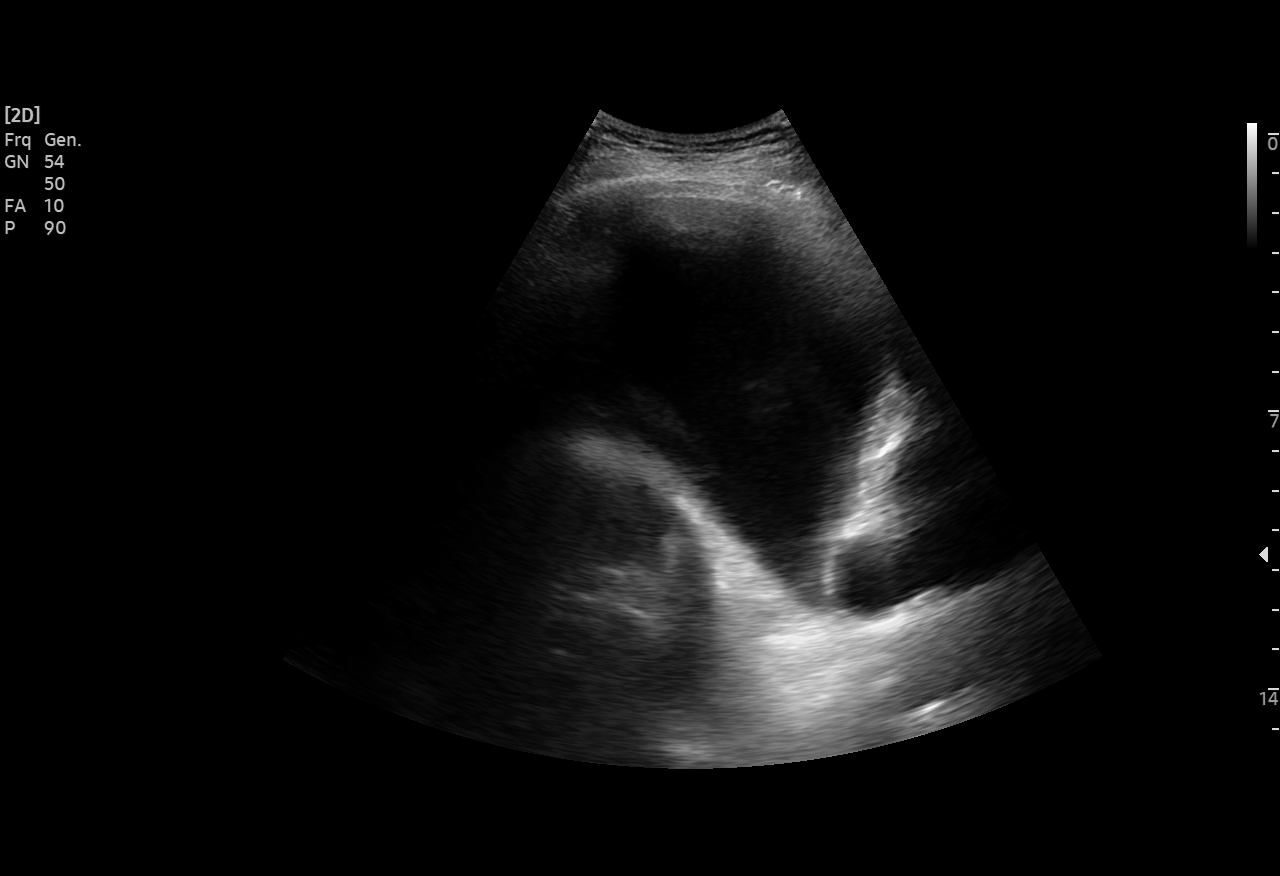

[1 of 1 positions shown; findings below may reference images not displayed]

EXAM:
ULTRASOUND GUIDED DIAGNOSTIC AND THERAPEUTIC LEFT THORACENTESIS

MEDICATIONS:
1% lidocaine to skin and subcutaneous tissue

COMPLICATIONS:
None immediate.

PROCEDURE:
An ultrasound guided thoracentesis was thoroughly discussed with the
patient and questions answered. The benefits, risks, alternatives
and complications were also discussed. The patient understands and
wishes to proceed with the procedure. Written consent was obtained.

Ultrasound was performed to localize and mark an adequate pocket of
fluid in the left chest. The area was then prepped and draped in the
normal sterile fashion. 1% Lidocaine was used for local anesthesia.
Under ultrasound guidance a 6 Fr Safe-T-Centesis catheter was
introduced. Thoracentesis was performed. The catheter was removed
and a dressing applied.
FINDINGS: A total of approximately 670 cc of bloody fluid was removed. Samples
were sent to the laboratory as requested by the clinical team.
Patient did experience brief episode of nausea and lightheadedness
during procedure with transient drop in blood pressure/vasovagal
response. The procedure was stopped and patient given oral fluids,
positioned supine on stretcher and given cool compress to forehead
with resolution of symptoms. Blood pressure postprocedure was
stable.
IMPRESSION: Successful ultrasound guided diagnostic and therapeutic left
thoracentesis yielding 670 cc of pleural fluid. Ordering Koke Loren
aware of above findings.

## 2022-12-03 ENCOUNTER — Other Ambulatory Visit (HOSPITAL_BASED_OUTPATIENT_CLINIC_OR_DEPARTMENT_OTHER): Payer: Self-pay

## 2022-12-03 MED ORDER — POTASSIUM CHLORIDE ER 20 MEQ PO TBCR: 20.0000 meq | EXTENDED_RELEASE_TABLET | Freq: Every morning | ORAL | 0 refills | Status: DC

## 2022-12-03 MED ORDER — FUROSEMIDE 20 MG PO TABS: 20.0000 mg | ORAL_TABLET | Freq: Every morning | ORAL | 0 refills | Status: DC

## 2022-12-03 NOTE — Telephone Encounter (Signed)
Medication has been sent in 

## 2022-12-17 ENCOUNTER — Encounter (HOSPITAL_BASED_OUTPATIENT_CLINIC_OR_DEPARTMENT_OTHER): Payer: Self-pay | Admitting: Family Medicine

## 2022-12-17 ENCOUNTER — Other Ambulatory Visit (HOSPITAL_BASED_OUTPATIENT_CLINIC_OR_DEPARTMENT_OTHER): Payer: Self-pay

## 2022-12-23 ENCOUNTER — Telehealth: Payer: Self-pay | Admitting: Gastroenterology

## 2022-12-23 NOTE — Telephone Encounter (Signed)
Called patient to schedule office visit. Left voicemail.

## 2022-12-23 NOTE — Telephone Encounter (Signed)
Hi Dr. Adela Lank,     This patient contacted [place] to schedule a colonoscopy and endoscopy. She is currently patient with [facility]. She stated her provider requested to have her procedures preformed with [facility], she also only sees provided for follow up office visits. She also stated she lives in [place] so it would be easier to have procedures with [place]. Her records are in [facility] for you to review and advise on scheduling.    Thank you.

## 2022-12-23 NOTE — Telephone Encounter (Signed)
You can book her in the office for a new patient visit. Chart reviewed. Thanks

## 2022-12-25 ENCOUNTER — Encounter: Payer: Self-pay | Admitting: Gastroenterology

## 2023-01-07 ENCOUNTER — Other Ambulatory Visit (HOSPITAL_BASED_OUTPATIENT_CLINIC_OR_DEPARTMENT_OTHER): Payer: Managed Care, Other (non HMO)

## 2023-01-07 ENCOUNTER — Other Ambulatory Visit (HOSPITAL_BASED_OUTPATIENT_CLINIC_OR_DEPARTMENT_OTHER): Payer: Self-pay | Admitting: Family Medicine

## 2023-01-10 ENCOUNTER — Other Ambulatory Visit (HOSPITAL_BASED_OUTPATIENT_CLINIC_OR_DEPARTMENT_OTHER): Payer: Self-pay | Admitting: Family Medicine

## 2023-01-10 ENCOUNTER — Other Ambulatory Visit (HOSPITAL_BASED_OUTPATIENT_CLINIC_OR_DEPARTMENT_OTHER): Payer: Managed Care, Other (non HMO)

## 2023-01-11 LAB — CBC WITH DIFFERENTIAL/PLATELET
Basophils Absolute: 0.1 10*3/uL (ref 0.0–0.2)
Basos: 1 %
EOS (ABSOLUTE): 0.1 10*3/uL (ref 0.0–0.4)
Eos: 2 %
Hematocrit: 43 % (ref 34.0–46.6)
Hemoglobin: 14.8 g/dL (ref 11.1–15.9)
Immature Grans (Abs): 0 10*3/uL (ref 0.0–0.1)
Immature Granulocytes: 0 %
Lymphocytes Absolute: 1.6 10*3/uL (ref 0.7–3.1)
Lymphs: 29 %
MCH: 32.7 pg (ref 26.6–33.0)
MCHC: 34.4 g/dL (ref 31.5–35.7)
MCV: 95 fL (ref 79–97)
Monocytes Absolute: 0.5 10*3/uL (ref 0.1–0.9)
Monocytes: 9 %
Neutrophils Absolute: 3.2 10*3/uL (ref 1.4–7.0)
Neutrophils: 59 %
Platelets: 90 10*3/uL — CL (ref 150–450)
RBC: 4.53 x10E6/uL (ref 3.77–5.28)
RDW: 13.3 % (ref 11.7–15.4)
WBC: 5.4 10*3/uL (ref 3.4–10.8)

## 2023-01-11 LAB — COMPREHENSIVE METABOLIC PANEL
ALT: 18 IU/L (ref 0–32)
AST: 31 IU/L (ref 0–40)
Albumin: 3.9 g/dL (ref 3.9–4.9)
Alkaline Phosphatase: 114 IU/L (ref 44–121)
BUN/Creatinine Ratio: 11 (ref 9–23)
BUN: 11 mg/dL (ref 6–24)
Bilirubin Total: 1.8 mg/dL — ABNORMAL HIGH (ref 0.0–1.2)
CO2: 19 mmol/L — ABNORMAL LOW (ref 20–29)
Calcium: 9.4 mg/dL (ref 8.7–10.2)
Chloride: 103 mmol/L (ref 96–106)
Creatinine, Ser: 0.99 mg/dL (ref 0.57–1.00)
Globulin, Total: 2.6 g/dL (ref 1.5–4.5)
Glucose: 96 mg/dL (ref 70–99)
Potassium: 4.8 mmol/L (ref 3.5–5.2)
Sodium: 137 mmol/L (ref 134–144)
Total Protein: 6.5 g/dL (ref 6.0–8.5)
eGFR: 70 mL/min/{1.73_m2} (ref >59)

## 2023-01-11 LAB — LIPID PANEL
Chol/HDL Ratio: 2.7 ratio (ref 0.0–4.4)
Cholesterol, Total: 165 mg/dL (ref 100–199)
HDL: 62 mg/dL (ref >39)
LDL Chol Calc (NIH): 91 mg/dL (ref 0–99)
Triglycerides: 60 mg/dL (ref 0–149)
VLDL Cholesterol Cal: 12 mg/dL (ref 5–40)

## 2023-01-11 LAB — TSH RFX ON ABNORMAL TO FREE T4: TSH: 0.857 u[IU]/mL (ref 0.450–4.500)

## 2023-01-11 LAB — HEMOGLOBIN A1C
Est. average glucose Bld gHb Est-mCnc: 85 mg/dL
Hgb A1c MFr Bld: 4.6 % — ABNORMAL LOW (ref 4.8–5.6)

## 2023-01-14 ENCOUNTER — Encounter (HOSPITAL_BASED_OUTPATIENT_CLINIC_OR_DEPARTMENT_OTHER): Payer: Managed Care, Other (non HMO) | Admitting: Family Medicine

## 2023-01-23 ENCOUNTER — Telehealth (HOSPITAL_BASED_OUTPATIENT_CLINIC_OR_DEPARTMENT_OTHER): Payer: Self-pay | Admitting: *Deleted

## 2023-01-23 ENCOUNTER — Other Ambulatory Visit (HOSPITAL_BASED_OUTPATIENT_CLINIC_OR_DEPARTMENT_OTHER): Payer: Self-pay | Admitting: Family Medicine

## 2023-01-23 ENCOUNTER — Other Ambulatory Visit (HOSPITAL_BASED_OUTPATIENT_CLINIC_OR_DEPARTMENT_OTHER): Payer: Self-pay | Admitting: *Deleted

## 2023-01-23 DIAGNOSIS — Z1231 Encounter for screening mammogram for malignant neoplasm of breast: Secondary | ICD-10-CM

## 2023-01-23 NOTE — Telephone Encounter (Signed)
Called pt about colon cancer screening. Pt is scheduled 04/24/2023 [facility]. Pt wanted to have mammogram ordered as it has been a year since last one. Mammogram ordered for pt.

## 2023-01-29 ENCOUNTER — Other Ambulatory Visit (HOSPITAL_BASED_OUTPATIENT_CLINIC_OR_DEPARTMENT_OTHER): Payer: Self-pay | Admitting: Family Medicine

## 2023-01-30 ENCOUNTER — Ambulatory Visit (INDEPENDENT_AMBULATORY_CARE_PROVIDER_SITE_OTHER): Payer: Managed Care, Other (non HMO) | Admitting: Family Medicine

## 2023-01-30 ENCOUNTER — Encounter (HOSPITAL_BASED_OUTPATIENT_CLINIC_OR_DEPARTMENT_OTHER): Payer: Self-pay | Admitting: Family Medicine

## 2023-01-30 VITALS — BP 114/77 | HR 84 | Ht 68.0 in | Wt 182.1 lb

## 2023-01-30 DIAGNOSIS — H9202 Otalgia, left ear: Secondary | ICD-10-CM | POA: Insufficient documentation

## 2023-01-30 DIAGNOSIS — Z0000 Encounter for general adult medical examination without abnormal findings: Secondary | ICD-10-CM | POA: Diagnosis not present

## 2023-01-30 MED ORDER — CEFDINIR 300 MG PO CAPS: 300.0000 mg | ORAL_CAPSULE | Freq: Two times a day (BID) | ORAL | 0 refills | Status: DC

## 2023-01-30 NOTE — Assessment & Plan Note (Signed)
Patient reports left ear pain for the past week, has worsened some of the last couple days, worried about ear infection.  No fever, chills, sweats.  Does have some sinus congestion as well. On exam, does have some bulging of the tympanic membrane, no purulent fluid noted however, external auditory canal is clear. Can proceed with antibiotic therapy at this time, instructed on proper use.  Will utilize cefdinir given underlying liver disease and thus would like to avoid Augmentin due to clavulanic acid component Discussed measures related to sinus congestion

## 2023-01-30 NOTE — Assessment & Plan Note (Addendum)
Routine HCM labs reviewed. HCM reviewed/discussed. Anticipatory guidance regarding healthy weight, lifestyle and choices given. Recommend healthy diet.  Recommend approximately 150 minutes/week of moderate intensity exercise Recommend regular dental and vision exams Always use seatbelt/lap and shoulder restraints Recommend using smoke alarms and checking batteries at least twice a year Recommend using sunscreen when outside Mammogram scheduled Has endoscopy and colonoscopy scheduled Discussed tetanus immunization recommendations, patient would like to return to have this completed

## 2023-01-30 NOTE — Progress Notes (Signed)
Subjective:    CC: Annual Physical Exam  HPI:  Rebecca Bonilla is a 47 y.o. presenting for annual physical  I reviewed the past medical history, family history, social history, surgical history, and allergies today and no changes were needed.  Please see the problem list section below in epic for further details.  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   Hyponatremia 08/15/2020   Macrocytosis 10/29/2020   Portal hypertension (HCC) 08/14/2020   Rotator cuff disorder    Past Surgical History: Past Surgical History:  Procedure Laterality Date   ANKLE SURGERY     x 2   BICEPT TENODESIS  04/30/2022   Procedure: BICEPS TENODESIS LEFT SHOULDER;  Surgeon: Huel Cote, MD;  Location: [facility];  Service: Orthopedics;;   CHEST TUBE INSERTION Left 06/27/2021   Procedure: CHEST TUBE INSERTION;  Surgeon: Tomma Lightning, MD;  Location: [facility];  Service: Pulmonary;  Laterality: Left;   PARACENTESIS     SHOULDER ARTHROSCOPY WITH SUBACROMIAL DECOMPRESSION Left 04/30/2022   Procedure: LEFT SHOULDER ARTHROSCOPY WITH SUBACROMIAL DECOMPRESSION;  Surgeon: Huel Cote, MD;  Location: [facility];  Service: Orthopedics;  Laterality: Left;   THORACENTESIS N/A 06/18/2021   Procedure: Alanson Puls;  Surgeon: [name], MD;  Location: [facility];  Service: Pulmonary;  Laterality: N/A;   Social History: Social History   Socioeconomic History   Marital status: Married    Spouse name: Not on file   Number of children: Not on file   Years of education: Not on file   Highest education level: Not on file  Occupational History   Not on file  Tobacco Use   Smoking status: Former    Current packs/day: 0.50    Average packs/day: 0.5 packs/day for 10.0 years (5.0 ttl pk-yrs)    Types: Cigarettes   Smokeless tobacco: Never  Vaping Use   Vaping  status: Former  Substance and Sexual Activity   Alcohol use: Not Currently   Drug use: Never   Sexual activity: Yes    Birth control/protection: Post-menopausal  Other Topics Concern   Not on file  Social History Narrative   Not on file   Social Determinants of Health   Financial Resource Strain: Low Risk  (12/19/2020)   Received from [facility], [facility]   Overall Financial Resource Strain (CARDIA)    Difficulty of Paying Living Expenses: Not hard at all  Food Insecurity: No Food Insecurity (09/18/2021)   Received from [facility], [facility]   Hunger Vital Sign    Worried About Running Out of Food in the Last Year: Never true    Ran Out of Food in the Last Year: Never true  Transportation Needs: No Transportation Needs (12/19/2020)   Received from [facility], [facility]   PRAPARE - Transportation    Lack of Transportation (Medical): No    Lack of Transportation (Non-Medical): No  Physical Activity: Insufficiently Active (12/19/2020)   Received from [facility], [facility]   Exercise Vital Sign    Days of Exercise per Week: 3 days    Minutes of Exercise per Session: 30 min  Stress: No Stress Concern Present (12/19/2020)   Received from [facility] - Occupational Stress Questionnaire    Feeling of Stress : Not at all  Social Connections: Unknown (11/09/2021)   Received from [facility], [facility]   Social Network    Social Network: Not on file   Family History: Family History  Problem Relation Age of Onset   Pancreatic cancer Paternal Grandfather    Skin cancer Father    Huntington's disease Mother    Allergies: Allergies  Allergen Reactions   Clemastine Rash and Other (See Comments)    Tavist     Hydrocodone Nausea And Vomiting   Ibuprofen Other (See Comments)    Intestinal reactions    Other Rash and Other (See Comments)    Albertson's Dayhist-D/generic of Tavist- (reaction  to antihistamine that is no longer on the market)   Oxycodone Nausea Only and Other (See Comments)    Nausea to opioids     Medications: See med rec.  Review of Systems: No headache, visual changes, nausea, vomiting, diarrhea, constipation, dizziness, abdominal pain, skin rash, fevers, chills, night sweats, swollen lymph nodes, weight loss, chest pain, body aches, joint swelling, muscle aches, shortness of breath, mood changes, visual or auditory hallucinations.  Objective:    BP 114/77   Pulse 84   Ht 5\' 8"  (1.727 m)   Wt 182 lb 1.6 oz (82.6 kg)   SpO2 99%   BMI 27.69 kg/m   General: Well Developed, well nourished, and in no acute distress. Neuro: Alert and oriented x3, extra-ocular muscles intact, sensation grossly intact. Cranial nerves II through XII are intact, motor, sensory, and coordinative functions are all intact. HEENT: Normocephalic, atraumatic, pupils equal round reactive to light, neck supple, no masses, no lymphadenopathy, thyroid nonpalpable. Oropharynx, nasopharynx, external ear canals are unremarkable. Skin: Warm and dry, no rashes noted. Cardiac: Regular rate and rhythm, no murmurs rubs or gallops. Respiratory: Clear to auscultation bilaterally. Not using accessory muscles, speaking in full sentences. Abdominal: Soft, nontender, nondistended, positive bowel sounds, no masses, no organomegaly. Musculoskeletal: Shoulder, elbow, wrist, hip, knee, ankle stable, and with full range of motion.  Impression and Recommendations:    Wellness examination Assessment & Plan: Routine HCM labs reviewed. HCM reviewed/discussed. Anticipatory guidance regarding healthy weight, lifestyle and choices given. Recommend healthy diet.  Recommend approximately 150 minutes/week of moderate intensity exercise Recommend regular dental and vision exams Always use seatbelt/lap and shoulder restraints Recommend using smoke alarms and checking batteries at least twice a year Recommend using  sunscreen when outside Mammogram scheduled Has endoscopy and colonoscopy scheduled Discussed tetanus immunization recommendations, patient would like to return to have this completed   Left ear pain Assessment & Plan: Patient reports left ear pain for the past week, has worsened some of the last couple days, worried about ear infection.  No fever, chills, sweats.  Does have some sinus congestion as well. On exam, does have some bulging of the tympanic membrane, no purulent fluid noted however, external auditory canal is clear. Can proceed with antibiotic therapy at this time, instructed on proper use.  Will utilize cefdinir given underlying liver disease and thus would like to avoid Augmentin due to clavulanic acid component Discussed measures related to sinus congestion   Other orders -     Cefdinir; Take 1 capsule (300 mg total) by mouth 2 (two) times daily.  Dispense: 10 capsule; Refill: 0  Return in about 6 months (around 08/02/2023) for low platelet count, general medical.   ___________________________________________ Laterra Lubinski de [place], MD, ABFM, CAQSM Primary Care and Sports Medicine [facility]

## 2023-02-07 ENCOUNTER — Ambulatory Visit: Payer: Managed Care, Other (non HMO)

## 2023-02-12 ENCOUNTER — Other Ambulatory Visit: Payer: Self-pay | Admitting: Family Medicine

## 2023-02-12 DIAGNOSIS — Z1231 Encounter for screening mammogram for malignant neoplasm of breast: Secondary | ICD-10-CM

## 2023-02-20 ENCOUNTER — Ambulatory Visit (HOSPITAL_BASED_OUTPATIENT_CLINIC_OR_DEPARTMENT_OTHER): Payer: BC Managed Care – PPO | Admitting: Orthopaedic Surgery

## 2023-02-20 ENCOUNTER — Ambulatory Visit (INDEPENDENT_AMBULATORY_CARE_PROVIDER_SITE_OTHER): Payer: BC Managed Care – PPO

## 2023-02-20 ENCOUNTER — Ambulatory Visit
Admission: RE | Admit: 2023-02-20 | Discharge: 2023-02-20 | Disposition: A | Discharge status: Home / Self Care | Payer: BC Managed Care – PPO | Source: Ambulatory Visit

## 2023-02-20 DIAGNOSIS — M25571 Pain in right ankle and joints of right foot: Secondary | ICD-10-CM

## 2023-02-20 DIAGNOSIS — M7661 Achilles tendinitis, right leg: Secondary | ICD-10-CM

## 2023-02-20 DIAGNOSIS — Z1231 Encounter for screening mammogram for malignant neoplasm of breast: Secondary | ICD-10-CM

## 2023-02-20 NOTE — Addendum Note (Signed)
Addended by: Jeanella Cara on: 02/20/2023 01:40 PM   Modules accepted: Orders

## 2023-02-20 NOTE — Progress Notes (Signed)
Chief Complaint: Right hip pain     History of Present Illness:    Rebecca Bonilla is a 47 y.o. female presents today with ongoing right heel pain without any specific injury.  She did recently start playing tennis which has flared up her injury.  She has been wearing new shoes.  She does have a history of high arches.  She has been taking NSAIDs as well as icing and elevating without any relief.  She is here today for further discussion    Surgical History:   None  PMH/PSH/Family History/Social History/Meds/Allergies:    Past Medical History:  Diagnosis Date   Alcohol abuse    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   Hyponatremia 08/15/2020   Macrocytosis 10/29/2020   Portal hypertension (HCC) 08/14/2020   Rotator cuff disorder    Past Surgical History:  Procedure Laterality Date   ANKLE SURGERY     x 2   BICEPT TENODESIS  04/30/2022   Procedure: BICEPS TENODESIS LEFT SHOULDER;  Surgeon: Huel Cote, MD;  Location: [facility];  Service: Orthopedics;;   CHEST TUBE INSERTION Left 06/27/2021   Procedure: CHEST TUBE INSERTION;  Surgeon: Tomma Lightning, MD;  Location: [facility];  Service: Pulmonary;  Laterality: Left;   PARACENTESIS     SHOULDER ARTHROSCOPY WITH SUBACROMIAL DECOMPRESSION Left 04/30/2022   Procedure: LEFT SHOULDER ARTHROSCOPY WITH SUBACROMIAL DECOMPRESSION;  Surgeon: Huel Cote, MD;  Location:  SUR[facility]ERY [facility];  Service: Orthopedics;  Laterality: Left;   THORACENTESIS N/A 06/18/2021   Procedure: Alanson Puls;  Surgeon: [name], MD;  Location: [facility] END[facility]COPY;  Service: Pulmonary;  Laterality: N/A;   Social History   Socioeconomic History   Marital status: Married    Spouse name: Not on file   Number of children: Not on file   Years of education: Not on file   Highest education level: Not on file  Occupational History   Not on file  Tobacco Use    Smoking status: Former    Current packs/day: 0.50    Average packs/day: 0.5 packs/day for 10.0 years (5.0 ttl pk-yrs)    Types: Cigarettes   Smokeless tobacco: Never  Vaping Use   Vaping status: Former  Substance and Sexual Activity   Alcohol use: Not Currently   Drug use: Never   Sexual activity: Yes    Birth control/protection: Post-menopausal  Other Topics Concern   Not on file  Social History Narrative   Not on file   Social Determinants of Health   Financial Resource Strain: Low Risk  (12/19/2020)   Received from [facility], [facility]   Overall Financial Resource Strain (CARDIA)    Difficulty of Paying Living Expenses: Not hard at all  Food Insecurity: No Food Insecurity (09/18/2021)   Received from [facility], [facility]   Hunger Vital Sign    Worried About Running Out of Food in the Last Year: Never true    Ran Out of Food in the Last Year: Never true  Transportation Needs: No Transportation Needs (12/19/2020)   Received from [facility], [facility]   PRAPARE - Transportation    Lack of Transportation (Medical): No    Lack of Transportation (Non-Medical): No  Physical Activity: Insufficiently Active (12/19/2020)   Received [facility], [facility]   Exercise  Vital Sign    Days of Exercise per Week: 3 days    Minutes of Exercise per Session: 30 min  Stress: No Stress Concern Present (12/19/2020)   Received from [facility], [facility] of Occupational Health - Occupational Stress Questionnaire    Feeling of Stress : Not at all  Social Connections: Unknown (11/09/2021)   Received from [facility], [facility]   Social Network    Social Network: Not on file   Family History  Problem Relation Age of Onset   Pancreatic cancer Paternal Grandfather    Skin cancer Father    Huntington's disease Mother    Allergies  Allergen Reactions   Clemastine Rash and Other (See Comments)    Tavist     Hydrocodone Nausea And  Vomiting   Ibuprofen Other (See Comments)    Intestinal reactions    Other Rash and Other (See Comments)    Albertson's Dayhist-D/generic of Tavist- (reaction to antihistamine that is no longer on the market)   Oxycodone Nausea Only and Other (See Comments)    Nausea to opioids     Current Outpatient Medications  Medication Sig Dispense Refill   busPIRone (BUSPAR) 5 MG tablet Take 1 tablet (5 mg total) by mouth 2 (two) times daily. May titrate up to 15 mg three times a day. Take the 1-2 weeks prior to menses. 60 tablet 6   cefdinir (OMNICEF) 300 MG capsule Take 1 capsule (300 mg total) by mouth 2 (two) times daily. 10 capsule 0   clindamycin (CLINDAGEL) 1 % gel Apply topically 2 (two) times daily. 30 g 11   furosemide (LASIX) 20 MG tablet TAKE 1 TABLET BY MOUTH EVERY DAY IN THE MORNING 30 tablet 0   Potassium Chloride ER 20 MEQ TBCR TAKE 1 TABLET (20 MEQ TOTAL) BY MOUTH EVERY MORNING 90 tablet 1   spironolactone (ALDACTONE) 100 MG tablet Take 1 tablet (100 mg total) by mouth daily. 30 tablet 3   TYLENOL CHILDRENS CHEWABLES 160 MG CHEW Chew 80 mg by mouth daily as needed (for headaches).     No current facility-administered medications for this visit.   No results found.  Review of Systems:   A ROS was performed including pertinent positives and negatives as documented in the HPI.  Physical Exam :   Constitutional: NAD and appears stated age Neurological: Alert and oriented Psych: Appropriate affect and cooperative There were no vitals taken for this visit.   Comprehensive Musculoskeletal Exam:    Tenderness to palpation about the right Achilles with some swelling at the mid body.  Strong active plantarflexion with a single leg raise.  Remainder of distal neurosensory exam is intact  Imaging:   Xray (3 view right ankle): Significant Haglund deformity    I personally reviewed and interpreted the radiographs.   Assessment:   47 y.o. 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  We did discuss treatment options at today's visit.  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 Shon Baton for assessment of this.  Plan :    -Plan for referral to Dr. Shon Baton for right Achilles shockwave therapy.     I personally saw and evaluated the patient, and participated in the management and treatment plan.  Huel Cote, MD Attending Physician, Orthopedic Surgery  This document was dictated using Conservation officer, historic buildings. A reasonable attempt at proof reading has been made to minimize errors.

## 2023-03-01 ENCOUNTER — Other Ambulatory Visit (HOSPITAL_BASED_OUTPATIENT_CLINIC_OR_DEPARTMENT_OTHER): Payer: Self-pay | Admitting: Family Medicine

## 2023-03-03 ENCOUNTER — Other Ambulatory Visit: Payer: Self-pay | Admitting: Obstetrics and Gynecology

## 2023-03-03 DIAGNOSIS — L732 Hidradenitis suppurativa: Secondary | ICD-10-CM

## 2023-03-03 MED ORDER — FUROSEMIDE 20 MG PO TABS: 20.0000 mg | ORAL_TABLET | Freq: Every day | ORAL | 0 refills | Status: DC

## 2023-03-06 ENCOUNTER — Ambulatory Visit: Payer: BC Managed Care – PPO | Admitting: Sports Medicine

## 2023-03-18 ENCOUNTER — Encounter: Payer: Self-pay | Admitting: Obstetrics and Gynecology

## 2023-03-20 ENCOUNTER — Telehealth: Payer: Self-pay | Admitting: *Deleted

## 2023-03-20 ENCOUNTER — Other Ambulatory Visit (HOSPITAL_BASED_OUTPATIENT_CLINIC_OR_DEPARTMENT_OTHER): Payer: Self-pay

## 2023-03-20 DIAGNOSIS — L732 Hidradenitis suppurativa: Secondary | ICD-10-CM

## 2023-03-20 MED ORDER — CLINDAMYCIN PHOSPHATE 1 % EX GEL
Freq: Two times a day (BID) | CUTANEOUS | 11 refills | Status: DC
  Filled 2023-03-20: qty 30, 30d supply, fill #0
  Filled 2023-04-07 – 2023-04-16 (×4): qty 30, 30d supply, fill #1
  Filled 2023-05-14: qty 30, 30d supply, fill #2
  Filled 2023-06-05: qty 30, 30d supply, fill #3
  Filled 2023-07-04 – 2023-07-05 (×2): qty 30, 30d supply, fill #4
  Filled 2023-07-22 – 2023-07-28 (×2): qty 30, 30d supply, fill #5
  Filled 2023-09-02: qty 30, 30d supply, fill #6
  Filled 2023-09-30: qty 30, 30d supply, fill #7

## 2023-03-20 NOTE — Telephone Encounter (Cosign Needed)
Pt called stating her pharmacy doesn't have Clindamycin gel and wants it sent to [facility].  RX sent

## 2023-04-08 ENCOUNTER — Other Ambulatory Visit: Payer: Self-pay

## 2023-04-10 ENCOUNTER — Other Ambulatory Visit (HOSPITAL_BASED_OUTPATIENT_CLINIC_OR_DEPARTMENT_OTHER): Payer: Self-pay

## 2023-04-14 ENCOUNTER — Other Ambulatory Visit (HOSPITAL_BASED_OUTPATIENT_CLINIC_OR_DEPARTMENT_OTHER): Payer: Self-pay

## 2023-04-16 ENCOUNTER — Other Ambulatory Visit (HOSPITAL_BASED_OUTPATIENT_CLINIC_OR_DEPARTMENT_OTHER): Payer: Self-pay

## 2023-04-16 ENCOUNTER — Other Ambulatory Visit (HOSPITAL_BASED_OUTPATIENT_CLINIC_OR_DEPARTMENT_OTHER): Payer: Self-pay | Admitting: *Deleted

## 2023-04-23 NOTE — Progress Notes (Unsigned)
HPI : seen [facility] in the past, history of EtOH cirrhosis with varices  Seen Dr. Anibal Henderson 11/2022:  47 y.o. female who presents for ongoing management of alcohol induced cirrhosis.   Patient has a past medical history of alcohol induced cirrhosis complicated by ascites, having needed 2 paracenteses, in January and March 2022.   Interval events  She denies hospitalizations for liver disease since last appointment. 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 She is also introducing a meditation practice through Buddhism. This is really helped her, she has had no cravings for alcohol.  Patient quit tobacco in January 2022, her last alcohol drink was July 22, 2020. She sees a substance abuse counselor in [facility]). Her sessions have been extended to 6 weeks. Denies cravings.  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 She has no melena or hematochezia. LE edema/ascites well controlled with Lasix 20 mg daily and spironolactone 100 mg daily. There is no jaundice or icterus.  Health Maintenance Variceal surveillance: EGD 11/30/2020, varices in the lower third of the esophagus, portal hypertensive gastropathy digestive health, [place]. Recommended repeat EGD - she would like to try and have this done in [place] or [place] CRC screening: Colonoscopy 11/30/2020, 10 mm polyp in the ascending colon, internal hemorrhoids digestive health, [facility] surveillance:09/2022 [place] negative for HCC. AFP normal today. Repeat 6 months Hepatitis A serology reactive Hepatitis B serology reactive Influenza: Recommended this occur late into each fall, scheduled Friday. Pneumonia: Prevnar 20 11/19/2022   Assessment & Plan: Ms. Sposito is a 47 y.o. female who presents today for ongoing management of  alcohol induced cirrhosis.  1. Ascites due to alcoholic cirrhosis (CMS/HHS-HCC)  2. Esophageal varices without bleeding, unspecified esophageal varices type (CMS/HHS-HCC)  3. Alcoholic cirrhosis of liver with ascites (CMS/HHS-HCC)  4. Portal hypertension (CMS/HHS-HCC)   Reanne has a history of alcohol induced cirrhosis with O- blood type, decompensated with large volume ascites and lower extremity edema who presents for follow up of cirrhosis. She was initially referred for liver transplant evaluation, but evaluation was closed given clinical improvement with alcohol abstinence and improved MELD score, MELD 3.0 was 17 today.  She is doing very well today and remains compensated from liver perspective. Remains abstinent from ETOH and is engaged in counseling. Volume is controlled on diuretics.  HCC surveillance is up-to-date. Ultrasound in March 2024 negative for [facility]. AFP is pending today. We will repeat ultrasound in 6 months to coincide with her next visit  I have recommended EGD for EV screening, she prefers locally. She had varices in 11/2020 locally in [place]. She will message me if she cannot get in locally.   Prevnar vaccine today.        Past Medical History:  Diagnosis Date   Alcohol abuse    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   Hyponatremia 08/15/2020   Macrocytosis 10/29/2020   Portal hypertension (HCC) 08/14/2020   Rotator cuff disorder      Past Surgical History:  Procedure Laterality Date   ANKLE SURGERY     x 2   BICEPT TENODESIS  04/30/2022   Procedure: BICEPS TENODESIS LEFT SHOULDER;  Surgeon: Huel Cote, MD;  Location: [facility];  Service: Orthopedics;;   CHEST TUBE INSERTION Left 06/27/2021   Procedure: CHEST TUBE INSERTION;  Surgeon:  Tomma Lightning, MD;  [facility]: [facility];  Service: Pulmonary;  Laterality: Left;   PARACENTESIS     SHOULDER ARTHROSCOPY WITH SUBACROMIAL DECOMPRESSION Left 04/30/2022    Procedure: LEFT SHOULDER ARTHROSCOPY WITH SUBACROMIAL DECOMPRESSION;  Surgeon: Huel Cote, MD;  Location: [facility];  Service: Orthopedics;  Laterality: Left;   THORACENTESIS N/A 06/18/2021   Procedure: Alanson Puls;  Surgeon: [name], MD;  Location: [facility] END[facility]COPY;  Service: Pulmonary;  Laterality: N/A;   Family History  Problem Relation Age of Onset   Pancreatic cancer Paternal Grandfather    Skin cancer Father    Huntington's disease Mother    Social History   Tobacco Use   Smoking status: Former    Current packs/day: 0.50    Average packs/day: 0.5 packs/day for 10.0 years (5.0 ttl pk-yrs)    Types: Cigarettes   Smokeless tobacco: Never  Vaping Use   Vaping status: Former  Substance Use Topics   Alcohol use: Not Currently   Drug use: Never   Current Outpatient Medications  Medication Sig Dispense Refill   busPIRone (BUSPAR) 5 MG tablet Take 1 tablet (5 mg total) by mouth 2 (two) times daily. May titrate up to 15 mg three times a day. Take the 1-2 weeks prior to menses. 60 tablet 6   cefdinir (OMNICEF) 300 MG capsule Take 1 capsule (300 mg total) by mouth 2 (two) times daily. 10 capsule 0   clindamycin (CLINDAGEL) 1 % gel Apply topically 2 (two) times daily. 30 g 11   furosemide (LASIX) 20 MG tablet Take 1 tablet (20 mg total) by mouth daily. 30 tablet 0   Potassium Chloride ER 20 MEQ TBCR TAKE 1 TABLET (20 MEQ TOTAL) BY MOUTH EVERY MORNING 90 tablet 1   spironolactone (ALDACTONE) 100 MG tablet Take 1 tablet (100 mg total) by mouth daily. 30 tablet 3   TYLENOL CHILDRENS CHEWABLES 160 MG CHEW Chew 80 mg by mouth daily as needed (for headaches).     No current facility-administered medications for this visit.   Allergies  Allergen Reactions   Clemastine Rash and Other (See Comments)    Tavist     Hydrocodone Nausea And Vomiting   Ibuprofen Other (See Comments)    Intestinal reactions    Other Rash and Other (See Comments)    Albertson's  Dayhist-D/generic of Tavist- (reaction to antihistamine that is no longer on the market)   Oxycodone Nausea Only and Other (See Comments)    Nausea to opioids       Review of Systems: All systems reviewed and negative except where noted in HPI.    No results found.  Physical Exam: There were no vitals taken for this visit. Constitutional: Pleasant,well-developed, ***female in no acute distress. HEENT: Normocephalic and atraumatic. Conjunctivae are normal. No scleral icterus. Neck supple.  Cardiovascular: Normal rate, regular rhythm.  Pulmonary/chest: Effort normal and breath sounds normal. No wheezing, rales or rhonchi. Abdominal: Soft, nondistended, nontender. Bowel sounds active throughout. There are no masses palpable. No hepatomegaly. Extremities: no edema Lymphadenopathy: No cervical adenopathy noted. Neurological: Alert and oriented to person place and time. Skin: Skin is warm and dry. No rashes noted. Psychiatric: Normal mood and affect. Behavior is normal.   ASSESSMENT: 47 y.o. female here for assessment of the following  No diagnosis found.  PLAN:   de [place], Buren Kos, MD

## 2023-04-24 ENCOUNTER — Encounter: Payer: Self-pay | Admitting: Gastroenterology

## 2023-04-24 ENCOUNTER — Ambulatory Visit: Payer: BC Managed Care – PPO | Admitting: Gastroenterology

## 2023-04-24 VITALS — BP 104/70 | HR 79 | Ht 68.0 in | Wt 190.0 lb

## 2023-04-24 DIAGNOSIS — Z860100 Personal history of colon polyps, unspecified: Secondary | ICD-10-CM

## 2023-04-24 DIAGNOSIS — K7031 Alcoholic cirrhosis of liver with ascites: Secondary | ICD-10-CM

## 2023-04-24 DIAGNOSIS — I8510 Secondary esophageal varices without bleeding: Secondary | ICD-10-CM | POA: Diagnosis not present

## 2023-04-24 NOTE — Patient Instructions (Addendum)
You have been scheduled for an endoscopy on 05-19-23 Please follow written instructions given to you at your visit today.  If you use inhalers (even only as needed), please bring them with you on the day of your procedure.  If you take any of the following medications, they will need to be adjusted prior to your procedure:   DO NOT TAKE 7 DAYS PRIOR TO TEST- Trulicity (dulaglutide) Ozempic, Wegovy (semaglutide) Mounjaro (tirzepatide) Bydureon Bcise (exanatide extended release)  DO NOT TAKE 1 DAY PRIOR TO YOUR TEST Rybelsus (semaglutide) Adlyxin (lixisenatide) Victoza (liraglutide) Byetta (exanatide) ____________________________________________________________  We will request your records from your last colonoscopy with [facility] in May of 2022 to confirm when you are next due.   Thank you for entrusting me with your care and for choosing [facility], Dr. Ileene Patrick

## 2023-04-28 ENCOUNTER — Ambulatory Visit: Payer: BC Managed Care – PPO | Admitting: Obstetrics and Gynecology

## 2023-04-28 ENCOUNTER — Encounter: Payer: Self-pay | Admitting: Obstetrics and Gynecology

## 2023-04-28 VITALS — BP 126/85 | HR 70 | Ht 68.0 in | Wt 191.0 lb

## 2023-04-28 DIAGNOSIS — N951 Menopausal and female climacteric states: Secondary | ICD-10-CM | POA: Diagnosis not present

## 2023-04-28 MED ORDER — FEZOLINETANT 45 MG PO TABS: 1.0000 | ORAL_TABLET | Freq: Every day | ORAL | 12 refills | Status: DC

## 2023-04-28 NOTE — Progress Notes (Signed)
GYNECOLOGY OFFICE VISIT NOTE  History:   Rebecca Bonilla is a 47 y.o. G1P0010 here today for perimenopause.   Discussed the use of AI scribe software for clinical note transcription with the patient, who gave verbal consent to proceed.  History of Present Illness   The patient, with a history of liver disease, presents with worsening perimenopause symptoms. She reports that the Buspar has been effective for managing her anxiety, but she continues to struggle with mood swings and significant joint pain, particularly in the ankles, knees, and hips. The joint pain is impacting her ability to exercise, which is a concern for maintaining her overall health and liver function. She has occasional hot flashes and night sweats, but no bleeding. She is considering dietary changes, such as cutting out sugar, to help manage her symptoms.   Past Medical History:  Diagnosis Date   Alcohol abuse    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   Hyponatremia 08/15/2020   Macrocytosis 10/29/2020   Portal hypertension (HCC) 08/14/2020   Rotator cuff disorder     Past Surgical History:  Procedure Laterality Date   ANKLE SURGERY     x 2   BICEPT TENODESIS  04/30/2022   Procedure: BICEPS TENODESIS LEFT SHOULDER;  Surgeon: Huel Cote, MD;  Location: [facility];  Service: Orthopedics;;   CHEST TUBE INSERTION Left 06/27/2021   Procedure: CHEST TUBE INSERTION;  Surgeon: Tomma Lightning, MD;  Location: [facility];  Service: Pulmonary;  Laterality: Left;   PARACENTESIS     SHOULDER ARTHROSCOPY WITH SUBACROMIAL DECOMPRESSION Left 04/30/2022   Procedure: LEFT SHOULDER ARTHROSCOPY WITH SUBACROMIAL DECOMPRESSION;  Surgeon: Huel Cote, MD;  Location: [facility];  Service: Orthopedics;  Laterality: Left;   THORACENTESIS N/A 06/18/2021   Procedure: Alanson Puls;  Surgeon: [name], MD;  Location: [facility];  Service: Pulmonary;  Laterality:  N/A;    The following portions of the patient's history were reviewed and updated as appropriate: allergies, current medications, past family history, past medical history, past social history, past surgical history and problem list.   Health Maintenance:   Normal pap and negative HRHPV:  Diagnosis  Date Value Ref Range Status  08/23/2021   Final   - Negative for intraepithelial lesion or malignancy (NILM)    Normal mammogram on 02/2023.   Review of Systems:  Pertinent items noted in HPI and remainder of comprehensive ROS otherwise negative.  Physical Exam:  BP 126/85   Pulse 70   Ht 5\' 8"  (1.727 m)   Wt 191 lb (86.6 kg)   BMI 29.04 kg/m  CONSTITUTIONAL: Well-developed, well-nourished female in no acute distress.  HEENT:  Normocephalic, atraumatic. External right and left ear normal. No scleral icterus.  NECK: Normal range of motion, supple, no masses noted on observation SKIN: No rash noted. Not diaphoretic. No erythema. No pallor. MUSCULOSKELETAL: Normal range of motion. No edema noted. NEUROLOGIC: Alert and oriented to person, place, and time. Normal muscle tone coordination. No cranial nerve deficit noted. PSYCHIATRIC: Normal mood and affect. Normal behavior. Normal judgment and thought content.  PELVIC: Deferred  Assessment and Plan:  Perimenopause Experiencing mood swings and significant joint pain, particularly in the lower extremities. Anxiety well-managed with Buspar. Discussed potential for hormone replacement therapy, but due to patient's history of liver disease, decided to consider a newer non-hormonal medication. -Research and prescribe new non-hormonal medication for perimenopause symptoms, pending insurance approval. -If new medication is not effective, consult with patient's liver doctor about  the possibility of low-dose transdermal hormonal therapy.  Liver Disease Stable with regular check-ups and ultrasound monitoring. Bilirubin slightly elevated. Patient is  mindful of medications and diet due to liver processing. -Continue current management and monitoring.  General Health Maintenance -Continue healthy lifestyle habits including diet and exercise. -Consider reducing sugar intake to potentially alleviate joint pain.       Meds ordered this encounter  Medications   Fezolinetant 45 MG TABS    Sig: Take 1 tablet (45 mg total) by mouth daily.    Dispense:  30 tablet    Refill:  12     Routine preventative health maintenance measures emphasized. Please refer to After Visit Summary for other counseling recommendations.   Return if symptoms worsen or fail to improve.  Milas Hock, MD, FACOG Obstetrician & Gynecologist, [facility] for [facility], [facility], [facility]

## 2023-04-28 NOTE — Patient Instructions (Signed)
[email or web address]  The medicine is not on your formulary - if you to go the [facility] website, you can get the first prescription free and then affordable monthly cost thereafter.

## 2023-04-30 ENCOUNTER — Other Ambulatory Visit (HOSPITAL_BASED_OUTPATIENT_CLINIC_OR_DEPARTMENT_OTHER): Payer: Self-pay | Admitting: Family Medicine

## 2023-05-02 ENCOUNTER — Encounter: Payer: Self-pay | Admitting: Gastroenterology

## 2023-05-02 ENCOUNTER — Encounter: Payer: Self-pay | Admitting: Obstetrics and Gynecology

## 2023-05-02 DIAGNOSIS — N951 Menopausal and female climacteric states: Secondary | ICD-10-CM

## 2023-05-05 MED ORDER — CLIMARA PRO 0.045-0.015 MG/DAY TD PTWK
1.0000 | MEDICATED_PATCH | TRANSDERMAL | 3 refills | Status: DC
Start: 2023-05-05 — End: 2023-06-26

## 2023-05-07 ENCOUNTER — Telehealth: Payer: Self-pay | Admitting: Gastroenterology

## 2023-05-07 NOTE — Telephone Encounter (Signed)
Results of prior EGD and colonoscopy arrived:  EGD 11/30/2020 - small varices, moderate portal hypertensive gastritis  Colonoscopy 11/30/2020 - 1cm ascending colon polyp - adenoma   Jan can you please place a recall for colonoscopy 11/2023.   Thanks

## 2023-05-08 NOTE — Telephone Encounter (Signed)
Recall colon in place for 11-2023

## 2023-05-14 ENCOUNTER — Other Ambulatory Visit (HOSPITAL_BASED_OUTPATIENT_CLINIC_OR_DEPARTMENT_OTHER): Payer: Self-pay

## 2023-05-19 ENCOUNTER — Encounter: Payer: BC Managed Care – PPO | Admitting: Gastroenterology

## 2023-05-19 ENCOUNTER — Other Ambulatory Visit (HOSPITAL_BASED_OUTPATIENT_CLINIC_OR_DEPARTMENT_OTHER): Payer: Self-pay

## 2023-05-19 MED ORDER — SPIRONOLACTONE 100 MG PO TABS
100.0000 mg | ORAL_TABLET | Freq: Every day | ORAL | 1 refills | Status: DC
  Filled 2023-06-01: qty 90, 90d supply, fill #0

## 2023-05-19 MED ORDER — CLINDAMYCIN PHOSPHATE 1 % EX GEL
Freq: Two times a day (BID) | CUTANEOUS | 11 refills | Status: DC
  Filled 2023-06-01: qty 30, fill #0

## 2023-05-19 MED FILL — Furosemide Tab 20 MG: ORAL | 90 days supply | Qty: 90 | Fill #0 | Status: CN

## 2023-05-19 NOTE — Telephone Encounter (Signed)
Good Morning Dr Adela Lank  Patient called to reschedule procedure for today at 2:00 pm due to her having Bronchitis. Patient rescheduled for 12/19.

## 2023-05-19 NOTE — Telephone Encounter (Signed)
Okay thanks for letting me know

## 2023-06-02 ENCOUNTER — Other Ambulatory Visit: Payer: Self-pay

## 2023-06-02 ENCOUNTER — Other Ambulatory Visit (HOSPITAL_BASED_OUTPATIENT_CLINIC_OR_DEPARTMENT_OTHER): Payer: Self-pay

## 2023-06-06 ENCOUNTER — Other Ambulatory Visit: Payer: Self-pay

## 2023-06-26 ENCOUNTER — Encounter: Payer: Self-pay | Admitting: Gastroenterology

## 2023-06-26 ENCOUNTER — Ambulatory Visit: Payer: BC Managed Care – PPO | Admitting: Gastroenterology

## 2023-06-26 VITALS — BP 105/75 | HR 88 | Temp 98.4°F | Resp 20 | Ht 68.0 in | Wt 190.0 lb

## 2023-06-26 DIAGNOSIS — I8510 Secondary esophageal varices without bleeding: Secondary | ICD-10-CM

## 2023-06-26 DIAGNOSIS — K3189 Other diseases of stomach and duodenum: Secondary | ICD-10-CM

## 2023-06-26 DIAGNOSIS — K449 Diaphragmatic hernia without obstruction or gangrene: Secondary | ICD-10-CM | POA: Diagnosis not present

## 2023-06-26 DIAGNOSIS — I8500 Esophageal varices without bleeding: Secondary | ICD-10-CM

## 2023-06-26 DIAGNOSIS — K766 Portal hypertension: Secondary | ICD-10-CM | POA: Diagnosis present

## 2023-06-26 DIAGNOSIS — K7031 Alcoholic cirrhosis of liver with ascites: Secondary | ICD-10-CM

## 2023-06-26 MED ORDER — SODIUM CHLORIDE 0.9 % IV SOLN: 500.0000 mL | Freq: Once | INTRAVENOUS | Status: DC

## 2023-06-26 NOTE — Patient Instructions (Signed)

## 2023-06-26 NOTE — Op Note (Signed)
[facility] Patient Name: Rebecca Bonilla Procedure Date: 06/26/2023 1:56 PM MRN: [id number] Endoscopist: Viviann Spare P. Adela Lank , MD, [id number] Age: 47 Referring MD:  Date of Birth: [birth year] Gender: Female Account #: [id number] Procedure:                Upper GI endoscopy Indications:              Cirrhosis rule out esophageal varices Medicines:                Monitored Anesthesia Care Procedure:                Pre-Anesthesia Assessment:                           - Prior to the procedure, a History and Physical                            was performed, and patient medications and                            allergies were reviewed. The patient's tolerance of                            previous anesthesia was also reviewed. The risks                            and benefits of the procedure and the sedation                            options and risks were discussed with the patient.                            All questions were answered, and informed consent                            was obtained. Prior Anticoagulants: The patient has                            taken no anticoagulant or antiplatelet agents. ASA                            Grade Assessment: III - A patient with severe                            systemic disease. After reviewing the risks and                            benefits, the patient was deemed in satisfactory                            condition to undergo the procedure.                           After obtaining informed consent, the endoscope was  passed under direct vision. Throughout the                            procedure, the patient's blood pressure, pulse, and                            oxygen saturations were monitored continuously. The                            GIF [id number] #[id number] was introduced through the                            mouth, and advanced to the second part of duodenum.                            The  upper GI endoscopy was accomplished without                            difficulty. The patient tolerated the procedure                            well. Scope In: Scope Out: Findings:                 Esophagogastric landmarks were identified: the                            Z-line was found at 38 cm, the gastroesophageal                            junction was found at 38 cm and the upper extent of                            the gastric folds was found at 39 cm from the                            incisors.                           A 1 cm hiatal hernia was present.                           Trace varices were found in the lower third of the                            esophagus. They completely flattened with                            insufflation. No stigmata for bleeding noted.                           The exam of the esophagus was otherwise normal.                           Mild portal hypertensive  gastropathy was found in                            the proximal stomach.                           The exam of the stomach was otherwise normal. No                            gastric varices.                           The examined duodenum was normal. Complications:            No immediate complications. Estimated blood loss:                            None. Estimated Blood Loss:     Estimated blood loss: none. Impression:               - Esophagogastric landmarks identified.                           - 1 cm hiatal hernia.                           - Trace varices that flattened with insufflation as                            outlined.                           - Normal esophagus otherwise.                           - Portal hypertensive gastropathy.                           - Normal stomach otherwise - no gastric varices                           - Normal examined duodenum.                           Overall, trace varices, no stigmata for bleeding,                            they are  low risk for bleeding at present time. Recommendation:           - Patient has a contact number available for                            emergencies. The signs and symptoms of potential                            delayed complications were discussed with the  patient. Return to normal activities tomorrow.                            Written discharge instructions were provided to the                            patient.                           - Resume previous diet.                           - Continue present medications.                           - Repeat EGD in 1-2 years for surveillance Viviann Spare P. Ark Agrusa, MD 06/26/2023 2:15:27 PM This report has been signed electronically.

## 2023-06-26 NOTE — Progress Notes (Signed)
[facility] History and Physical   Primary Care Physician:  de [place], Buren Kos, MD   Reason for Procedure:   Cirrhosis / history of varices  Plan:    EGD     HPI: Rebecca Bonilla is a 47 y.o. female  here for EGD to screen for esophageal varices. See office note 05/04/23 for details. Reported history of varices remotely, was on nadolol but had intolerance to it? Follows with [facility].  Otherwise feels well without any cardiopulmonary symptoms.   I have discussed risks / benefits of anesthesia and endoscopic procedure with [name] and they wish to proceed with the exams as outlined today.    Past Medical History:  Diagnosis Date   Alcohol abuse    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   Hyponatremia 08/15/2020   Macrocytosis 10/29/2020   Portal hypertension (HCC) 08/14/2020   Rotator cuff disorder     Past Surgical History:  Procedure Laterality Date   ANKLE SURGERY     x 2   BICEPT TENODESIS  04/30/2022   Procedure: BICEPS TENODESIS LEFT SHOULDER;  Surgeon: Huel Cote, MD;  Location: [facility];  Service: Orthopedics;;   CHEST TUBE INSERTION Left 06/27/2021   Procedure: CHEST TUBE INSERTION;  Surgeon: Tomma Lightning, MD;  Location: [facility];  Service: Pulmonary;  Laterality: Left;   PARACENTESIS     SHOULDER ARTHROSCOPY WITH SUBACROMIAL DECOMPRESSION Left 04/30/2022   Procedure: LEFT SHOULDER ARTHROSCOPY WITH SUBACROMIAL DECOMPRESSION;  Surgeon: Huel Cote, MD;  Location: [facility];  Service: Orthopedics;  Laterality: Left;   THORACENTESIS N/A 06/18/2021   Procedure: Alanson Puls;  Surgeon: [name], MD;  Location: [facility];  Service: Pulmonary;  Laterality: N/A;    Prior to Admission medications   Medication Sig Start Date End Date Taking? Authorizing Provider  busPIRone (BUSPAR) 5 MG tablet Take 1 tablet (5 mg total) by mouth 2 (two) times daily.  May titrate up to 15 mg three times a day. Take the 1-2 weeks prior to menses. 11/18/22  Yes Milas Hock, MD  Cholecalciferol (VITAMIN D-1000 MAX ST) 25 MCG (1000 UT) tablet Take by mouth.   Yes [provider]  clindamycin (CLINDAGEL) 1 % gel Apply topically 2 (two) times daily. 03/20/23  Yes Milas Hock, MD  furosemide (LASIX) 20 MG tablet TAKE 1 TABLET BY MOUTH EVERY DAY 04/30/23  Yes de [place], Raymond J, MD  Potassium Chloride ER 20 MEQ TBCR TAKE 1 TABLET (20 MEQ TOTAL) BY MOUTH EVERY MORNING 01/29/23  Yes de [place], Raymond J, MD  spironolactone (ALDACTONE) 100 MG tablet Take 1 tablet (100 mg total) by mouth daily. 09/01/22  Yes   TYLENOL CHILDRENS CHEWABLES 160 MG CHEW Chew 80 mg by mouth daily as needed (for headaches).   Yes [provider]    Current Outpatient Medications  Medication Sig Dispense Refill   busPIRone (BUSPAR) 5 MG tablet Take 1 tablet (5 mg total) by mouth 2 (two) times daily. May titrate up to 15 mg three times a day. Take the 1-2 weeks prior to menses. 180 tablet 6   Cholecalciferol (VITAMIN D-1000 MAX ST) 25 MCG (1000 UT) tablet Take by mouth.     clindamycin (CLINDAGEL) 1 % gel Apply topically 2 (two) times daily. 30 g 11   furosemide (LASIX) 20 MG tablet TAKE 1 TABLET BY MOUTH EVERY DAY 90 tablet 1   Potassium Chloride ER 20 MEQ TBCR TAKE 1 TABLET (20 MEQ TOTAL) BY  MOUTH EVERY MORNING 90 tablet 1   spironolactone (ALDACTONE) 100 MG tablet Take 1 tablet (100 mg total) by mouth daily. 90 tablet 1   TYLENOL CHILDRENS CHEWABLES 160 MG CHEW Chew 80 mg by mouth daily as needed (for headaches).     Current Facility-Administered Medications  Medication Dose Route Frequency Provider Last Rate Last Admin   0.9 %  sodium chloride infusion  500 mL Intravenous Once Kaylynn Chamblin, Willaim Rayas, MD        Allergies as of 06/26/2023 - Review Complete 06/26/2023  Allergen Reaction Noted   Clemastine Rash and Other (See Comments) 05/25/2012   Hydrocodone Nausea And  Vomiting 10/29/2020   Ibuprofen Other (See Comments) 04/30/2022   Other Rash and Other (See Comments) 10/29/2020   Oxycodone Nausea Only and Other (See Comments) 09/08/2020    Family History  Problem Relation Age of Onset   Huntington's disease Mother    Skin cancer Father    Pancreatic cancer Paternal Grandfather    Colon cancer Neg Hx    Stomach cancer Neg Hx    Esophageal cancer Neg Hx    Colon polyps Neg Hx    Rectal cancer Neg Hx     Social History   Socioeconomic History   Marital status: Married    Spouse name: Not on file   Number of children: 0   Years of education: Not on file   Highest education level: Not on file  Occupational History   Occupation: [employer]  Tobacco Use   Smoking status: Former    Current packs/day: 0.50    Average packs/day: 0.5 packs/day for 10.0 years (5.0 ttl pk-yrs)    Types: Cigarettes   Smokeless tobacco: Never  Vaping Use   Vaping status: Former   Substances: Nicotine  Substance and Sexual Activity   Alcohol use: Not Currently   Drug use: Never   Sexual activity: Yes    Birth control/protection: Post-menopausal  Other Topics Concern   Not on file  Social History Narrative   Not on file   Social Drivers of Health   Financial Resource Strain: Low Risk  (12/19/2020)   Received from [facility], [facility]   Overall Financial Resource Strain (CARDIA)    Difficulty of Paying Living Expenses: Not hard at all  Food Insecurity: No Food Insecurity (09/18/2021)   Received from [facility], [facility]   Hunger Vital Sign    Worried About Running Out of Food in the Last Year: Never true    Ran Out of Food in the Last Year: Never true  Transportation Needs: No Transportation Needs (12/19/2020)   Received from [facility], [facility]   PRAPARE - Transportation    Lack of Transportation (Medical): No    Lack of Transportation (Non-Medical): No  Physical Activity: Insufficiently Active (12/19/2020)   Received from [facility], [facility]   Exercise Vital Sign    Days of Exercise per Week: 3 days    Minutes of Exercise per Session: 30 min  Stress: No Stress Concern Present (12/19/2020)   Received from [facility], [facility] - Occupational Stress Questionnaire    Feeling of Stress : Not at all  Social Connections: Unknown (11/09/2021)   Received from [facility], [facility]   Social Network    Social Network: Not on file  Intimate Partner Violence: Unknown (10/09/2021)   Received from [facility], [facility]   HITS    Physically Hurt: Not on  file    Insult or Talk Down To: Not on file    Threaten Physical Harm: Not on file    Scream or Curse: Not on file    Review of Systems: All other review of systems negative except as mentioned in the HPI.  Physical Exam: Vital signs BP 118/76   Pulse 78   Temp 98.4 F (36.9 C) (Temporal)   Ht 5\' 8"  (1.727 m)   Wt 190 lb (86.2 kg)   SpO2 100%   BMI 28.89 kg/m   General:   Alert,  Well-developed, pleasant and cooperative in NAD Lungs:  Clear throughout to auscultation.   Heart:  Regular rate and rhythm Abdomen:  Soft, nontender and nondistended.   Neuro/Psych:  Alert and cooperative. Normal mood and affect. A and O x 3  Harlin Rain, MD [facility]

## 2023-06-27 ENCOUNTER — Telehealth: Payer: Self-pay | Admitting: *Deleted

## 2023-06-27 NOTE — Telephone Encounter (Signed)
  Follow up Call-     06/26/2023    1:18 PM  Call back number  Post procedure Call Back phone  # [phone]  Permission to leave phone message Yes     Patient questions:  Do you have a fever, pain , or abdominal swelling? No. Pain Score  0 *  Have you tolerated food without any problems? Yes.    Have you been able to return to your normal activities? Yes.    Do you have any questions about your discharge instructions: Diet   No. Medications  No. Follow up visit  No.  Do you have questions or concerns about your Care? No.  Actions: * If pain score is 4 or above: No action needed, pain <4.

## 2023-07-02 ENCOUNTER — Telehealth: Payer: BC Managed Care – PPO | Admitting: Family Medicine

## 2023-07-02 DIAGNOSIS — H1031 Unspecified acute conjunctivitis, right eye: Secondary | ICD-10-CM | POA: Diagnosis not present

## 2023-07-02 MED ORDER — POLYMYXIN B-TRIMETHOPRIM 10000-0.1 UNIT/ML-% OP SOLN: 1.0000 [drp] | OPHTHALMIC | 0 refills | Status: DC

## 2023-07-02 NOTE — Progress Notes (Signed)
Virtual Visit Consent   Rebecca Bonilla, you are scheduled for a virtual visit with a [facility] provider today. Just as with appointments in the office, your consent must be obtained to participate. Your consent will be active for this visit and any virtual visit you may have with one of our providers in the next 365 days. If you have a MyChart account, a copy of this consent can be sent to you electronically.  As this is a virtual visit, video technology does not allow for your provider to perform a traditional examination. This may limit your provider's ability to fully assess your condition. If your provider identifies any concerns that need to be evaluated in person or the need to arrange testing (such as labs, EKG, etc.), we will make arrangements to do so. Although advances in technology are sophisticated, we cannot ensure that it will always work on either your end or our end. If the connection with a video visit is poor, the visit may have to be switched to a telephone visit. With either a video or telephone visit, we are not always able to ensure that we have a secure connection.  By engaging in this virtual visit, you consent to the provision of healthcare and authorize for your insurance to be billed (if applicable) for the services provided during this visit. Depending on your insurance coverage, you may receive a charge related to this service.  I need to obtain your verbal consent now. Are you willing to proceed with your visit today? Rebecca Bonilla has provided verbal consent on 07/02/2023 for a virtual visit (video or telephone). Reed Pandy, [place]  Date: 07/02/2023 10:30 AM  Virtual Visit via Video Note   I, Reed Pandy, connected with  Rebecca Bonilla  ([id number], 09/04/1975) on 07/02/23 at 10:15 AM EST by a video-enabled telemedicine application and verified that I am speaking with the correct person using two identifiers.  Location: Patient:  Virtual Visit Location Patient: Home Provider: Virtual Visit Location Provider: Home Office   I discussed the limitations of evaluation and management by telemedicine and the availability of in person appointments. The patient expressed understanding and agreed to proceed.    History of Present Illness: Rebecca Bonilla is a 47 y.o. who identifies as a female who was assigned female at birth, and is being seen today for c/o of an eye infection in right eye.  Pt states woke up with her right eye swollen shut.  Pt sates she had some crusty discharge when she woke up. Pt denies fever or chills.   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   Recurrent left pleural effusion 06/26/2021   Flank pain 06/09/2021   Hematuria 06/09/2021   Ascites 04/02/2021   Secondary esophageal varices without bleeding (HCC) 12/19/2020   History of colon polyps 12/19/2020   SOB (shortness of breath) 10/30/2020   Pleural effusion 10/29/2020   Macrocytosis 10/29/2020   Hypokalemia 10/29/2020   Hypophosphatemia 10/29/2020   Hypomagnesemia 10/29/2020   Leukocytosis 10/12/2020   Thrombocytopenia (HCC) 10/12/2020   Anemia, chronic disease 09/20/2020   Alcoholism (HCC) 08/25/2020   Hyponatremia 08/15/2020   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   Portal hypertension (HCC) 08/14/2020   Alcoholic cirrhosis of liver with ascites (HCC) 08/14/2020   Gallbladder sludge  08/14/2020   Psoriasis 08/09/2020    Allergies:  Allergies  Allergen Reactions   Clemastine Rash and Other (See Comments)    Tavist     Hydrocodone Nausea And Vomiting   Ibuprofen Other (See Comments)    Intestinal reactions    Other Rash and Other (See Comments)    Albertson's Dayhist-D/generic of Tavist- (reaction to antihistamine that is no longer on  the market)   Oxycodone Nausea Only and Other (See Comments)    Nausea to opioids     Medications:  Current Outpatient Medications:    trimethoprim-polymyxin b (POLYTRIM) ophthalmic solution, Place 1 drop into the right eye every 4 (four) hours for 5 days., Disp: 10 mL, Rfl: 0   busPIRone (BUSPAR) 5 MG tablet, Take 1 tablet (5 mg total) by mouth 2 (two) times daily. May titrate up to 15 mg three times a day. Take the 1-2 weeks prior to menses., Disp: 180 tablet, Rfl: 6   Cholecalciferol (VITAMIN D-1000 MAX ST) 25 MCG (1000 UT) tablet, Take by mouth., Disp: , Rfl:    clindamycin (CLINDAGEL) 1 % gel, Apply topically 2 (two) times daily., Disp: 30 g, Rfl: 11   furosemide (LASIX) 20 MG tablet, TAKE 1 TABLET BY MOUTH EVERY DAY, Disp: 90 tablet, Rfl: 1   Potassium Chloride ER 20 MEQ TBCR, TAKE 1 TABLET (20 MEQ TOTAL) BY MOUTH EVERY MORNING, Disp: 90 tablet, Rfl: 1   spironolactone (ALDACTONE) 100 MG tablet, Take 1 tablet (100 mg total) by mouth daily., Disp: 90 tablet, Rfl: 1   TYLENOL CHILDRENS CHEWABLES 160 MG CHEW, Chew 80 mg by mouth daily as needed (for headaches)., Disp: , Rfl:   Observations/Objective: Patient is well-developed, well-nourished in no acute distress.  Resting comfortably at home.  Head is normocephalic, atraumatic. Erythematous right eye noted  No labored breathing.  Speech is clear and coherent with logical content.  Patient is alert and oriented at baseline.    Assessment and Plan: 1. Acute conjunctivitis of right eye, unspecified acute conjunctivitis type (Primary) - trimethoprim-polymyxin b (POLYTRIM) ophthalmic solution; Place 1 drop into the right eye every 4 (four) hours for 5 days.  Dispense: 10 mL; Refill: 0  -Pt to F/U with PCP if symptoms persist or worsen   Follow Up Instructions: I discussed the assessment and treatment plan with the patient. The patient was provided an opportunity to ask questions and all were answered. The patient agreed with the plan  and demonstrated an understanding of the instructions.  A copy of instructions were sent to the patient via MyChart unless otherwise noted below.    The patient was advised to call back or seek an in-person evaluation if the symptoms worsen or if the condition fails to improve as anticipated.    Reed Pandy, PA-C

## 2023-07-02 NOTE — Patient Instructions (Signed)
Rebecca Bonilla, thank you for joining Reed Pandy, PA-C for today's virtual visit.  While this provider is not your primary care provider (PCP), if your PCP is located in our provider database this encounter information will be shared with them immediately following your visit.   A [facility] MyChart account gives you access to today's visit and all your visits, tests, and labs performed at [facility] " click here if you don't have a [facility] MyChart account or go to [email or web address]  Consent: (Patient) Rebecca Bonilla provided verbal consent for this virtual visit at the beginning of the encounter.  Current Medications:  Current Outpatient Medications:    trimethoprim-polymyxin b (POLYTRIM) ophthalmic solution, Place 1 drop into the right eye every 4 (four) hours for 5 days., Disp: 10 mL, Rfl: 0   busPIRone (BUSPAR) 5 MG tablet, Take 1 tablet (5 mg total) by mouth 2 (two) times daily. May titrate up to 15 mg three times a day. Take the 1-2 weeks prior to menses., Disp: 180 tablet, Rfl: 6   Cholecalciferol (VITAMIN D-1000 MAX ST) 25 MCG (1000 UT) tablet, Take by mouth., Disp: , Rfl:    clindamycin (CLINDAGEL) 1 % gel, Apply topically 2 (two) times daily., Disp: 30 g, Rfl: 11   furosemide (LASIX) 20 MG tablet, TAKE 1 TABLET BY MOUTH EVERY DAY, Disp: 90 tablet, Rfl: 1   Potassium Chloride ER 20 MEQ TBCR, TAKE 1 TABLET (20 MEQ TOTAL) BY MOUTH EVERY MORNING, Disp: 90 tablet, Rfl: 1   spironolactone (ALDACTONE) 100 MG tablet, Take 1 tablet (100 mg total) by mouth daily., Disp: 90 tablet, Rfl: 1   TYLENOL CHILDRENS CHEWABLES 160 MG CHEW, Chew 80 mg by mouth daily as needed (for headaches)., Disp: , Rfl:    Medications ordered in this encounter:  Meds ordered this encounter  Medications   trimethoprim-polymyxin b (POLYTRIM) ophthalmic solution    Sig: Place 1 drop into the right eye every 4 (four) hours for 5 days.    Dispense:  10 mL    Refill:  0      *If you need refills on other medications prior to your next appointment, please contact your pharmacy*  Follow-Up: Call back or seek an in-person evaluation if the symptoms worsen or if the condition fails to improve as anticipated.  [facility] [phone]  Other Instructions Bacterial Conjunctivitis, Adult Bacterial conjunctivitis is an infection of the clear membrane that covers the white part of the eye and the inner surface of the eyelid (conjunctiva). When the blood vessels in the conjunctiva become inflamed, the eye becomes red or pink. The eye often feels irritated or itchy. Bacterial conjunctivitis spreads easily from person to person (is contagious). It also spreads easily from one eye to the other eye. What are the causes? This condition is caused by bacteria. You may get the infection if you come into close contact with: A person who is infected with the bacteria. Items that are contaminated with the bacteria, such as a face towel, contact lens solution, or eye makeup. What increases the risk? You are more likely to develop this condition if: You are exposed to other people who have the infection. You wear contact lenses. You have a sinus infection. You have had a recent eye injury or surgery. You have a weak body defense system (immune system). You have a medical condition that causes dry eyes. What are the signs or symptoms? Symptoms of this condition include: Thick, yellowish discharge from  the eye. This may turn into a crust on the eyelid overnight and cause your eyelids to stick together. Tearing or watery eyes. Itchy eyes. Burning feeling in your eyes. Eye redness. Swollen eyelids. Blurred vision. How is this diagnosed? This condition is diagnosed based on your symptoms and medical history. Your health care provider may also take a sample of discharge from your eye to find the cause of your infection. How is this treated? This condition may be  treated with: Antibiotic eye drops or ointment to clear the infection more quickly and prevent the spread of infection to others. Antibiotic medicines taken by mouth (orally) to treat infections that do not respond to drops or ointments or that last longer than 10 days. Cool, wet cloths (cool compresses) placed on the eyes. Artificial tears applied 2-6 times a day. Follow these instructions at home: Medicines Take or apply your antibiotic medicine as told by your health care provider. Do not stop using the antibiotic, even if your condition improves, unless directed by your health care provider. Take or apply over-the-counter and prescription medicines only as told by your health care provider. Be very careful to avoid touching the edge of your eyelid with the eye-drop bottle or the ointment tube when you apply medicines to the affected eye. This will keep you from spreading the infection to your other eye or to other people. Managing discomfort Gently wipe away any drainage from your eye with a warm, wet washcloth or a cotton ball. Apply a clean, cool compress to your eye for 10-20 minutes, 3-4 times a day. General instructions Do not wear contact lenses until the inflammation is gone and your health care provider says it is safe to wear them again. Ask your health care provider how to sterilize or replace your contact lenses before you use them again. Wear glasses until you can resume wearing contact lenses. Avoid wearing eye makeup until the inflammation is gone. Throw away any old eye cosmetics that may be contaminated. Change or wash your pillowcase every day. Do not share towels or washcloths. This may spread the infection. Wash your hands often with soap and water for at least 20 seconds and especially before touching your face or eyes. Use paper towels to dry your hands. Avoid touching or rubbing your eyes. Do not drive or use heavy machinery if your vision is blurred. Contact a health  care provider if: You have a fever. Your symptoms do not get better after 10 days. Get help right away if: You have a fever and your symptoms suddenly get worse. You have severe pain when you move your eye. You have facial pain, redness, or swelling. You have a sudden loss of vision. Summary Bacterial conjunctivitis is an infection of the clear membrane that covers the white part of the eye and the inner surface of the eyelid (conjunctiva). Bacterial conjunctivitis spreads easily from eye to eye and from person to person (is contagious). Wash your hands often with soap and water for at least 20 seconds and especially before touching your face or eyes. Use paper towels to dry your hands. Take or apply your antibiotic medicine as told by your health care provider. Do not stop using the antibiotic even if your condition improves. Contact a health care provider if you have a fever or if your symptoms do not get better after 10 days. Get help right away if you have a sudden loss of vision. This information is not intended to replace advice given to  you by your health care provider. Make sure you discuss any questions you have with your health care provider. Document Revised: 10/04/2020 Document Reviewed: 10/04/2020 Elsevier Patient Education  2024 Elsevier Inc.    If you have been instructed to have an in-person evaluation today at a local Urgent Care facility, please use the link below. It will take you to a list of all of our available [facility], including address, phone number and hours of operation. Please do not delay care.  [facility]  If you or a family member do not have a primary care provider, use the link below to schedule a visit and establish care. When you choose a [facility] primary care physician or advanced practice provider, you gain a long-term partner in health. Find a Primary Care Provider  Learn more about [facility] in-office and virtual  care options: [facility] - Get Care Now

## 2023-07-04 MED FILL — Potassium Chloride Tab ER 20 mEq (1500 MG): ORAL | 90 days supply | Qty: 90 | Fill #0 | Status: CN

## 2023-07-05 ENCOUNTER — Other Ambulatory Visit (HOSPITAL_BASED_OUTPATIENT_CLINIC_OR_DEPARTMENT_OTHER): Payer: Self-pay

## 2023-07-05 MED FILL — Potassium Chloride Tab ER 20 mEq (1500 MG): ORAL | 90 days supply | Qty: 90 | Fill #0 | Status: AC

## 2023-07-07 ENCOUNTER — Other Ambulatory Visit: Payer: Self-pay

## 2023-07-07 ENCOUNTER — Other Ambulatory Visit (HOSPITAL_BASED_OUTPATIENT_CLINIC_OR_DEPARTMENT_OTHER): Payer: Self-pay

## 2023-07-07 ENCOUNTER — Encounter (HOSPITAL_BASED_OUTPATIENT_CLINIC_OR_DEPARTMENT_OTHER): Payer: Self-pay | Admitting: Family Medicine

## 2023-07-07 DIAGNOSIS — H1031 Unspecified acute conjunctivitis, right eye: Secondary | ICD-10-CM

## 2023-07-07 MED ORDER — POLYMYXIN B-TRIMETHOPRIM 10000-0.1 UNIT/ML-% OP SOLN: 1.0000 [drp] | OPHTHALMIC | 0 refills | Status: AC

## 2023-07-21 ENCOUNTER — Encounter (HOSPITAL_BASED_OUTPATIENT_CLINIC_OR_DEPARTMENT_OTHER): Payer: Self-pay | Admitting: Family Medicine

## 2023-07-21 DIAGNOSIS — D696 Thrombocytopenia, unspecified: Secondary | ICD-10-CM

## 2023-07-23 ENCOUNTER — Other Ambulatory Visit (HOSPITAL_BASED_OUTPATIENT_CLINIC_OR_DEPARTMENT_OTHER): Payer: Self-pay

## 2023-07-28 ENCOUNTER — Other Ambulatory Visit: Payer: Self-pay

## 2023-07-28 ENCOUNTER — Encounter (HOSPITAL_BASED_OUTPATIENT_CLINIC_OR_DEPARTMENT_OTHER): Payer: Self-pay | Admitting: Family Medicine

## 2023-07-28 ENCOUNTER — Other Ambulatory Visit (HOSPITAL_BASED_OUTPATIENT_CLINIC_OR_DEPARTMENT_OTHER): Payer: Self-pay

## 2023-07-28 ENCOUNTER — Other Ambulatory Visit (HOSPITAL_BASED_OUTPATIENT_CLINIC_OR_DEPARTMENT_OTHER): Payer: Self-pay | Admitting: Family Medicine

## 2023-07-28 ENCOUNTER — Ambulatory Visit (HOSPITAL_BASED_OUTPATIENT_CLINIC_OR_DEPARTMENT_OTHER): Payer: 59 | Admitting: Family Medicine

## 2023-07-28 VITALS — BP 120/77 | HR 70 | Temp 98.1°F | Ht 68.0 in | Wt 191.7 lb

## 2023-07-28 DIAGNOSIS — D696 Thrombocytopenia, unspecified: Secondary | ICD-10-CM | POA: Diagnosis not present

## 2023-07-28 DIAGNOSIS — K7031 Alcoholic cirrhosis of liver with ascites: Secondary | ICD-10-CM | POA: Diagnosis not present

## 2023-07-28 DIAGNOSIS — N898 Other specified noninflammatory disorders of vagina: Secondary | ICD-10-CM | POA: Insufficient documentation

## 2023-07-28 LAB — CBC WITH DIFFERENTIAL/PLATELET
Basophils Absolute: 0 10*3/uL (ref 0.0–0.2)
Basos: 1 %
EOS (ABSOLUTE): 0.2 10*3/uL (ref 0.0–0.4)
Eos: 3 %
Hematocrit: 46.1 % (ref 34.0–46.6)
Hemoglobin: 16 g/dL — ABNORMAL HIGH (ref 11.1–15.9)
Immature Grans (Abs): 0 10*3/uL (ref 0.0–0.1)
Immature Granulocytes: 0 %
Lymphocytes Absolute: 1.8 10*3/uL (ref 0.7–3.1)
Lymphs: 28 %
MCH: 32.4 pg (ref 26.6–33.0)
MCHC: 34.7 g/dL (ref 31.5–35.7)
MCV: 93 fL (ref 79–97)
Monocytes Absolute: 0.6 10*3/uL (ref 0.1–0.9)
Monocytes: 9 %
Neutrophils Absolute: 3.7 10*3/uL (ref 1.4–7.0)
Neutrophils: 59 %
Platelets: 118 10*3/uL — ABNORMAL LOW (ref 150–450)
RBC: 4.94 x10E6/uL (ref 3.77–5.28)
RDW: 13 % (ref 11.7–15.4)
WBC: 6.2 10*3/uL (ref 3.4–10.8)

## 2023-07-28 MED ORDER — POTASSIUM CHLORIDE ER 20 MEQ PO TBCR
20.0000 meq | EXTENDED_RELEASE_TABLET | Freq: Every morning | ORAL | 1 refills | Status: DC
  Filled 2023-07-28 – 2023-09-30 (×2): qty 90, 90d supply, fill #0
  Filled 2024-01-18: qty 90, 90d supply, fill #1

## 2023-07-28 MED FILL — Furosemide Tab 20 MG: ORAL | 90 days supply | Qty: 90 | Fill #0 | Status: AC

## 2023-07-28 NOTE — Progress Notes (Signed)
Subjective:   Rebecca Bonilla 05/01/1976 07/28/2023  Chief Complaint  Patient presents with   Medical Management of Chronic Issues    Patient is following up for a general checkup. States she is having vaginal dryness that she wants to discuss.    HPI: Rebecca Bonilla presents today for re-assessment and management of chronic medical conditions.  THROMBOCYTOPENIA, CIRRHOSIS:   Patient is followed by [facility] and [facility] for chronic cirrhosis, esophageal varices and previous alcohol use. She is up to date on EGD and was recommended repeat cscope in May 2025 which she plans to schedule. Patient denies alcohol use, bleeding from gums, blood in stool or dark black stools. Patients most recent platelet count with [facility] was 110 in November 2024. HGB 15.5, Hct 44.2. She is scheduled for follow up with [facility] in June 2025.   LFTs- November 2024:  AST 44 ALT 27 Bilirubin Total: 3.4  Alk Phos: 91     MENOPAUSAL SYMPTOMS Hot flashes: no Night sweats: no Sleep disturbances: no Vaginal dryness:  Yes, patient reporting vaginal irritation causing small amounts of bleeding at times.  Dyspareunia:yes Decreased libido: no Emotional lability: no  Previous HRT/pharmacotherapy: Clemara patch in the past- could not tolerate side effects     The following portions of the patient's history were reviewed and updated as appropriate: past medical history, past surgical history, family history, social history, allergies, medications, and problem list.   Patient Active Problem List   Diagnosis Date Noted   Vaginal dryness 07/28/2023   Wellness examination 01/30/2023   Left ear pain 01/30/2023   Nontraumatic incomplete tear of left rotator cuff    Flank pain 06/09/2021   Hematuria 06/09/2021   Ascites 04/02/2021   Secondary esophageal varices without bleeding (HCC) 12/19/2020   History of colon polyps 12/19/2020   Hypokalemia 10/29/2020   Hypophosphatemia  10/29/2020   Hypomagnesemia 10/29/2020   Leukocytosis 10/12/2020   Thrombocytopenia (HCC) 10/12/2020   Alcoholism (HCC) 08/25/2020   Hyponatremia 08/15/2020   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   Portal hypertension (HCC) 08/14/2020   Alcoholic cirrhosis of liver with ascites (HCC) 08/14/2020   Gallbladder sludge 08/14/2020   Psoriasis 08/09/2020   Past Medical History:  Diagnosis Date   Alcohol abuse    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   Hyponatremia 08/15/2020   Macrocytosis 10/29/2020   Portal hypertension (HCC) 08/14/2020   Rotator cuff disorder    Past Surgical History:  Procedure Laterality Date   ANKLE SURGERY     x 2   BICEPT TENODESIS  04/30/2022   Procedure: BICEPS TENODESIS LEFT SHOULDER;  Surgeon: Huel Cote, MD;  Location: [facility];  Service: Orthopedics;;   CHEST TUBE INSERTION Left 06/27/2021   Procedure: CHEST TUBE INSERTION;  Surgeon: Tomma Lightning, MD;  Location: [facility];  Service: Pulmonary;  Laterality: Left;   PARACENTESIS     SHOULDER ARTHROSCOPY WITH SUBACROMIAL DECOMPRESSION Left 04/30/2022   Procedure: LEFT SHOULDER ARTHROSCOPY WITH SUBACROMIAL DECOMPRESSION;  Surgeon: Huel Cote, MD;  Location: [facility];  Service: Orthopedics;  Laterality: Left;   THORACENTESIS N/A 06/18/2021   Procedure: Alanson Puls;  Surgeon: [name], MD;  Location: [facility];  Service: Pulmonary;  Laterality: N/A;   Family History  Problem Relation Age of Onset   Huntington's disease Mother    Skin cancer Father    Pancreatic cancer Paternal Grandfather    Colon cancer Neg Hx  Stomach cancer Neg Hx    Esophageal cancer Neg Hx    Colon polyps Neg Hx    Rectal cancer Neg Hx    Outpatient Medications Prior to Visit  Medication Sig Dispense Refill   busPIRone (BUSPAR) 5 MG tablet Take 1 tablet (5 mg total) by mouth 2 (two) times daily. May titrate up to 15 mg three times a day.  Take the 1-2 weeks prior to menses. 180 tablet 6   Cholecalciferol (VITAMIN D-1000 MAX ST) 25 MCG (1000 UT) tablet Take by mouth.     clindamycin (CLINDAGEL) 1 % gel Apply topically 2 (two) times daily. 30 g 11   furosemide (LASIX) 20 MG tablet TAKE 1 TABLET BY MOUTH EVERY DAY 90 tablet 1   Potassium Chloride ER 20 MEQ TBCR TAKE 1 TABLET (20 MEQ TOTAL) BY MOUTH EVERY MORNING 90 tablet 1   spironolactone (ALDACTONE) 100 MG tablet Take 1 tablet (100 mg total) by mouth daily. 90 tablet 1   triamcinolone cream (KENALOG) 0.5 % Apply 1 Application topically as needed (psoriasis).     TYLENOL CHILDRENS CHEWABLES 160 MG CHEW Chew 80 mg by mouth daily as needed (for headaches).     No facility-administered medications prior to visit.   Allergies  Allergen Reactions   Hydrocodone Nausea And Vomiting   Ibuprofen Other (See Comments)    Intestinal reactions    Other Rash and Other (See Comments)    Albertson's Dayhist-D/generic of Tavist- (reaction to antihistamine that is no longer on the market)   Oxycodone Nausea Only and Other (See Comments)    Nausea to opioids       ROS: A complete ROS was performed with pertinent positives/negatives noted in the HPI. The remainder of the ROS are negative.    Objective:   Today's Vitals   07/28/23 0808  BP: 120/77  Pulse: 70  Temp: 98.1 F (36.7 C)  TempSrc: Oral  SpO2: 100%  Weight: 191 lb 11.2 oz (87 kg)  Height: 5\' 8"  (1.727 m)    Physical Exam          GENERAL: Well-appearing, in NAD. Well nourished.  SKIN: Pink, warm and dry. No rash, lesion, ulceration, or ecchymoses.  Head: Normocephalic. NECK: Trachea midline. Full ROM w/o pain or tenderness.  RESPIRATORY: Chest wall symmetrical. Respirations even and non-labored. Breath sounds clear to auscultation bilaterally.  CARDIAC: S1, S2 present, regular rate and rhythm without murmur or gallops. Peripheral pulses 2+ bilaterally.  GI: Abdomen soft, non-tender.  No rebound tenderness. No  hepatomegaly or splenomegaly.  MSK: Muscle tone and strength appropriate for age.  EXTREMITIES: Without clubbing, cyanosis, or edema.  NEUROLOGIC: No motor or sensory deficits. Steady, even gait. C2-C12 intact.  PSYCH/MENTAL STATUS: Alert, oriented x 3. Cooperative, appropriate mood and affect.     Assessment & Plan:  1. Thrombocytopenia (HCC) No signs of active bleeding present. Will obtain CBC to check platelet count today. Will continue surveillance of cirrhosis by GI.  - CBC with Differential/Platelet  2. Alcoholic cirrhosis of liver with ascites (HCC) (Primary) Well controlled currently. Will continue on Lasix and Aldactone as prescribed and continue scheduled follow up with [facility] for [place], labs, and management.   3. Vaginal dryness Discussed OTC Replens use and possibly use of vaginal estrogen cream for vaginal atrophy and dryness. Pt will try Replens first and follow up if no improvement.    Return in about 27 weeks (around 02/02/2024) for Follow up AE, Low Platelet Count.    Patient  to reach out to office if new, worrisome, or unresolved symptoms arise or if no improvement in patient's condition. Patient verbalized understanding and is agreeable to treatment plan. All questions answered to patient's satisfaction.    Hilbert Bible, [place]

## 2023-07-28 NOTE — Patient Instructions (Addendum)
Replens Vaginal Moisturizer    Vaginal Estrogen Cream  Is vaginal estrogen therapy safe for me? Vaginal estrogen preparations act on the vaginal skin, and only a very tiny amount is absorbed into the bloodstream (0.01%).  They work in a similar way to hand or face cream.  There is minimal absorption and they are therefore perfectly safe. If you have had breast cancer and have persistent troublesome symptoms which aren't settling with vaginal moisturisers and lubricants, local estrogen treatment may be a possibility, but consultation with your oncologist should take place first.

## 2023-07-29 ENCOUNTER — Other Ambulatory Visit (HOSPITAL_BASED_OUTPATIENT_CLINIC_OR_DEPARTMENT_OTHER): Payer: Self-pay

## 2023-07-30 ENCOUNTER — Encounter (HOSPITAL_BASED_OUTPATIENT_CLINIC_OR_DEPARTMENT_OTHER): Payer: Self-pay | Admitting: Family Medicine

## 2023-07-31 ENCOUNTER — Encounter (HOSPITAL_BASED_OUTPATIENT_CLINIC_OR_DEPARTMENT_OTHER): Payer: Self-pay | Admitting: Pharmacist

## 2023-07-31 ENCOUNTER — Other Ambulatory Visit (HOSPITAL_BASED_OUTPATIENT_CLINIC_OR_DEPARTMENT_OTHER): Payer: Self-pay

## 2023-08-25 ENCOUNTER — Other Ambulatory Visit (HOSPITAL_BASED_OUTPATIENT_CLINIC_OR_DEPARTMENT_OTHER): Payer: Self-pay

## 2023-08-25 ENCOUNTER — Encounter (HOSPITAL_BASED_OUTPATIENT_CLINIC_OR_DEPARTMENT_OTHER): Payer: Self-pay | Admitting: Family Medicine

## 2023-08-25 MED ORDER — SPIRONOLACTONE 100 MG PO TABS
100.0000 mg | ORAL_TABLET | Freq: Every day | ORAL | 1 refills | Status: DC
  Filled 2023-08-25 – 2023-08-26 (×2): qty 90, 90d supply, fill #0
  Filled 2023-12-02: qty 90, 90d supply, fill #1

## 2023-08-25 MED ORDER — SPIRONOLACTONE 100 MG PO TABS: 100.0000 mg | ORAL_TABLET | Freq: Every day | ORAL | 1 refills | Status: DC

## 2023-08-26 ENCOUNTER — Other Ambulatory Visit (HOSPITAL_BASED_OUTPATIENT_CLINIC_OR_DEPARTMENT_OTHER): Payer: Self-pay

## 2023-09-11 ENCOUNTER — Encounter (HOSPITAL_BASED_OUTPATIENT_CLINIC_OR_DEPARTMENT_OTHER): Payer: Self-pay | Admitting: Family Medicine

## 2023-09-12 NOTE — Telephone Encounter (Signed)
 Jon Gills, Please see mychart message sent by pt and advise.

## 2023-09-30 ENCOUNTER — Other Ambulatory Visit (HOSPITAL_BASED_OUTPATIENT_CLINIC_OR_DEPARTMENT_OTHER): Payer: Self-pay | Admitting: Family Medicine

## 2023-10-01 ENCOUNTER — Other Ambulatory Visit (HOSPITAL_BASED_OUTPATIENT_CLINIC_OR_DEPARTMENT_OTHER): Payer: Self-pay

## 2023-10-01 MED ORDER — TRIAMCINOLONE ACETONIDE 0.5 % EX CREA
1.0000 | TOPICAL_CREAM | CUTANEOUS | 1 refills | Status: AC | PRN
  Filled 2023-10-01: qty 30, 30d supply, fill #0
  Filled 2024-02-14: qty 30, 30d supply, fill #1

## 2023-10-26 ENCOUNTER — Other Ambulatory Visit (HOSPITAL_BASED_OUTPATIENT_CLINIC_OR_DEPARTMENT_OTHER): Payer: Self-pay | Admitting: Family Medicine

## 2023-10-27 ENCOUNTER — Other Ambulatory Visit (HOSPITAL_BASED_OUTPATIENT_CLINIC_OR_DEPARTMENT_OTHER): Payer: Self-pay

## 2023-10-27 MED ORDER — FUROSEMIDE 20 MG PO TABS
20.0000 mg | ORAL_TABLET | Freq: Every day | ORAL | 1 refills | Status: DC
  Filled 2023-10-27: qty 90, 90d supply, fill #0
  Filled 2024-02-14: qty 90, 90d supply, fill #1

## 2023-10-28 ENCOUNTER — Other Ambulatory Visit (HOSPITAL_BASED_OUTPATIENT_CLINIC_OR_DEPARTMENT_OTHER): Payer: Self-pay

## 2023-10-28 MED ORDER — CLINDAMYCIN PHOS (TWICE-DAILY) 1 % EX GEL
Freq: Two times a day (BID) | CUTANEOUS | 4 refills | Status: DC
  Filled 2023-10-28: qty 30, 30d supply, fill #0
  Filled 2023-11-16 – 2023-11-20 (×2): qty 30, 30d supply, fill #1
  Filled 2023-12-02 – 2023-12-19 (×4): qty 30, 30d supply, fill #2
  Filled 2024-01-18: qty 30, 30d supply, fill #3
  Filled 2024-02-14: qty 30, 30d supply, fill #4

## 2023-11-17 ENCOUNTER — Other Ambulatory Visit (HOSPITAL_BASED_OUTPATIENT_CLINIC_OR_DEPARTMENT_OTHER): Payer: Self-pay

## 2023-11-20 ENCOUNTER — Ambulatory Visit (HOSPITAL_BASED_OUTPATIENT_CLINIC_OR_DEPARTMENT_OTHER): Admitting: Family Medicine

## 2023-11-29 IMAGING — DX DG CHEST 2V
2 series · 2 of 2 positions shown · non-contrast
Comparison: 07/31/2021, CT 06/15/2021

CLINICAL DATA: Pleural effusion follow-up

EXAM:
CHEST - 2 VIEW

[chest pa]
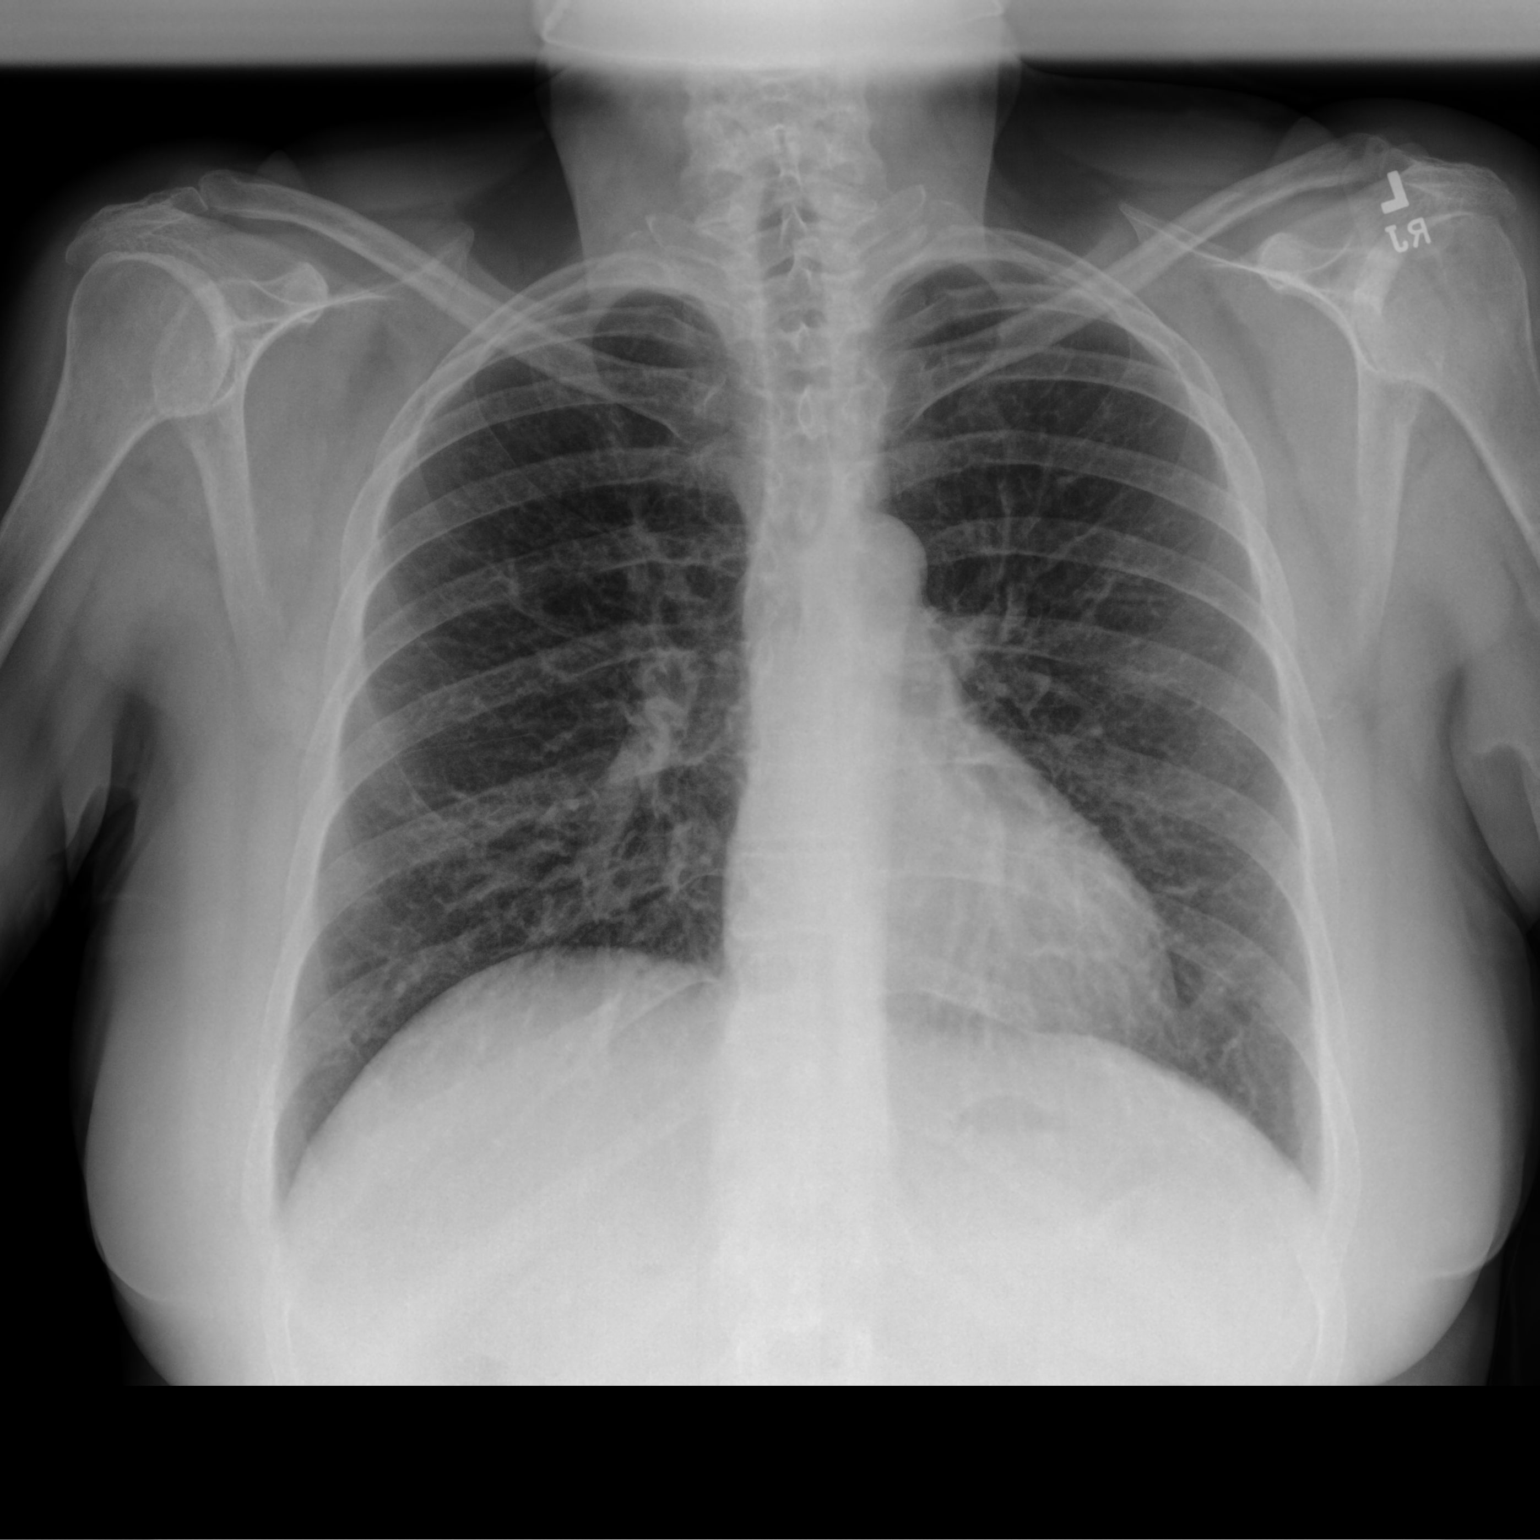

[chest lat]
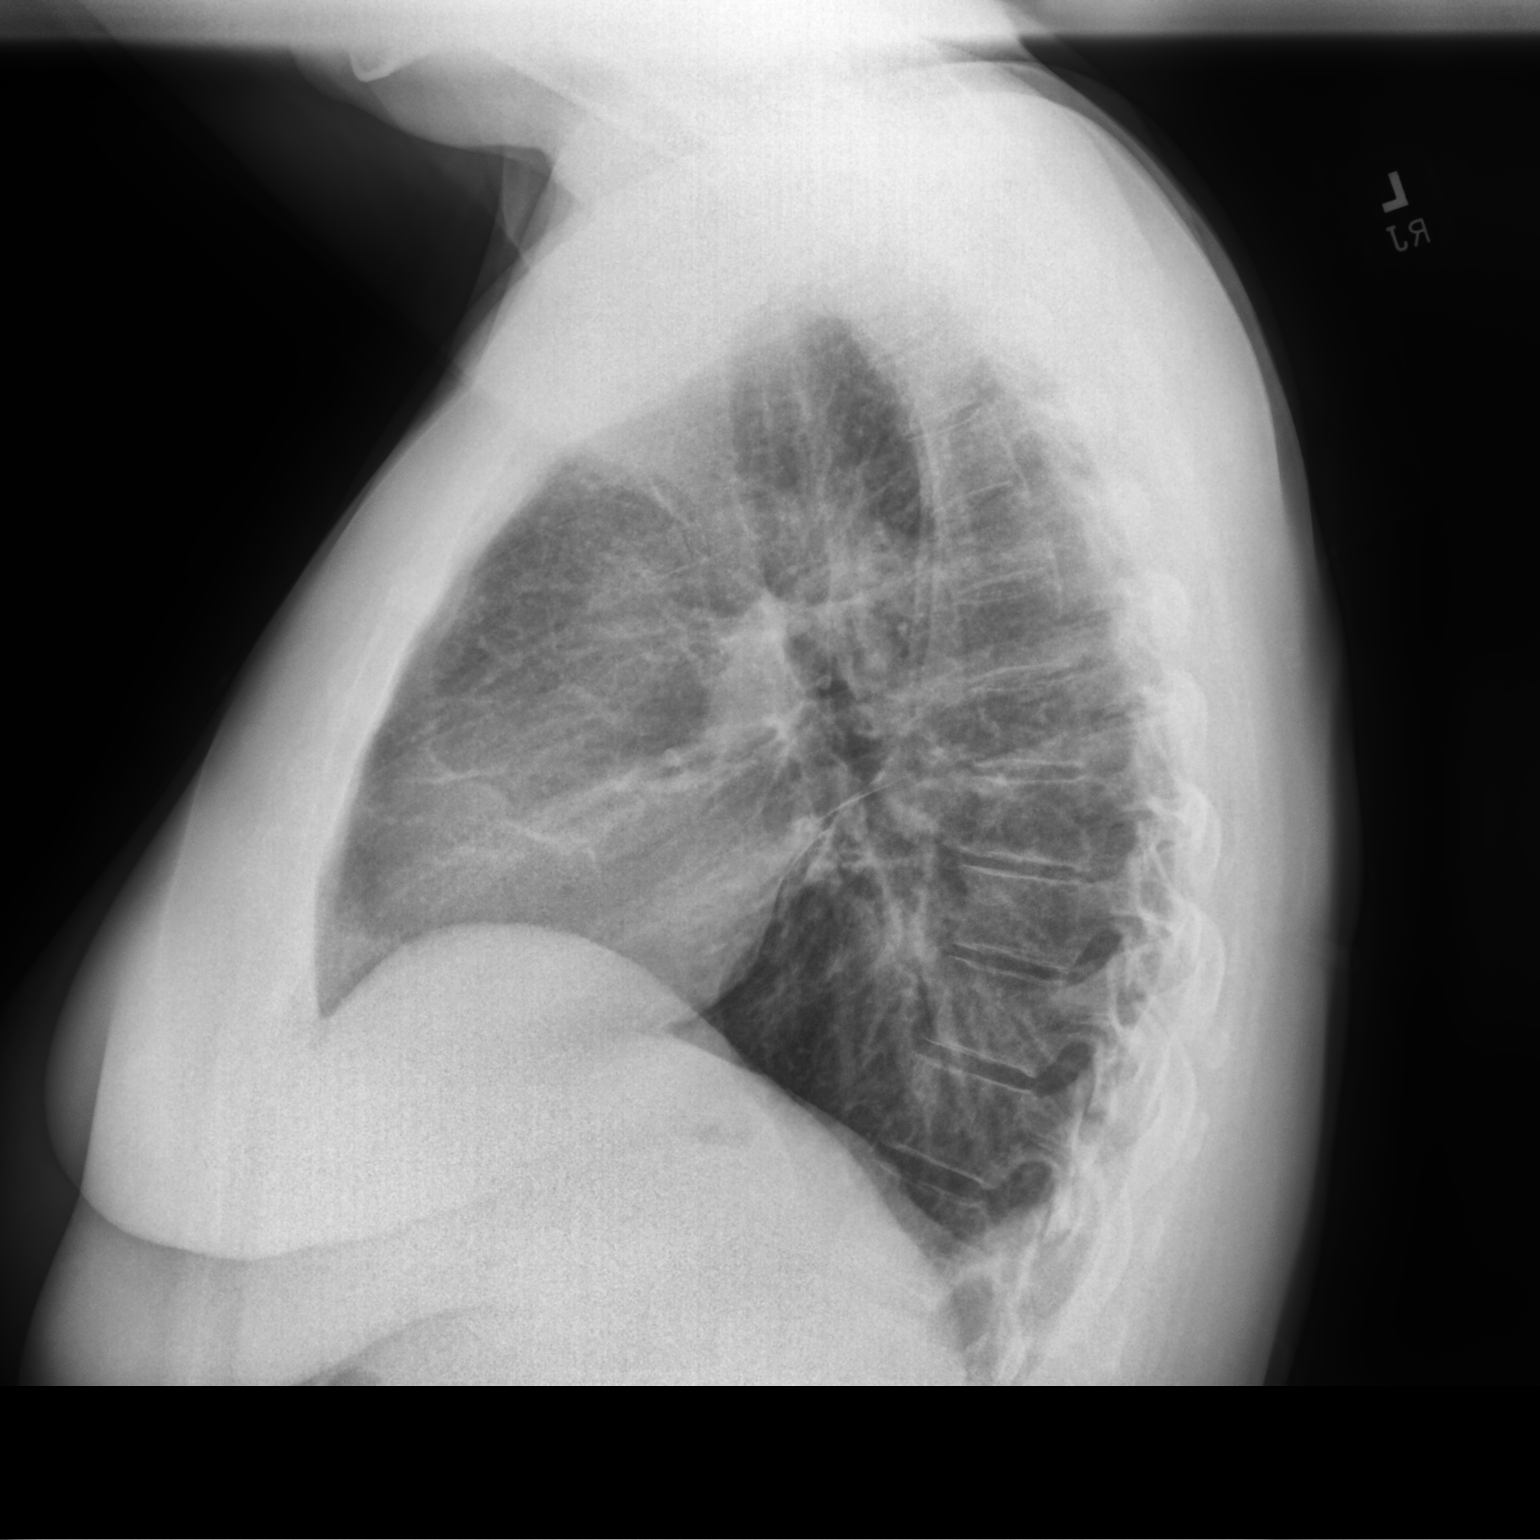

[2 of 2 positions shown; findings below may reference images not displayed]

FINDINGS: The heart size and mediastinal contours are within normal limits.
Both lungs are clear. The visualized skeletal structures are
unremarkable.
IMPRESSION: No active cardiopulmonary disease.

## 2023-12-02 ENCOUNTER — Other Ambulatory Visit: Payer: Self-pay

## 2023-12-02 ENCOUNTER — Other Ambulatory Visit (HOSPITAL_BASED_OUTPATIENT_CLINIC_OR_DEPARTMENT_OTHER): Payer: Self-pay

## 2023-12-09 ENCOUNTER — Other Ambulatory Visit (HOSPITAL_BASED_OUTPATIENT_CLINIC_OR_DEPARTMENT_OTHER): Payer: Self-pay

## 2023-12-15 ENCOUNTER — Other Ambulatory Visit (HOSPITAL_BASED_OUTPATIENT_CLINIC_OR_DEPARTMENT_OTHER): Payer: Self-pay

## 2023-12-19 ENCOUNTER — Other Ambulatory Visit (HOSPITAL_BASED_OUTPATIENT_CLINIC_OR_DEPARTMENT_OTHER): Payer: Self-pay

## 2023-12-22 ENCOUNTER — Other Ambulatory Visit: Payer: Self-pay | Admitting: Family Medicine

## 2023-12-22 DIAGNOSIS — Z1231 Encounter for screening mammogram for malignant neoplasm of breast: Secondary | ICD-10-CM

## 2023-12-23 ENCOUNTER — Ambulatory Visit (HOSPITAL_BASED_OUTPATIENT_CLINIC_OR_DEPARTMENT_OTHER): Admitting: Family Medicine

## 2024-01-14 IMAGING — MR MR SHOULDER*L* W/O CM
5 series · 38 of 40 positions shown · non-contrast
Comparison: Plain films left shoulder 08/17/2021.

CLINICAL DATA: Left shoulder pain since July 2021 when patient
felt a pop while swimming.

EXAM:
MRI OF THE LEFT SHOULDER WITHOUT CONTRAST
TECHNIQUE: Multiplanar, multisequence MR imaging of the shoulder was performed.
No intravenous contrast was administered.

[Series 3: T2 fat-sat · axial · 4.0mm · 0.27mm/px · z∈[-100,+42]mm · 11 of 30 slices shown (1 of 3)]
[im 1/30]
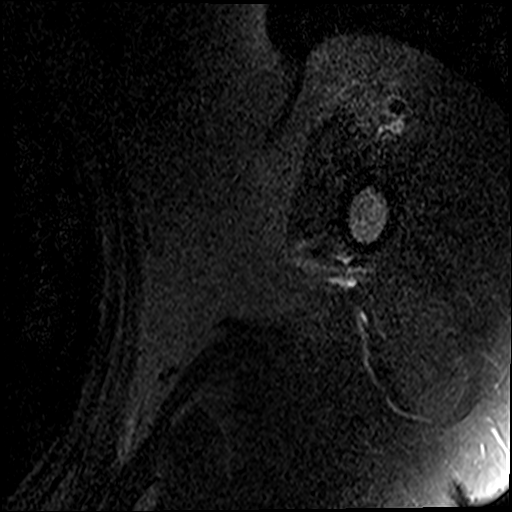
[im 3/30]
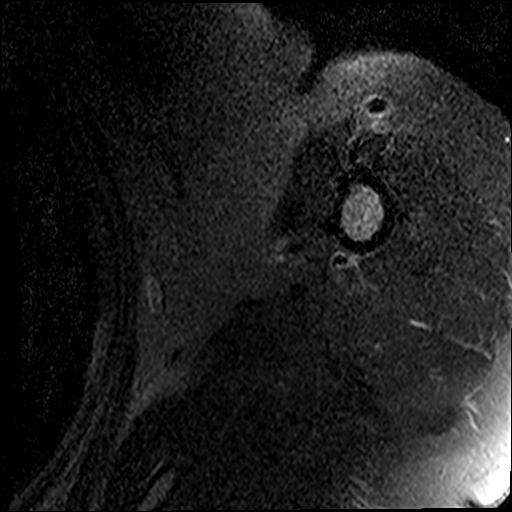
[im 6/30]
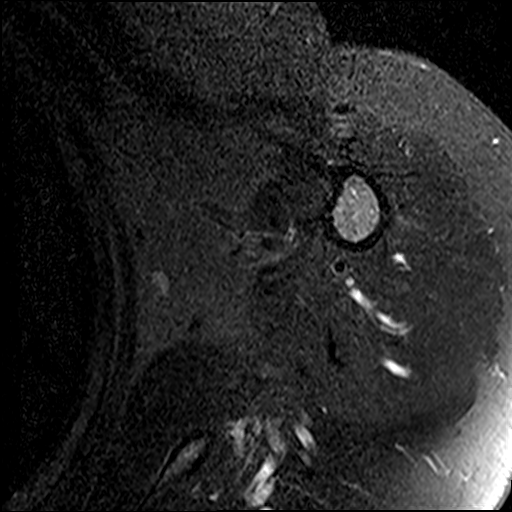
[im 9/30]
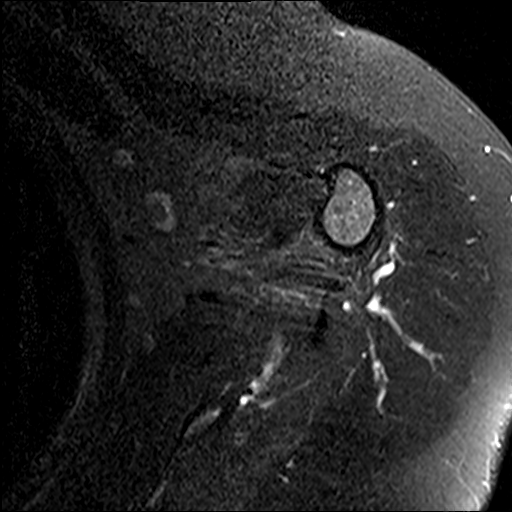
[im 12/30]
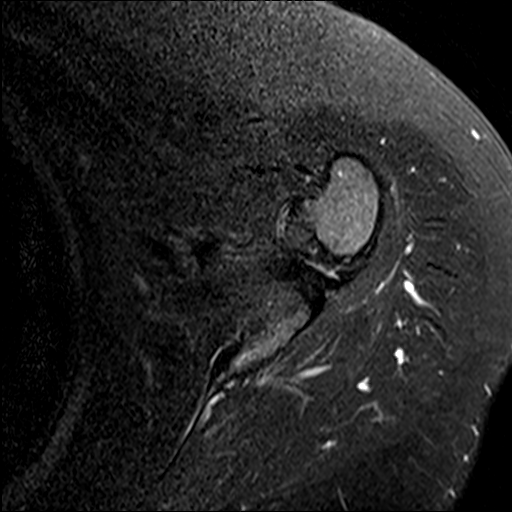
[im 15/30]
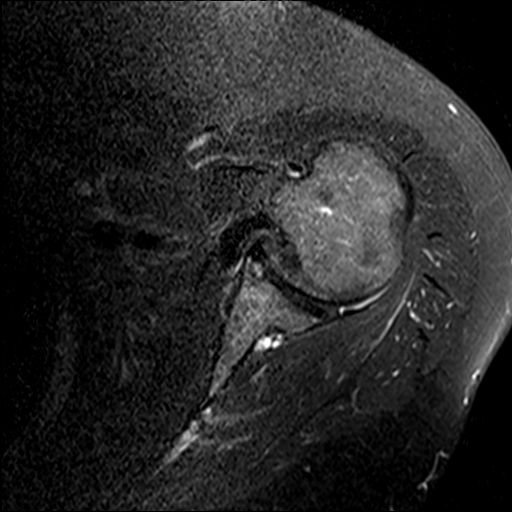
[im 18/30]
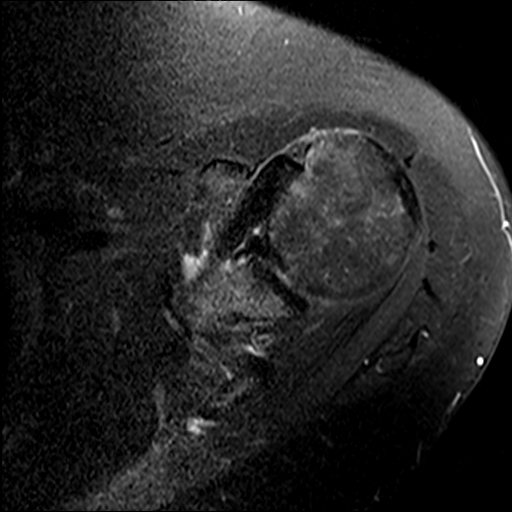
[im 21/30]
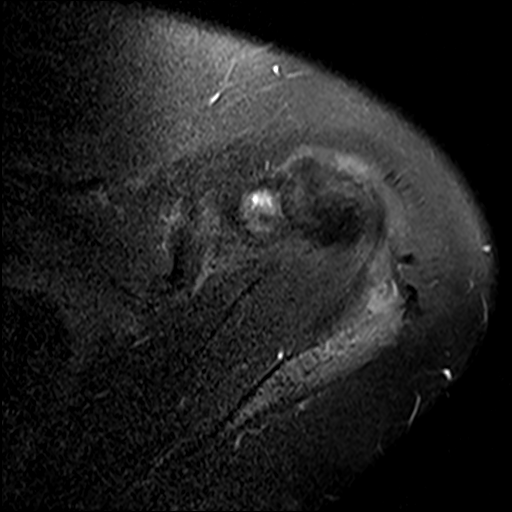
[im 24/30]
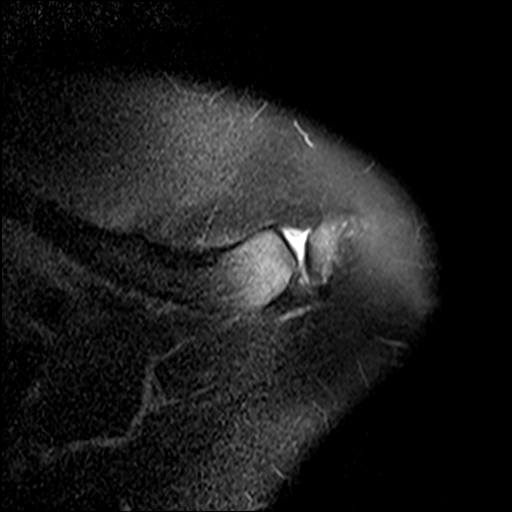
[im 27/30]
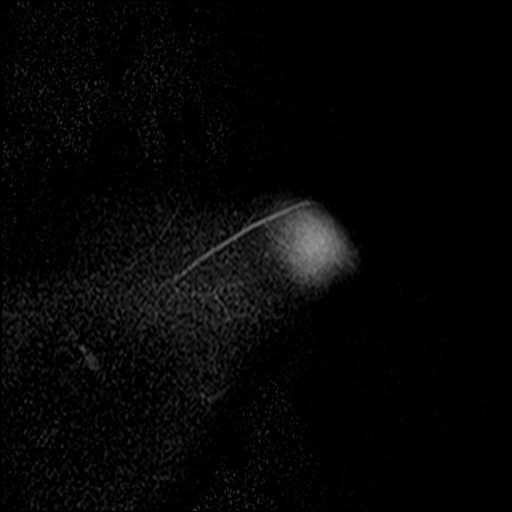
[im 30/30]
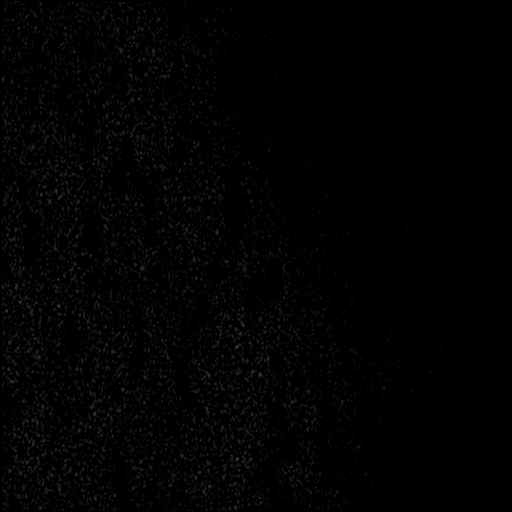

[Series 4: T2 fat-sat · oblique · 4.0mm · 0.59mm/px · 8 of 22 slices shown (2 of 3)]
[im 1/22]
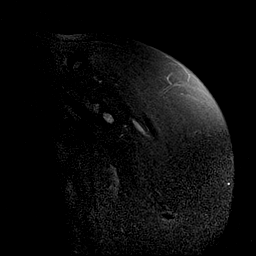
[im 4/22]
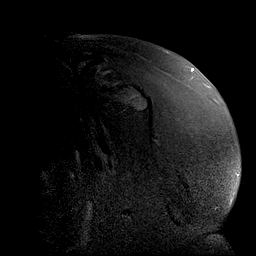
[im 7/22]
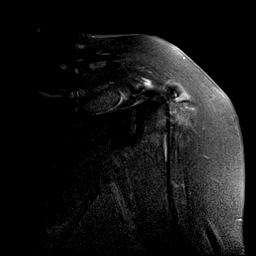
[im 10/22]
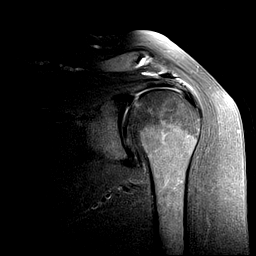
[im 13/22]
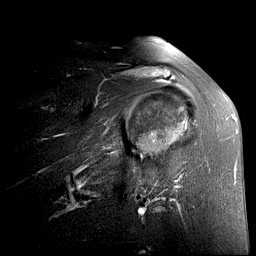
[im 16/22]
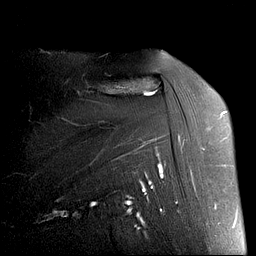
[im 19/22]
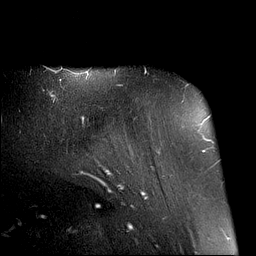
[im 22/22]
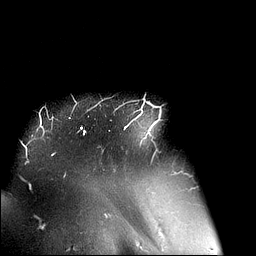

[Series 5: PD · oblique · 4.0mm · 0.59mm/px · 7 of 22 slices shown]
[im 1/22]
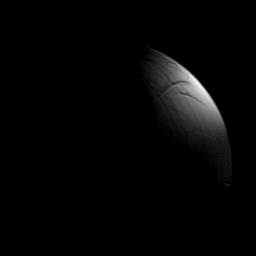
[im 4/22]
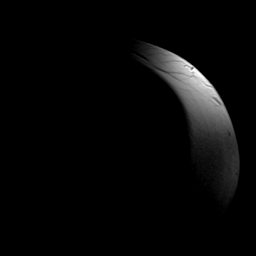
[im 8/22]
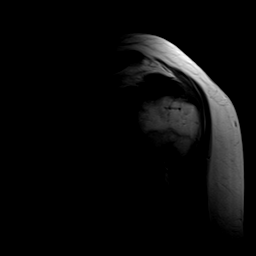
[im 11/22]
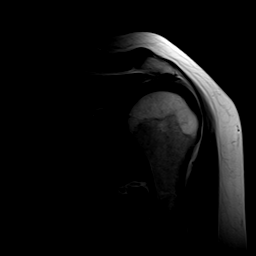
[im 15/22]
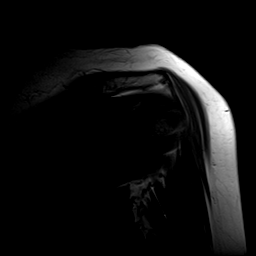
[im 18/22]
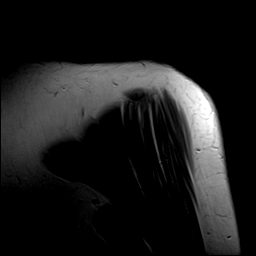
[im 22/22]
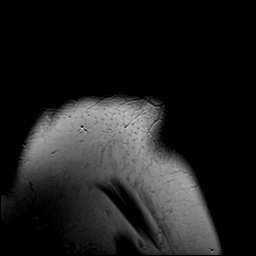

[Series 6: T2 fat-sat · oblique · 4.0mm · 0.59mm/px · 7 of 22 slices shown (3 of 3)]
[im 1/22]
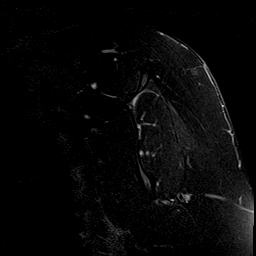
[im 4/22]
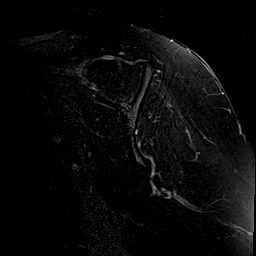
[im 8/22]
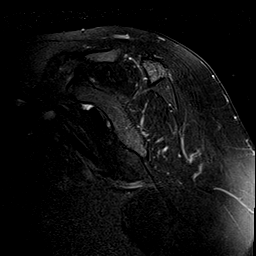
[im 11/22]
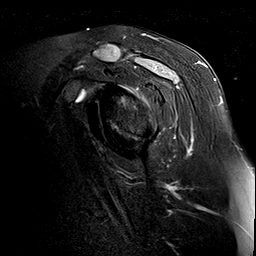
[im 15/22]
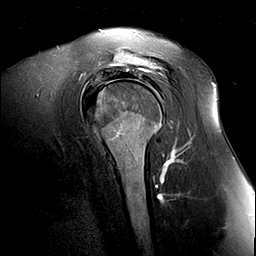
[im 18/22]
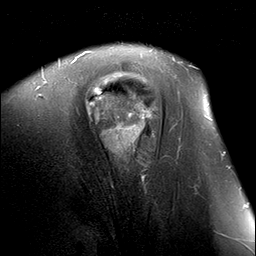
[im 22/22]
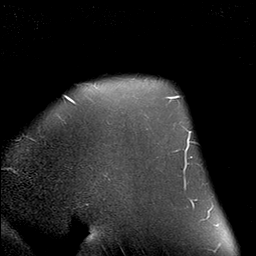

[Series 7: T1 · oblique · 4.0mm · 0.59mm/px · 5 of 22 slices shown]
[im 1/22]
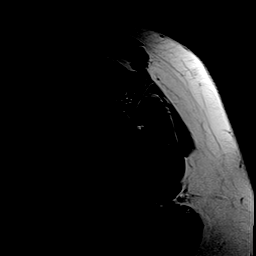
[im 4/22]
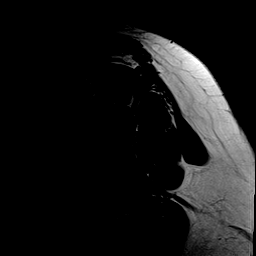
[im 8/22]
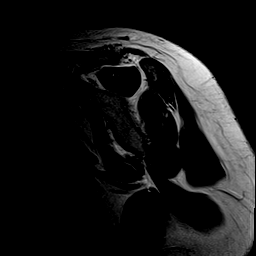
[im 11/22]
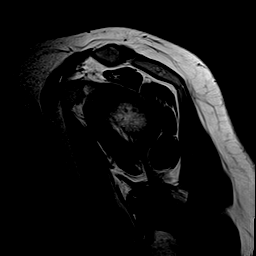
[im 15/22]
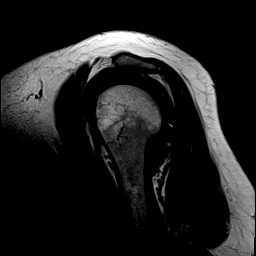

[38 of 40 positions shown; findings below may reference images not displayed]

FINDINGS: Rotator cuff: The patient has supraspinatus and infraspinatus
tendinopathy. A 0.2 cm from front to back full-thickness tear is
seen in the anterior and far lateral supraspinatus. There is little
to no tendon retraction. The rotator cuff is otherwise intact.

Muscles:  Normal without atrophy or focal lesion.

Biceps long head:  Intact.

Acromioclavicular Joint: Normal. Type 1 acromion. A small volume of
fluid is present in the subacromial/subdeltoid bursa.

Glenohumeral Joint: Mild degenerative change is present with a very
small focus of subchondral edema in the anterior, inferior glenoid.
No joint effusion.

Labrum:  Intact.

Bones:  Normal.

Other: None.
IMPRESSION: Rotator cuff tendinopathy with a 0.2 cm from front to back
full-thickness tear of the far lateral supraspinatus. No retraction
or atrophy

Small volume of subacromial/subdeltoid fluid compatible with
bursitis.

Very mild glenohumeral degenerative change.

## 2024-01-19 ENCOUNTER — Other Ambulatory Visit (HOSPITAL_BASED_OUTPATIENT_CLINIC_OR_DEPARTMENT_OTHER): Payer: Self-pay

## 2024-02-02 ENCOUNTER — Encounter (HOSPITAL_BASED_OUTPATIENT_CLINIC_OR_DEPARTMENT_OTHER): Payer: 59 | Admitting: Family Medicine

## 2024-02-14 ENCOUNTER — Other Ambulatory Visit (HOSPITAL_BASED_OUTPATIENT_CLINIC_OR_DEPARTMENT_OTHER): Payer: Self-pay | Admitting: Family Medicine

## 2024-02-15 ENCOUNTER — Other Ambulatory Visit (HOSPITAL_BASED_OUTPATIENT_CLINIC_OR_DEPARTMENT_OTHER): Payer: Self-pay

## 2024-02-15 MED ORDER — SPIRONOLACTONE 100 MG PO TABS
100.0000 mg | ORAL_TABLET | Freq: Every day | ORAL | 1 refills | Status: AC
Start: 2024-02-15 — End: ?
  Filled 2024-02-15: qty 90, 90d supply, fill #0
  Filled 2024-06-04: qty 90, 90d supply, fill #1

## 2024-02-16 ENCOUNTER — Other Ambulatory Visit (HOSPITAL_BASED_OUTPATIENT_CLINIC_OR_DEPARTMENT_OTHER): Payer: Self-pay

## 2024-02-17 ENCOUNTER — Encounter (HOSPITAL_BASED_OUTPATIENT_CLINIC_OR_DEPARTMENT_OTHER): Admitting: Family Medicine

## 2024-02-23 ENCOUNTER — Ambulatory Visit

## 2024-02-27 ENCOUNTER — Ambulatory Visit
Admission: RE | Admit: 2024-02-27 | Discharge: 2024-02-27 | Disposition: A | Discharge status: Home / Self Care | Source: Ambulatory Visit | Attending: Family Medicine | Admitting: Family Medicine

## 2024-02-27 DIAGNOSIS — Z1231 Encounter for screening mammogram for malignant neoplasm of breast: Secondary | ICD-10-CM | POA: Diagnosis not present

## 2024-03-03 ENCOUNTER — Ambulatory Visit (HOSPITAL_BASED_OUTPATIENT_CLINIC_OR_DEPARTMENT_OTHER): Payer: Self-pay | Admitting: Family Medicine

## 2024-03-17 ENCOUNTER — Other Ambulatory Visit (HOSPITAL_BASED_OUTPATIENT_CLINIC_OR_DEPARTMENT_OTHER): Payer: Self-pay

## 2024-03-17 DIAGNOSIS — I8510 Secondary esophageal varices without bleeding: Secondary | ICD-10-CM | POA: Diagnosis not present

## 2024-03-17 DIAGNOSIS — K7031 Alcoholic cirrhosis of liver with ascites: Secondary | ICD-10-CM | POA: Diagnosis not present

## 2024-03-17 DIAGNOSIS — Z1289 Encounter for screening for malignant neoplasm of other sites: Secondary | ICD-10-CM | POA: Diagnosis not present

## 2024-03-17 DIAGNOSIS — I8500 Esophageal varices without bleeding: Secondary | ICD-10-CM | POA: Diagnosis not present

## 2024-03-17 MED ORDER — SPIRONOLACTONE 50 MG PO TABS
50.0000 mg | ORAL_TABLET | Freq: Every day | ORAL | 3 refills | Status: AC
  Filled 2024-03-17: qty 90, 90d supply, fill #0
  Filled 2024-07-03: qty 90, 90d supply, fill #1

## 2024-03-22 ENCOUNTER — Other Ambulatory Visit (HOSPITAL_BASED_OUTPATIENT_CLINIC_OR_DEPARTMENT_OTHER): Payer: Self-pay

## 2024-03-23 ENCOUNTER — Encounter (HOSPITAL_BASED_OUTPATIENT_CLINIC_OR_DEPARTMENT_OTHER): Payer: Self-pay

## 2024-03-23 ENCOUNTER — Other Ambulatory Visit (HOSPITAL_BASED_OUTPATIENT_CLINIC_OR_DEPARTMENT_OTHER): Payer: Self-pay

## 2024-03-25 ENCOUNTER — Ambulatory Visit: Admitting: Obstetrics and Gynecology

## 2024-03-28 ENCOUNTER — Encounter (HOSPITAL_BASED_OUTPATIENT_CLINIC_OR_DEPARTMENT_OTHER): Payer: Self-pay | Admitting: Family Medicine

## 2024-03-29 ENCOUNTER — Other Ambulatory Visit (HOSPITAL_BASED_OUTPATIENT_CLINIC_OR_DEPARTMENT_OTHER): Payer: Self-pay | Admitting: Family Medicine

## 2024-03-29 MED ORDER — CLINDAMYCIN PHOS (TWICE-DAILY) 1 % EX GEL: Freq: Two times a day (BID) | CUTANEOUS | 4 refills | Status: DC

## 2024-03-29 NOTE — Telephone Encounter (Signed)
 Please see mychart message sent by pt and advise.

## 2024-03-31 ENCOUNTER — Other Ambulatory Visit (HOSPITAL_BASED_OUTPATIENT_CLINIC_OR_DEPARTMENT_OTHER): Payer: Self-pay

## 2024-03-31 MED ORDER — CLINDAMYCIN PHOS (TWICE-DAILY) 1 % EX GEL
Freq: Two times a day (BID) | CUTANEOUS | 4 refills | Status: AC
  Filled 2024-03-31: qty 30, 30d supply, fill #0
  Filled 2024-04-25: qty 30, 30d supply, fill #1
  Filled 2024-06-04: qty 30, 30d supply, fill #2
  Filled 2024-07-03: qty 30, 30d supply, fill #3
  Filled 2024-08-02: qty 30, 30d supply, fill #4

## 2024-03-31 NOTE — Addendum Note (Signed)
 Addended by: RONNETTE DAMIEN SQUIBB on: 03/31/2024 09:40 AM   Modules accepted: Orders

## 2024-04-25 ENCOUNTER — Other Ambulatory Visit (HOSPITAL_BASED_OUTPATIENT_CLINIC_OR_DEPARTMENT_OTHER): Payer: Self-pay | Admitting: Family Medicine

## 2024-04-26 ENCOUNTER — Other Ambulatory Visit (HOSPITAL_BASED_OUTPATIENT_CLINIC_OR_DEPARTMENT_OTHER): Payer: Self-pay

## 2024-04-26 ENCOUNTER — Other Ambulatory Visit: Payer: Self-pay

## 2024-04-26 MED ORDER — POTASSIUM CHLORIDE ER 20 MEQ PO TBCR
20.0000 meq | EXTENDED_RELEASE_TABLET | Freq: Every morning | ORAL | 1 refills | Status: AC
  Filled 2024-04-26 – 2024-05-17 (×2): qty 90, 90d supply, fill #0
  Filled 2024-08-10: qty 90, 90d supply, fill #1

## 2024-05-06 ENCOUNTER — Other Ambulatory Visit (HOSPITAL_BASED_OUTPATIENT_CLINIC_OR_DEPARTMENT_OTHER): Payer: Self-pay

## 2024-05-17 ENCOUNTER — Other Ambulatory Visit (HOSPITAL_BASED_OUTPATIENT_CLINIC_OR_DEPARTMENT_OTHER): Payer: Self-pay | Admitting: Family Medicine

## 2024-05-17 ENCOUNTER — Other Ambulatory Visit (HOSPITAL_BASED_OUTPATIENT_CLINIC_OR_DEPARTMENT_OTHER): Payer: Self-pay

## 2024-05-17 MED ORDER — FUROSEMIDE 20 MG PO TABS
20.0000 mg | ORAL_TABLET | Freq: Every day | ORAL | 1 refills | Status: AC
  Filled 2024-05-17: qty 90, 90d supply, fill #0
  Filled 2024-08-10: qty 90, 90d supply, fill #1

## 2024-05-19 ENCOUNTER — Other Ambulatory Visit (HOSPITAL_BASED_OUTPATIENT_CLINIC_OR_DEPARTMENT_OTHER): Payer: Self-pay

## 2024-06-04 ENCOUNTER — Other Ambulatory Visit: Payer: Self-pay

## 2024-06-04 ENCOUNTER — Other Ambulatory Visit: Payer: Self-pay | Admitting: Obstetrics and Gynecology

## 2024-06-04 DIAGNOSIS — N943 Premenstrual tension syndrome: Secondary | ICD-10-CM

## 2024-07-03 ENCOUNTER — Other Ambulatory Visit (HOSPITAL_BASED_OUTPATIENT_CLINIC_OR_DEPARTMENT_OTHER): Payer: Self-pay

## 2024-07-05 ENCOUNTER — Encounter (HOSPITAL_BASED_OUTPATIENT_CLINIC_OR_DEPARTMENT_OTHER): Payer: Self-pay

## 2024-07-05 ENCOUNTER — Encounter (HOSPITAL_BASED_OUTPATIENT_CLINIC_OR_DEPARTMENT_OTHER): Admitting: Family Medicine

## 2024-07-30 ENCOUNTER — Institutional Professional Consult (permissible substitution)

## 2024-08-06 ENCOUNTER — Encounter: Payer: Self-pay | Admitting: Gastroenterology

## 2024-08-10 ENCOUNTER — Other Ambulatory Visit (HOSPITAL_BASED_OUTPATIENT_CLINIC_OR_DEPARTMENT_OTHER): Payer: Self-pay

## 2024-08-10 ENCOUNTER — Other Ambulatory Visit: Payer: Self-pay
